# Patient Record
Sex: Female | Born: 1960
Health system: Southern US, Community
[De-identification: ages and names within clinical notes are randomized; demographics above are authoritative.]

## PROBLEM LIST (undated history)

## (undated) DIAGNOSIS — I2510 Atherosclerotic heart disease of native coronary artery without angina pectoris: Secondary | ICD-10-CM

## (undated) DIAGNOSIS — F32A Depression, unspecified: Secondary | ICD-10-CM

## (undated) DIAGNOSIS — E782 Mixed hyperlipidemia: Secondary | ICD-10-CM

## (undated) DIAGNOSIS — E119 Type 2 diabetes mellitus without complications: Secondary | ICD-10-CM

## (undated) DIAGNOSIS — I472 Ventricular tachycardia, unspecified: Secondary | ICD-10-CM

## (undated) DIAGNOSIS — I509 Heart failure, unspecified: Secondary | ICD-10-CM

## (undated) DIAGNOSIS — Z87891 Personal history of nicotine dependence: Secondary | ICD-10-CM

## (undated) DIAGNOSIS — I9589 Other hypotension: Secondary | ICD-10-CM

## (undated) DIAGNOSIS — I10 Essential (primary) hypertension: Secondary | ICD-10-CM

## (undated) DIAGNOSIS — Z95810 Presence of automatic (implantable) cardiac defibrillator: Secondary | ICD-10-CM

## (undated) DIAGNOSIS — F329 Major depressive disorder, single episode, unspecified: Secondary | ICD-10-CM

## (undated) DIAGNOSIS — K219 Gastro-esophageal reflux disease without esophagitis: Secondary | ICD-10-CM

## (undated) HISTORY — DX: Depression, unspecified: F32.A

## (undated) HISTORY — DX: Heart failure, unspecified: I50.9

## (undated) HISTORY — DX: Mixed hyperlipidemia: E78.2

## (undated) HISTORY — DX: Other hypotension: I95.89

## (undated) HISTORY — PX: CATARACT EXTRACTION: SUR2

## (undated) HISTORY — PX: ABDOMINAL HYSTERECTOMY: SHX81

## (undated) HISTORY — DX: Ventricular tachycardia: I47.2

## (undated) HISTORY — DX: Atherosclerotic heart disease of native coronary artery without angina pectoris: I25.10

## (undated) HISTORY — DX: Gastro-esophageal reflux disease without esophagitis: K21.9

## (undated) HISTORY — DX: Ventricular tachycardia, unspecified: I47.20

---
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
## 2016-01-25 DIAGNOSIS — E559 Vitamin D deficiency, unspecified: Secondary | ICD-10-CM | POA: Diagnosis not present

## 2016-01-25 DIAGNOSIS — Z0189 Encounter for other specified special examinations: Secondary | ICD-10-CM | POA: Diagnosis not present

## 2016-01-25 DIAGNOSIS — E782 Mixed hyperlipidemia: Secondary | ICD-10-CM | POA: Diagnosis not present

## 2016-01-25 DIAGNOSIS — I10 Essential (primary) hypertension: Secondary | ICD-10-CM | POA: Diagnosis not present

## 2016-01-25 DIAGNOSIS — E119 Type 2 diabetes mellitus without complications: Secondary | ICD-10-CM | POA: Diagnosis not present

## 2016-06-01 DIAGNOSIS — H04123 Dry eye syndrome of bilateral lacrimal glands: Secondary | ICD-10-CM | POA: Diagnosis not present

## 2016-06-01 DIAGNOSIS — Z961 Presence of intraocular lens: Secondary | ICD-10-CM | POA: Diagnosis not present

## 2016-06-01 DIAGNOSIS — E113392 Type 2 diabetes mellitus with moderate nonproliferative diabetic retinopathy without macular edema, left eye: Secondary | ICD-10-CM | POA: Diagnosis not present

## 2016-06-01 DIAGNOSIS — E113311 Type 2 diabetes mellitus with moderate nonproliferative diabetic retinopathy with macular edema, right eye: Secondary | ICD-10-CM | POA: Diagnosis not present

## 2016-10-18 DIAGNOSIS — H25043 Posterior subcapsular polar age-related cataract, bilateral: Secondary | ICD-10-CM | POA: Diagnosis not present

## 2016-10-18 DIAGNOSIS — E113311 Type 2 diabetes mellitus with moderate nonproliferative diabetic retinopathy with macular edema, right eye: Secondary | ICD-10-CM | POA: Diagnosis not present

## 2016-10-18 DIAGNOSIS — E113392 Type 2 diabetes mellitus with moderate nonproliferative diabetic retinopathy without macular edema, left eye: Secondary | ICD-10-CM | POA: Diagnosis not present

## 2017-02-01 DIAGNOSIS — L239 Allergic contact dermatitis, unspecified cause: Secondary | ICD-10-CM | POA: Diagnosis not present

## 2017-02-01 DIAGNOSIS — E119 Type 2 diabetes mellitus without complications: Secondary | ICD-10-CM | POA: Diagnosis not present

## 2017-02-01 DIAGNOSIS — E1165 Type 2 diabetes mellitus with hyperglycemia: Secondary | ICD-10-CM | POA: Diagnosis not present

## 2017-02-01 DIAGNOSIS — R5383 Other fatigue: Secondary | ICD-10-CM | POA: Diagnosis not present

## 2017-02-01 DIAGNOSIS — J060 Acute laryngopharyngitis: Secondary | ICD-10-CM | POA: Diagnosis not present

## 2017-03-14 DIAGNOSIS — H04123 Dry eye syndrome of bilateral lacrimal glands: Secondary | ICD-10-CM | POA: Diagnosis not present

## 2017-03-14 DIAGNOSIS — E113311 Type 2 diabetes mellitus with moderate nonproliferative diabetic retinopathy with macular edema, right eye: Secondary | ICD-10-CM | POA: Diagnosis not present

## 2017-03-14 DIAGNOSIS — Z961 Presence of intraocular lens: Secondary | ICD-10-CM | POA: Diagnosis not present

## 2017-03-14 DIAGNOSIS — E113392 Type 2 diabetes mellitus with moderate nonproliferative diabetic retinopathy without macular edema, left eye: Secondary | ICD-10-CM | POA: Diagnosis not present

## 2017-04-05 DIAGNOSIS — E113493 Type 2 diabetes mellitus with severe nonproliferative diabetic retinopathy without macular edema, bilateral: Secondary | ICD-10-CM | POA: Diagnosis not present

## 2017-05-24 DIAGNOSIS — E083413 Diabetes mellitus due to underlying condition with severe nonproliferative diabetic retinopathy with macular edema, bilateral: Secondary | ICD-10-CM | POA: Diagnosis not present

## 2017-05-24 DIAGNOSIS — E1165 Type 2 diabetes mellitus with hyperglycemia: Secondary | ICD-10-CM | POA: Diagnosis not present

## 2017-05-24 DIAGNOSIS — E782 Mixed hyperlipidemia: Secondary | ICD-10-CM | POA: Diagnosis not present

## 2017-05-24 DIAGNOSIS — K219 Gastro-esophageal reflux disease without esophagitis: Secondary | ICD-10-CM | POA: Diagnosis not present

## 2017-05-24 DIAGNOSIS — F17200 Nicotine dependence, unspecified, uncomplicated: Secondary | ICD-10-CM | POA: Diagnosis not present

## 2017-08-09 DIAGNOSIS — E113493 Type 2 diabetes mellitus with severe nonproliferative diabetic retinopathy without macular edema, bilateral: Secondary | ICD-10-CM | POA: Diagnosis not present

## 2017-08-22 DIAGNOSIS — Z23 Encounter for immunization: Secondary | ICD-10-CM | POA: Diagnosis not present

## 2017-08-22 DIAGNOSIS — F17200 Nicotine dependence, unspecified, uncomplicated: Secondary | ICD-10-CM | POA: Diagnosis not present

## 2017-08-22 DIAGNOSIS — Z1211 Encounter for screening for malignant neoplasm of colon: Secondary | ICD-10-CM | POA: Diagnosis not present

## 2017-08-22 DIAGNOSIS — Z0000 Encounter for general adult medical examination without abnormal findings: Secondary | ICD-10-CM | POA: Diagnosis not present

## 2017-08-22 DIAGNOSIS — Z1231 Encounter for screening mammogram for malignant neoplasm of breast: Secondary | ICD-10-CM | POA: Diagnosis not present

## 2017-09-04 DIAGNOSIS — E1165 Type 2 diabetes mellitus with hyperglycemia: Secondary | ICD-10-CM | POA: Diagnosis not present

## 2017-09-04 DIAGNOSIS — R202 Paresthesia of skin: Secondary | ICD-10-CM | POA: Diagnosis not present

## 2017-09-04 DIAGNOSIS — E782 Mixed hyperlipidemia: Secondary | ICD-10-CM | POA: Diagnosis not present

## 2017-09-04 DIAGNOSIS — F17200 Nicotine dependence, unspecified, uncomplicated: Secondary | ICD-10-CM | POA: Diagnosis not present

## 2017-09-04 DIAGNOSIS — K219 Gastro-esophageal reflux disease without esophagitis: Secondary | ICD-10-CM | POA: Diagnosis not present

## 2017-09-04 DIAGNOSIS — E083413 Diabetes mellitus due to underlying condition with severe nonproliferative diabetic retinopathy with macular edema, bilateral: Secondary | ICD-10-CM | POA: Diagnosis not present

## 2017-09-11 DIAGNOSIS — Z1231 Encounter for screening mammogram for malignant neoplasm of breast: Secondary | ICD-10-CM | POA: Diagnosis not present

## 2017-10-03 DIAGNOSIS — R921 Mammographic calcification found on diagnostic imaging of breast: Secondary | ICD-10-CM | POA: Diagnosis not present

## 2017-10-03 DIAGNOSIS — Z1231 Encounter for screening mammogram for malignant neoplasm of breast: Secondary | ICD-10-CM | POA: Diagnosis not present

## 2017-10-03 DIAGNOSIS — N6489 Other specified disorders of breast: Secondary | ICD-10-CM | POA: Diagnosis not present

## 2017-10-04 ENCOUNTER — Other Ambulatory Visit: Payer: Self-pay | Admitting: Family Medicine

## 2017-10-04 DIAGNOSIS — N6489 Other specified disorders of breast: Secondary | ICD-10-CM

## 2017-10-11 DIAGNOSIS — H5203 Hypermetropia, bilateral: Secondary | ICD-10-CM | POA: Diagnosis not present

## 2017-10-11 DIAGNOSIS — H04123 Dry eye syndrome of bilateral lacrimal glands: Secondary | ICD-10-CM | POA: Diagnosis not present

## 2017-10-11 DIAGNOSIS — E113493 Type 2 diabetes mellitus with severe nonproliferative diabetic retinopathy without macular edema, bilateral: Secondary | ICD-10-CM | POA: Diagnosis not present

## 2017-10-16 ENCOUNTER — Ambulatory Visit
Admission: RE | Admit: 2017-10-16 | Discharge: 2017-10-16 | Disposition: A | Discharge status: Home / Self Care | Payer: BLUE CROSS/BLUE SHIELD | Source: Ambulatory Visit | Attending: Family Medicine | Admitting: Family Medicine

## 2017-10-16 DIAGNOSIS — N6489 Other specified disorders of breast: Secondary | ICD-10-CM

## 2017-10-16 DIAGNOSIS — N6012 Diffuse cystic mastopathy of left breast: Secondary | ICD-10-CM | POA: Diagnosis not present

## 2017-11-28 DIAGNOSIS — E1165 Type 2 diabetes mellitus with hyperglycemia: Secondary | ICD-10-CM | POA: Diagnosis not present

## 2017-11-28 DIAGNOSIS — J018 Other acute sinusitis: Secondary | ICD-10-CM | POA: Diagnosis not present

## 2017-11-28 DIAGNOSIS — F4321 Adjustment disorder with depressed mood: Secondary | ICD-10-CM | POA: Diagnosis not present

## 2018-02-05 DIAGNOSIS — E1165 Type 2 diabetes mellitus with hyperglycemia: Secondary | ICD-10-CM | POA: Diagnosis not present

## 2018-02-05 DIAGNOSIS — K219 Gastro-esophageal reflux disease without esophagitis: Secondary | ICD-10-CM | POA: Diagnosis not present

## 2018-02-05 DIAGNOSIS — E083413 Diabetes mellitus due to underlying condition with severe nonproliferative diabetic retinopathy with macular edema, bilateral: Secondary | ICD-10-CM | POA: Diagnosis not present

## 2018-02-05 DIAGNOSIS — E782 Mixed hyperlipidemia: Secondary | ICD-10-CM | POA: Diagnosis not present

## 2018-03-08 DIAGNOSIS — F322 Major depressive disorder, single episode, severe without psychotic features: Secondary | ICD-10-CM | POA: Diagnosis not present

## 2018-03-08 DIAGNOSIS — E1165 Type 2 diabetes mellitus with hyperglycemia: Secondary | ICD-10-CM | POA: Diagnosis not present

## 2018-04-02 DIAGNOSIS — F320 Major depressive disorder, single episode, mild: Secondary | ICD-10-CM | POA: Diagnosis not present

## 2018-04-02 DIAGNOSIS — E1121 Type 2 diabetes mellitus with diabetic nephropathy: Secondary | ICD-10-CM | POA: Diagnosis not present

## 2018-08-02 DIAGNOSIS — E782 Mixed hyperlipidemia: Secondary | ICD-10-CM | POA: Diagnosis not present

## 2018-08-02 DIAGNOSIS — K219 Gastro-esophageal reflux disease without esophagitis: Secondary | ICD-10-CM | POA: Diagnosis not present

## 2018-08-02 DIAGNOSIS — I10 Essential (primary) hypertension: Secondary | ICD-10-CM | POA: Diagnosis not present

## 2018-08-02 DIAGNOSIS — E1121 Type 2 diabetes mellitus with diabetic nephropathy: Secondary | ICD-10-CM | POA: Diagnosis not present

## 2018-08-02 DIAGNOSIS — E083413 Diabetes mellitus due to underlying condition with severe nonproliferative diabetic retinopathy with macular edema, bilateral: Secondary | ICD-10-CM | POA: Diagnosis not present

## 2018-10-09 DIAGNOSIS — K219 Gastro-esophageal reflux disease without esophagitis: Secondary | ICD-10-CM | POA: Insufficient documentation

## 2018-10-09 DIAGNOSIS — I219 Acute myocardial infarction, unspecified: Secondary | ICD-10-CM

## 2018-10-09 HISTORY — DX: Acute myocardial infarction, unspecified: I21.9

## 2018-10-14 DIAGNOSIS — Z0000 Encounter for general adult medical examination without abnormal findings: Secondary | ICD-10-CM | POA: Diagnosis not present

## 2018-10-14 DIAGNOSIS — Z1211 Encounter for screening for malignant neoplasm of colon: Secondary | ICD-10-CM | POA: Diagnosis not present

## 2018-10-14 DIAGNOSIS — Z1231 Encounter for screening mammogram for malignant neoplasm of breast: Secondary | ICD-10-CM | POA: Diagnosis not present

## 2018-11-07 DIAGNOSIS — R920 Mammographic microcalcification found on diagnostic imaging of breast: Secondary | ICD-10-CM | POA: Diagnosis not present

## 2018-11-07 DIAGNOSIS — R928 Other abnormal and inconclusive findings on diagnostic imaging of breast: Secondary | ICD-10-CM | POA: Diagnosis not present

## 2018-11-12 DIAGNOSIS — I10 Essential (primary) hypertension: Secondary | ICD-10-CM | POA: Diagnosis not present

## 2018-11-12 DIAGNOSIS — E1121 Type 2 diabetes mellitus with diabetic nephropathy: Secondary | ICD-10-CM | POA: Diagnosis not present

## 2018-11-12 DIAGNOSIS — E083413 Diabetes mellitus due to underlying condition with severe nonproliferative diabetic retinopathy with macular edema, bilateral: Secondary | ICD-10-CM | POA: Diagnosis not present

## 2018-11-12 DIAGNOSIS — E782 Mixed hyperlipidemia: Secondary | ICD-10-CM | POA: Diagnosis not present

## 2018-11-12 DIAGNOSIS — K219 Gastro-esophageal reflux disease without esophagitis: Secondary | ICD-10-CM | POA: Diagnosis not present

## 2019-03-07 DIAGNOSIS — E1169 Type 2 diabetes mellitus with other specified complication: Secondary | ICD-10-CM | POA: Diagnosis not present

## 2019-03-07 DIAGNOSIS — E782 Mixed hyperlipidemia: Secondary | ICD-10-CM | POA: Diagnosis not present

## 2019-03-07 DIAGNOSIS — K219 Gastro-esophageal reflux disease without esophagitis: Secondary | ICD-10-CM | POA: Diagnosis not present

## 2019-03-07 DIAGNOSIS — E1121 Type 2 diabetes mellitus with diabetic nephropathy: Secondary | ICD-10-CM | POA: Diagnosis not present

## 2019-03-07 DIAGNOSIS — E083413 Diabetes mellitus due to underlying condition with severe nonproliferative diabetic retinopathy with macular edema, bilateral: Secondary | ICD-10-CM | POA: Diagnosis not present

## 2019-03-07 DIAGNOSIS — I10 Essential (primary) hypertension: Secondary | ICD-10-CM | POA: Diagnosis not present

## 2019-03-10 ENCOUNTER — Other Ambulatory Visit: Payer: Self-pay

## 2019-03-10 ENCOUNTER — Encounter (HOSPITAL_COMMUNITY): Payer: Self-pay

## 2019-03-10 ENCOUNTER — Inpatient Hospital Stay (HOSPITAL_COMMUNITY)
Admission: AD | Admit: 2019-03-10 | Discharge: 2019-03-15 | DRG: 224 | Disposition: A | Discharge status: Home / Self Care | Payer: BC Managed Care – PPO | Source: Other Acute Inpatient Hospital | Attending: Cardiology | Admitting: Cardiology

## 2019-03-10 DIAGNOSIS — I2510 Atherosclerotic heart disease of native coronary artery without angina pectoris: Secondary | ICD-10-CM | POA: Diagnosis not present

## 2019-03-10 DIAGNOSIS — I255 Ischemic cardiomyopathy: Secondary | ICD-10-CM | POA: Diagnosis not present

## 2019-03-10 DIAGNOSIS — Z885 Allergy status to narcotic agent status: Secondary | ICD-10-CM | POA: Diagnosis not present

## 2019-03-10 DIAGNOSIS — Z79899 Other long term (current) drug therapy: Secondary | ICD-10-CM

## 2019-03-10 DIAGNOSIS — E1165 Type 2 diabetes mellitus with hyperglycemia: Secondary | ICD-10-CM | POA: Diagnosis not present

## 2019-03-10 DIAGNOSIS — I119 Hypertensive heart disease without heart failure: Secondary | ICD-10-CM | POA: Diagnosis not present

## 2019-03-10 DIAGNOSIS — Z87891 Personal history of nicotine dependence: Secondary | ICD-10-CM | POA: Insufficient documentation

## 2019-03-10 DIAGNOSIS — I5041 Acute combined systolic (congestive) and diastolic (congestive) heart failure: Secondary | ICD-10-CM | POA: Diagnosis not present

## 2019-03-10 DIAGNOSIS — D72829 Elevated white blood cell count, unspecified: Secondary | ICD-10-CM | POA: Diagnosis not present

## 2019-03-10 DIAGNOSIS — R7989 Other specified abnormal findings of blood chemistry: Secondary | ICD-10-CM | POA: Diagnosis not present

## 2019-03-10 DIAGNOSIS — I9589 Other hypotension: Secondary | ICD-10-CM | POA: Diagnosis not present

## 2019-03-10 DIAGNOSIS — I214 Non-ST elevation (NSTEMI) myocardial infarction: Secondary | ICD-10-CM | POA: Diagnosis not present

## 2019-03-10 DIAGNOSIS — Z91013 Allergy to seafood: Secondary | ICD-10-CM | POA: Diagnosis not present

## 2019-03-10 DIAGNOSIS — Z87892 Personal history of anaphylaxis: Secondary | ICD-10-CM

## 2019-03-10 DIAGNOSIS — Z95818 Presence of other cardiac implants and grafts: Secondary | ICD-10-CM

## 2019-03-10 DIAGNOSIS — K219 Gastro-esophageal reflux disease without esophagitis: Secondary | ICD-10-CM | POA: Diagnosis present

## 2019-03-10 DIAGNOSIS — I252 Old myocardial infarction: Secondary | ICD-10-CM

## 2019-03-10 DIAGNOSIS — E860 Dehydration: Secondary | ICD-10-CM | POA: Diagnosis not present

## 2019-03-10 DIAGNOSIS — E785 Hyperlipidemia, unspecified: Secondary | ICD-10-CM | POA: Diagnosis not present

## 2019-03-10 DIAGNOSIS — R000 Tachycardia, unspecified: Secondary | ICD-10-CM | POA: Diagnosis not present

## 2019-03-10 DIAGNOSIS — R197 Diarrhea, unspecified: Secondary | ICD-10-CM | POA: Diagnosis not present

## 2019-03-10 DIAGNOSIS — F17210 Nicotine dependence, cigarettes, uncomplicated: Secondary | ICD-10-CM | POA: Diagnosis not present

## 2019-03-10 DIAGNOSIS — I472 Ventricular tachycardia, unspecified: Secondary | ICD-10-CM

## 2019-03-10 DIAGNOSIS — R112 Nausea with vomiting, unspecified: Secondary | ICD-10-CM | POA: Diagnosis not present

## 2019-03-10 DIAGNOSIS — J90 Pleural effusion, not elsewhere classified: Secondary | ICD-10-CM | POA: Diagnosis not present

## 2019-03-10 DIAGNOSIS — E669 Obesity, unspecified: Secondary | ICD-10-CM | POA: Diagnosis not present

## 2019-03-10 DIAGNOSIS — N179 Acute kidney failure, unspecified: Secondary | ICD-10-CM | POA: Diagnosis present

## 2019-03-10 DIAGNOSIS — Z794 Long term (current) use of insulin: Secondary | ICD-10-CM | POA: Diagnosis not present

## 2019-03-10 DIAGNOSIS — I959 Hypotension, unspecified: Secondary | ICD-10-CM | POA: Diagnosis not present

## 2019-03-10 DIAGNOSIS — Z7984 Long term (current) use of oral hypoglycemic drugs: Secondary | ICD-10-CM | POA: Diagnosis not present

## 2019-03-10 DIAGNOSIS — R9431 Abnormal electrocardiogram [ECG] [EKG]: Secondary | ICD-10-CM | POA: Diagnosis not present

## 2019-03-10 DIAGNOSIS — I10 Essential (primary) hypertension: Secondary | ICD-10-CM | POA: Diagnosis not present

## 2019-03-10 DIAGNOSIS — J018 Other acute sinusitis: Secondary | ICD-10-CM | POA: Diagnosis not present

## 2019-03-10 DIAGNOSIS — Z03818 Encounter for observation for suspected exposure to other biological agents ruled out: Secondary | ICD-10-CM | POA: Diagnosis not present

## 2019-03-10 DIAGNOSIS — R740 Nonspecific elevation of levels of transaminase and lactic acid dehydrogenase [LDH]: Secondary | ICD-10-CM | POA: Diagnosis not present

## 2019-03-10 DIAGNOSIS — R079 Chest pain, unspecified: Secondary | ICD-10-CM | POA: Diagnosis not present

## 2019-03-10 DIAGNOSIS — Z950 Presence of cardiac pacemaker: Secondary | ICD-10-CM | POA: Diagnosis not present

## 2019-03-10 DIAGNOSIS — I739 Peripheral vascular disease, unspecified: Secondary | ICD-10-CM | POA: Diagnosis present

## 2019-03-10 DIAGNOSIS — I519 Heart disease, unspecified: Secondary | ICD-10-CM | POA: Diagnosis not present

## 2019-03-10 DIAGNOSIS — Z8249 Family history of ischemic heart disease and other diseases of the circulatory system: Secondary | ICD-10-CM | POA: Diagnosis not present

## 2019-03-10 DIAGNOSIS — F17200 Nicotine dependence, unspecified, uncomplicated: Secondary | ICD-10-CM | POA: Insufficient documentation

## 2019-03-10 DIAGNOSIS — I499 Cardiac arrhythmia, unspecified: Secondary | ICD-10-CM | POA: Diagnosis not present

## 2019-03-10 HISTORY — DX: Type 2 diabetes mellitus without complications: E11.9

## 2019-03-10 HISTORY — DX: Personal history of nicotine dependence: Z87.891

## 2019-03-10 HISTORY — DX: Essential (primary) hypertension: I10

## 2019-03-10 LAB — CBC WITH DIFFERENTIAL/PLATELET
Abs Immature Granulocytes: 0.04 10*3/uL (ref 0.00–0.07)
Basophils Absolute: 0.1 10*3/uL (ref 0.0–0.1)
Basophils Relative: 1 %
Eosinophils Absolute: 0.1 10*3/uL (ref 0.0–0.5)
Eosinophils Relative: 0 %
HCT: 40.4 % (ref 36.0–46.0)
Hemoglobin: 13.2 g/dL (ref 12.0–15.0)
Immature Granulocytes: 0 %
Lymphocytes Relative: 29 %
Lymphs Abs: 3.3 10*3/uL (ref 0.7–4.0)
MCH: 28.8 pg (ref 26.0–34.0)
MCHC: 32.7 g/dL (ref 30.0–36.0)
MCV: 88 fL (ref 80.0–100.0)
Monocytes Absolute: 0.9 10*3/uL (ref 0.1–1.0)
Monocytes Relative: 8 %
Neutro Abs: 7.1 10*3/uL (ref 1.7–7.7)
Neutrophils Relative %: 62 %
Platelets: 309 10*3/uL (ref 150–400)
RBC: 4.59 MIL/uL (ref 3.87–5.11)
RDW: 14.2 % (ref 11.5–15.5)
WBC: 11.3 10*3/uL — ABNORMAL HIGH (ref 4.0–10.5)
nRBC: 0 % (ref 0.0–0.2)

## 2019-03-10 LAB — BASIC METABOLIC PANEL
Anion gap: 16 — ABNORMAL HIGH (ref 5–15)
BUN: 40 mg/dL — ABNORMAL HIGH (ref 6–20)
CO2: 20 mmol/L — ABNORMAL LOW (ref 22–32)
Calcium: 8.3 mg/dL — ABNORMAL LOW (ref 8.9–10.3)
Chloride: 98 mmol/L (ref 98–111)
Creatinine, Ser: 2.57 mg/dL — ABNORMAL HIGH (ref 0.44–1.00)
GFR calc Af Amer: 23 mL/min — ABNORMAL LOW (ref >60)
GFR calc non Af Amer: 20 mL/min — ABNORMAL LOW (ref >60)
Glucose, Bld: 165 mg/dL — ABNORMAL HIGH (ref 70–99)
Potassium: 3.8 mmol/L (ref 3.5–5.1)
Sodium: 134 mmol/L — ABNORMAL LOW (ref 135–145)

## 2019-03-10 LAB — MAGNESIUM: Magnesium: 1.6 mg/dL — ABNORMAL LOW (ref 1.7–2.4)

## 2019-03-10 LAB — GLUCOSE, CAPILLARY
Glucose-Capillary: 139 mg/dL — ABNORMAL HIGH (ref 70–99)
Glucose-Capillary: 190 mg/dL — ABNORMAL HIGH (ref 70–99)

## 2019-03-10 LAB — HEPARIN LEVEL (UNFRACTIONATED): Heparin Unfractionated: <0.1 IU/mL — ABNORMAL LOW (ref 0.30–0.70)

## 2019-03-10 LAB — TROPONIN I: Troponin I: 1.87 ng/mL (ref <0.03)

## 2019-03-10 MED ORDER — AMIODARONE HCL IN DEXTROSE 360-4.14 MG/200ML-% IV SOLN
30.0000 mg/h | INTRAVENOUS | Status: DC
  Administered 2019-03-11 – 2019-03-12 (×3): 30 mg/h via INTRAVENOUS
  Filled 2019-03-10 (×3): qty 200

## 2019-03-10 MED ORDER — INSULIN ASPART 100 UNIT/ML ~~LOC~~ SOLN
0.0000 [IU] | Freq: Three times a day (TID) | SUBCUTANEOUS | Status: DC
  Administered 2019-03-11: 2 [IU] via SUBCUTANEOUS
  Administered 2019-03-11: 1 [IU] via SUBCUTANEOUS
  Administered 2019-03-12: 2 [IU] via SUBCUTANEOUS
  Administered 2019-03-12: 3 [IU] via SUBCUTANEOUS
  Administered 2019-03-13 (×2): 2 [IU] via SUBCUTANEOUS
  Administered 2019-03-14: 3 [IU] via SUBCUTANEOUS
  Administered 2019-03-14 (×2): 1 [IU] via SUBCUTANEOUS
  Administered 2019-03-15: 3 [IU] via SUBCUTANEOUS
  Administered 2019-03-15: 1 [IU] via SUBCUTANEOUS

## 2019-03-10 MED ORDER — AMIODARONE HCL IN DEXTROSE 360-4.14 MG/200ML-% IV SOLN
60.0000 mg/h | INTRAVENOUS | Status: AC
  Administered 2019-03-11: 60 mg/h via INTRAVENOUS

## 2019-03-10 MED ORDER — HEPARIN BOLUS VIA INFUSION
2000.0000 [IU] | Freq: Once | INTRAVENOUS | Status: AC
  Administered 2019-03-10: 2000 [IU] via INTRAVENOUS
  Filled 2019-03-10: qty 2000

## 2019-03-10 MED ORDER — HEPARIN (PORCINE) 25000 UT/250ML-% IV SOLN
1500.0000 [IU]/h | INTRAVENOUS | Status: DC
  Administered 2019-03-11: 1200 [IU]/h via INTRAVENOUS
  Administered 2019-03-12: 1500 [IU]/h via INTRAVENOUS
  Filled 2019-03-10 (×2): qty 250

## 2019-03-10 NOTE — H&P (Addendum)
Cardiology Admission History and Physical:   Patient ID: Melinda Nguyen MRN: [id number]; DOB: Jul 29, 1961   Admission date: 03/10/2019  Primary Care Provider: Blane Ohara, MD Primary Cardiologist: New Primary Electrophysiologist:  None   Chief Complaint:  Transfer From [facility] for WCT  Patient Profile:   Melinda Nguyen is a 58 y.o. female with h/o poorly controlled T2DM, tobacco use, HLD, obesity and GERD, transferred from [facility] to [facility] for WCT.   History of Present Illness:   Melinda Nguyen has no prior cardiac history. Cardiac risk factors include poorly controlled DM, recently started on Lantus, HLD and tobacco use. Smoker for 30 + years. Smokes < 1/2 ppd. Has been under a great deal of stress lately. Her husband has pancreatic cancer and her nephew passed away last week from drug overdose.   She developed vomiting and diarrhea several days ago and went to see PCP. She had labs drawn and found to have leukocytosis and abnormal LFTs. Blood sugar also elevated in the 400s and pt started on lantus. She had an appt for f/u labs today at PCP office. She felt weak and noted to have low BP. She denied CP, dyspnea, dizziness, syncope/ near syncope. Subsequent EKG showed VT w/ rate in the 200s. Sent to [place] hospital. She given amiodarone bolus and drip. Did not improve. She got diaphoretic and BP continued to drop. She was sedated and cardioverted w/ 50 J. Per records from [place], electrolytes were ok but noted to have acute renal insuffiencey. SCr was 2.0 (normal 2 days prior). Troponin was 1.7. BNP also elevated at [place] at [address]. Pt transferred to [facility] for further evaluation. She is stable on arrival. CP free. NSR. BP in the low 90s systolic.    Past Medical History:  Diagnosis Date   Smoking history 03/10/2019   Smokes 1/2 pack a day    History reviewed. No pertinent surgical history.   Medications Prior to Admission: Prior to Admission medications   Not on File     Allergies:    Allergies  Allergen Reactions   Hydrocodone Hives    Social History:   Social History   Socioeconomic History   Marital status: Single    Spouse name: Not on file   Number of children: Not on file   Years of education: Not on file   Highest education level: Not on file  Occupational History   Not on file  Social Needs   Financial resource strain: Not on file   Food insecurity:    Worry: Not on file    Inability: Not on file   Transportation needs:    Medical: Not on file    Non-medical: Not on file  Tobacco Use   Smoking status: Not on file  Substance and Sexual Activity   Alcohol use: Not on file   Drug use: Not on file   Sexual activity: Not on file  Lifestyle   Physical activity:    Days per week: Not on file    Minutes per session: Not on file   Stress: Not on file  Relationships   Social connections:    Talks on phone: Not on file    Gets together: Not on file    Attends religious service: Not on file    Active member of club or organization: Not on file    Attends meetings of clubs or organizations: Not on file    Relationship status: Not on file   Intimate partner violence:  Fear of current or ex partner: Not on file    Emotionally abused: Not on file    Physically abused: Not on file    Forced sexual activity: Not on file  Other Topics Concern   Not on file  Social History Narrative   Not on file    Family History:   The patient's family history includes Hypertension in her mother.    ROS:  Please see the history of present illness.  All other ROS reviewed and negative.     Physical Exam/Data:   Vitals:   03/10/19 1745 03/10/19 1800 03/10/19 1815 03/10/19 1832  BP: 95/67 92/65 97/72  97/72  Pulse:    78  Resp: (!) 25 18 18 18   Temp: (!) 97.5 F (36.4 C)   (!) 97.5 F (36.4 C)  TempSrc: Axillary   Axillary  SpO2: 93% 93% 92%    No intake or output data in the 24 hours ending 03/10/19 1846 No  flowsheet data found.   There is no height or weight on file to calculate BMI.  General:  Well nourished, well developed, in no acute distress HEENT: normal Lymph: no adenopathy Neck: no JVD Endocrine:  No thryomegaly Vascular: No carotid bruits; decreased peripheral pulses Cardiac:  normal S1, S2; RRR; no murmur  Lungs: bibasilar crackles on exam.  Abd: soft, nontender, no hepatomegaly  Ext: no edema Musculoskeletal:  No deformities, BUE and BLE strength normal and equal Skin: warm and dry  Neuro:  CNs 2-12 intact, no focal abnormalities noted Psych:  Normal affect    EKG:  The ECG that was done at [facility] hospital was personally reviewed and demonstrates ventricular tachycardia 220 bpm   Relevant CV Studies: None   Laboratory Data:  ChemistryNo results for input(s): NA, K, CL, CO2, GLUCOSE, BUN, CREATININE, CALCIUM, GFRNONAA, GFRAA, ANIONGAP in the last 168 hours.  No results for input(s): PROT, ALBUMIN, AST, ALT, ALKPHOS, BILITOT in the last 168 hours. HematologyNo results for input(s): WBC, RBC, HGB, HCT, MCV, MCH, MCHC, RDW, PLT in the last 168 hours. Cardiac EnzymesNo results for input(s): TROPONINI in the last 168 hours. No results for input(s): TROPIPOC in the last 168 hours.  BNPNo results for input(s): BNP, PROBNP in the last 168 hours.  DDimer No results for input(s): DDIMER in the last 168 hours.  Radiology/Studies:  No results found.  Assessment and Plan:   Melinda Nguyen is a 58 y.o. female with h/o T2DM, tobacco use, HLD, obesity and GERD, transferred from [facility] to [facility] for WCT.   1. VT: s/p emergent cardioversion at [facility]. Currently in NSR and remains on amiodarone drip. Denies any recent CP or dyspnea. Electrolytes at outside hospital ok. Troponin was abnormal at 1.7. BNP 16200. Will keep in ICU. Start IV heparin. Monitor on tele. Keep on amiodarone. Pressures are soft. Will continue to hold home antihypertensives.  Recheck CBC, BMP, Mg  and cycle troponins x 3. Will get 2D echo. Given her poorly controlled DM and tobacco use, she will need a cardiac cath to r/o underlying ischemia, pending renal function. Will recheck BMP to see if SCr has improved (had AKI w/ SCr at 2). NPO at midnight.   For questions or updates, please contact [facility] Please consult [email or web address] for contact info under        Signed, Melinda Lis, PA-C  03/10/2019 6:46 PM   History and all data above reviewed.  Patient examined.  I agree with the findings as above.  Patient without  prior history.  She has poorly controlled diabetes.  She said on Thursday she was having nausea and vomiting.  She did see her primary provider.  I note then that her creatinine was okay.  She had persistent episodes of nausea and vomiting with some diarrhea over the weekend.  She went to her primary provider today and was noted to have wide-complex tachycardia.  I reviewed the notes from [facility].  While she was in the emergency room her blood pressure kept drifting down.  She eventually had synchronized cardioversion after sedation from wide-complex tachycardia with resultant normal sinus rhythm.  Her troponin is mildly elevated as above.  BNP was elevated.  Creatinine is now up to 2.0.  She actually felt a little weak on her way to the emergency room and in the doctor's office but did not have any presyncope or syncope.  She is not had any chest pressure, neck or arm discomfort.  She has had no edema.  She is never had any prior cardiac work-up.  The patient exam reveals COR: Regular rhythm, no murmurs.,  Lungs: Decreased breath sounds with bilateral crackles,  Abd: Positive bowel sounds no rebound or guarding, Ext absent dorsalis pedis and posterior tibialis pulses bilaterally.  All available labs, radiology testing, previous records reviewed. Agree with documented assessment and plan.  Ventricular tachycardia: The patient has evidence for ventricular tachycardia.   Etiology might be ischemic.  She is medically heparinized.  We will check an echocardiogram.  She will need an ischemia work-up with cardiac catheterization once her creatinine is normalized.  AKI: Creatinine is elevated likely secondary to hypotension over the weekend and also losartan is relatively new medication.  She is also probably dehydrated.  Will avoid diuretics and ARB and follow her creatinine.  She might need gentle hydration although I am worried that she has a reduced ejection fraction which will be evaluated as above.  ELEVATED TROPONIN: She was started on heparin, aspirin troponins will be cycled.  Baseline EKG post cardioversion demonstrates no acute ST segment changes.   DM: Continue outpatient regimen.  RISK REDUCTION: Statin.   PVD: I suspect PVD by exam and by history of claudication.    Melinda Nguyen  6:51 PM  03/10/2019

## 2019-03-10 NOTE — Progress Notes (Signed)
Direct Admission: Patient arrived via stretcher from [facility]. Alert and oriented  X 4. Afebrile. Currently on Amiodarone and Heparin drips. Fluid hydration with Lactated Ringers continues as well. Lung fields: clear. Abdomen soft to touch. Unit introduction provided. Safety precautions initiated. Call bell within reach. Encouraged to report signs and symptoms to staff.

## 2019-03-10 NOTE — Progress Notes (Addendum)
ANTICOAGULATION CONSULT NOTE - Initial Consult  Pharmacy Consult for heparin  Indication: chest pain/ACS  Allergies  Allergen Reactions  . Hydrocodone Hives    Patient Measurements: Height: 5\' 4"  (162.6 cm) Weight: 177 lb 7.5 oz (80.5 kg) IBW/kg (Calculated) : 54.7 Heparin Dosing Weight: 72kg  Vital Signs: Temp: 97.5 F (36.4 C) (06/01 1832) Temp Source: Axillary (06/01 1832) BP: 95/65 (06/01 1930) Pulse Rate: 78 (06/01 1832)  Labs: No results for input(s): HGB, HCT, PLT, APTT, LABPROT, INR, HEPARINUNFRC, HEPRLOWMOCWT, CREATININE, CKTOTAL, CKMB, TROPONINI in the last 72 hours.  CrCl cannot be calculated (No successful lab value found.).   Medical History: Past Medical History:  Diagnosis Date  . Smoking history 03/10/2019   Smokes 1/2 pack a day    Medications:  No medications prior to admission.   Scheduled:  . [START ON 03/11/2019] insulin aspart  0-9 Units Subcutaneous TID WC    Assessment: 58 yo female from [facility] hospital with VT s/p emergent cardioversion. Pharmacy consulted to dose heparin for r/o ACS.  He is currently on heparin at 800 units/hr (started ~ 1:30pm) -labs at [facility]: gf= 14.7, plt= 442, INR= 1.1  Goal of Therapy:  Heparin level 0.3-0.7 units/ml Monitor platelets by anticoagulation protocol: Yes   Plan:   -Continue heparin at 800 units/hr -Check heparin level -Daily heparin level and CBC  Harland German, PharmD Clinical Pharmacist **Pharmacist phone directory can now be found on [email or web address] ([email or web address]).  Listed under [facility].   Addendum -heparin level < 0.1 (4000 unit bolus was given at [facility] ~ 1:30pm)  Plan -Heparin bolus 2000 units x1 then increase to 1000 units/hr -Heparin level in 8 hours and daily wth CBC daily  Harland German, PharmD Clinical Pharmacist **Pharmacist phone directory can now be found on [email or web address] ([email or web address]).  Listed under [facility].

## 2019-03-11 ENCOUNTER — Encounter (HOSPITAL_COMMUNITY): Payer: Self-pay

## 2019-03-11 ENCOUNTER — Inpatient Hospital Stay (HOSPITAL_COMMUNITY): Payer: BC Managed Care – PPO

## 2019-03-11 DIAGNOSIS — R9431 Abnormal electrocardiogram [ECG] [EKG]: Secondary | ICD-10-CM

## 2019-03-11 LAB — GLUCOSE, CAPILLARY
Glucose-Capillary: 126 mg/dL — ABNORMAL HIGH (ref 70–99)
Glucose-Capillary: 168 mg/dL — ABNORMAL HIGH (ref 70–99)
Glucose-Capillary: 116 mg/dL — ABNORMAL HIGH (ref 70–99)
Glucose-Capillary: 213 mg/dL — ABNORMAL HIGH (ref 70–99)

## 2019-03-11 LAB — HEPARIN LEVEL (UNFRACTIONATED)
Heparin Unfractionated: <0.1 IU/mL — ABNORMAL LOW (ref 0.30–0.70)
Heparin Unfractionated: 0.17 IU/mL — ABNORMAL LOW (ref 0.30–0.70)

## 2019-03-11 LAB — CBC
HCT: 40.6 % (ref 36.0–46.0)
Hemoglobin: 13.2 g/dL (ref 12.0–15.0)
MCH: 28.8 pg (ref 26.0–34.0)
MCHC: 32.5 g/dL (ref 30.0–36.0)
MCV: 88.5 fL (ref 80.0–100.0)
Platelets: 332 10*3/uL (ref 150–400)
RBC: 4.59 MIL/uL (ref 3.87–5.11)
RDW: 14.3 % (ref 11.5–15.5)
WBC: 10.2 10*3/uL (ref 4.0–10.5)
nRBC: 0 % (ref 0.0–0.2)

## 2019-03-11 LAB — BASIC METABOLIC PANEL
Anion gap: 15 (ref 5–15)
BUN: 41 mg/dL — ABNORMAL HIGH (ref 6–20)
CO2: 21 mmol/L — ABNORMAL LOW (ref 22–32)
Calcium: 8.1 mg/dL — ABNORMAL LOW (ref 8.9–10.3)
Chloride: 96 mmol/L — ABNORMAL LOW (ref 98–111)
Creatinine, Ser: 2.73 mg/dL — ABNORMAL HIGH (ref 0.44–1.00)
GFR calc Af Amer: 21 mL/min — ABNORMAL LOW (ref >60)
GFR calc non Af Amer: 18 mL/min — ABNORMAL LOW (ref >60)
Glucose, Bld: 125 mg/dL — ABNORMAL HIGH (ref 70–99)
Potassium: 5.4 mmol/L — ABNORMAL HIGH (ref 3.5–5.1)
Sodium: 132 mmol/L — ABNORMAL LOW (ref 135–145)

## 2019-03-11 LAB — LIPID PANEL
Cholesterol: 160 mg/dL (ref 0–200)
HDL: 43 mg/dL (ref >40)
LDL Cholesterol: 95 mg/dL (ref 0–99)
Total CHOL/HDL Ratio: 3.7 RATIO
Triglycerides: 111 mg/dL (ref <150)
VLDL: 22 mg/dL (ref 0–40)

## 2019-03-11 LAB — TROPONIN I
Troponin I: 1.89 ng/mL (ref <0.03)
Troponin I: 1.94 ng/mL (ref <0.03)

## 2019-03-11 LAB — ECHOCARDIOGRAM COMPLETE
Height: 64 in
Weight: 2839.52 oz

## 2019-03-11 LAB — MRSA PCR SCREENING: MRSA by PCR: NEGATIVE

## 2019-03-11 MED ORDER — SODIUM CHLORIDE 0.9% FLUSH: 3.0000 mL | Freq: Two times a day (BID) | INTRAVENOUS | Status: DC

## 2019-03-11 MED ORDER — SODIUM CHLORIDE 0.9 % WEIGHT BASED INFUSION
1.0000 mL/kg/h | INTRAVENOUS | Status: DC
  Administered 2019-03-11 – 2019-03-12 (×2): 1 mL/kg/h via INTRAVENOUS

## 2019-03-11 MED ORDER — ASPIRIN 81 MG PO CHEW
81.0000 mg | CHEWABLE_TABLET | Freq: Once | ORAL | Status: AC
  Administered 2019-03-11: 81 mg via ORAL
  Filled 2019-03-11: qty 1

## 2019-03-11 MED ORDER — ATORVASTATIN CALCIUM 80 MG PO TABS
80.0000 mg | ORAL_TABLET | Freq: Every day | ORAL | Status: DC
  Administered 2019-03-11 – 2019-03-14 (×4): 80 mg via ORAL
  Filled 2019-03-11 (×4): qty 1

## 2019-03-11 MED ORDER — CHLORHEXIDINE GLUCONATE CLOTH 2 % EX PADS
6.0000 | MEDICATED_PAD | Freq: Every day | CUTANEOUS | Status: DC
  Administered 2019-03-11 – 2019-03-15 (×5): 6 via TOPICAL

## 2019-03-11 MED ORDER — SODIUM CHLORIDE 0.9 % IV SOLN: 250.0000 mL | INTRAVENOUS | Status: DC | PRN

## 2019-03-11 MED ORDER — ASPIRIN 81 MG PO CHEW
81.0000 mg | CHEWABLE_TABLET | ORAL | Status: AC
  Administered 2019-03-12: 81 mg via ORAL
  Filled 2019-03-11: qty 1

## 2019-03-11 MED ORDER — SODIUM CHLORIDE 0.9% FLUSH: 3.0000 mL | INTRAVENOUS | Status: DC | PRN

## 2019-03-11 NOTE — Progress Notes (Signed)
Progress Note  Patient Name: Melinda Nguyen Date of Encounter: 03/11/2019  Primary Cardiologist:   No primary care provider on file.   Subjective   She denies chest pain or SOB.    Inpatient Medications    Scheduled Meds: . insulin aspart  0-9 Units Subcutaneous TID WC   Continuous Infusions: . amiodarone 30 mg/hr (03/11/19 0700)  . heparin 1,000 Units/hr (03/11/19 0700)   PRN Meds:    Vital Signs    Vitals:   03/11/19 0500 03/11/19 0600 03/11/19 0635 03/11/19 0700  BP: 119/77 (!) 124/105  (!) 97/55  Pulse:      Resp: (!) 23   (!) 22  Temp:   98.2 F (36.8 C)   TempSrc:   Oral   SpO2: 96% 98%  96%  Weight:      Height:        Intake/Output Summary (Last 24 hours) at 03/11/2019 0851 Last data filed at 03/11/2019 0700 Gross per 24 hour  Intake 382.05 ml  Output -  Net 382.05 ml   Filed Weights   03/10/19 1846  Weight: 80.5 kg    Telemetry    NSR - Personally Reviewed  ECG    NA - Personally Reviewed  Physical Exam   GEN: No acute distress.   Neck: No  JVD Cardiac: RRR, no murmurs, rubs, or gallops.  Respiratory: Clear  to auscultation bilaterally. GI: Soft, nontender, non-distended  MS: No  edema; No deformity. Neuro:  Nonfocal  Psych: Normal affect   Labs    Chemistry Recent Labs  Lab 03/10/19 1928 03/11/19 0623  NA 134* 132*  K 3.8 5.4*  CL 98 96*  CO2 20* 21*  GLUCOSE 165* 125*  BUN 40* 41*  CREATININE 2.57* 2.73*  CALCIUM 8.3* 8.1*  GFRNONAA 20* 18*  GFRAA 23* 21*  ANIONGAP 16* 15     Hematology Recent Labs  Lab 03/10/19 1928 03/11/19 0623  WBC 11.3* 10.2  RBC 4.59 4.59  HGB 13.2 13.2  HCT 40.4 40.6  MCV 88.0 88.5  MCH 28.8 28.8  MCHC 32.7 32.5  RDW 14.2 14.3  PLT 309 332    Cardiac Enzymes Recent Labs  Lab 03/10/19 1928 03/11/19 0101 03/11/19 0623  TROPONINI 1.87* 1.89* 1.94*   No results for input(s): TROPIPOC in the last 168 hours.   BNPNo results for input(s): BNP, PROBNP in the last 168 hours.    DDimer No results for input(s): DDIMER in the last 168 hours.   Radiology    No results found.  Cardiac Studies   Echo pending  Patient Profile     58 y.o. female without prior cardiac history who has longstanding tobacco abuse and poorly controlled DM who presented to her primary care with wide complex tach.  She was cardioverted and [facility] ED.  EKG consistent with ventricular tachycardia  Assessment & Plan    AKI:   I suspect ATN from hypotension and dehydration.  I will follow and avoid nephrotoxic agents.  Cannot cath at this point.  Consider renal ultrasound pending follow up creatinine.  Losartan on hold.      ELEVATED TROPONIN:  I suspect ischemic heart disease.  However, this mild trop elevation might be secondary to her tachycardia.  Continue heparin and ASA.    VENTRICULAR TACHYCARDIA:    Echo this morning.  She needs a cardiac cath eventually.    DM:  Restart home insulin with SSI.    TOBACCO ABUSE:  Educated  RISK REDUCTION:  Start high dose statin.     For questions or updates, please contact [facility] Please consult [email or web address] for contact info under Cardiology/STEMI.   Signed, Rollene Rotunda, MD  03/11/2019, 8:51 AM

## 2019-03-11 NOTE — Progress Notes (Signed)
Alert and oriented x  4. Out of bed to chair. Continues on Amiodarone and Heparin drips. Lungs clear. On room air with O2 SAT 96-97% No complaints of shortness of breath, dizziness and/or chest pain. Call bell within reach. Encouraged to report signs and symptoms to staff.

## 2019-03-11 NOTE — Progress Notes (Addendum)
ANTICOAGULATION CONSULT NOTE - Initial Consult  Pharmacy Consult for heparin  Indication: chest pain/ACS  Allergies  Allergen Reactions  . Hydrocodone Hives    Patient Measurements: Height: 5\' 4"  (162.6 cm) Weight: 177 lb 7.5 oz (80.5 kg) IBW/kg (Calculated) : 54.7 Heparin Dosing Weight: 72kg  Vital Signs: Temp: 98.2 F (36.8 C) (06/02 0635) Temp Source: Oral (06/02 0635) BP: 97/55 (06/02 0700)  Labs: Recent Labs    03/10/19 1928 03/10/19 1954 03/11/19 0101 03/11/19 0623  HGB 13.2  --   --  13.2  HCT 40.4  --   --  40.6  PLT 309  --   --  332  HEPARINUNFRC  --  <0.10*  --  <0.10*  CREATININE 2.57*  --   --  2.73*  TROPONINI 1.87*  --  1.89* 1.94*    Estimated Creatinine Clearance: 22.8 mL/min (A) (by C-G formula based on SCr of 2.73 mg/dL (H)).   Medical History: Past Medical History:  Diagnosis Date  . Smoking history 03/10/2019   Smokes 1/2 pack a day    Medications:  No medications prior to admission.   Scheduled:  . insulin aspart  0-9 Units Subcutaneous TID WC    Assessment: 58 yo female from [facility] hospital with VT s/p emergent cardioversion.  Pharmacy consulted to dose heparin for r/o ACS.  She is currently on heparin at 1000 units/hr.  Patients heparin level this morning, returned subtherapeutic at <0.10. Troponins elevated at 1.87 > 1.89 > 1.94.  Spoke with patient personally and confirmed no line issues with RN.  CBC stable today, hgb at 13.2, hct at 40.6.   Goal of Therapy:  Heparin level 0.3-0.7 units/ml Monitor platelets by anticoagulation protocol: Yes   Plan:   - Increase heparin to 1200 units / hr - Check heparin level in 6 hours at 1500 - Daily heparin level and CBC  Thank you for involving pharmacy in this patient's care.  Bradley Ferris, PharmD 03/11/2019 8:48 AM PGY-1 Pharmacy Resident Direct Phone: [phone] Please check [email or web address] for unit-specific pharmacist phone numbers

## 2019-03-11 NOTE — Progress Notes (Signed)
  Echocardiogram 2D Echocardiogram has been performed.  W[place]ona [place] G Shelbe Haglund 03/11/2019, 12:10 PM

## 2019-03-11 NOTE — Progress Notes (Signed)
ANTICOAGULATION CONSULT NOTE  Pharmacy Consult for heparin  Indication: chest pain/ACS  Allergies  Allergen Reactions  . Shrimp [Shellfish Allergy] Anaphylaxis  . Hydrocodone Hives    Patient Measurements: Height: 5\' 4"  (162.6 cm) Weight: 177 lb 7.5 oz (80.5 kg) IBW/kg (Calculated) : 54.7 Heparin Dosing Weight: 72kg  Vital Signs: Temp: 98.7 F (37.1 C) (06/02 1100) Temp Source: Oral (06/02 1100) BP: 118/68 (06/02 1100)  Labs: Recent Labs    03/10/19 1928 03/10/19 1954 03/11/19 0101 03/11/19 0623 03/11/19 1500  HGB 13.2  --   --  13.2  --   HCT 40.4  --   --  40.6  --   PLT 309  --   --  332  --   HEPARINUNFRC  --  <0.10*  --  <0.10* 0.17*  CREATININE 2.57*  --   --  2.73*  --   TROPONINI 1.87*  --  1.89* 1.94*  --     Estimated Creatinine Clearance: 22.8 mL/min (A) (by C-G formula based on SCr of 2.73 mg/dL (H)).   Medical History: Past Medical History:  Diagnosis Date  . Smoking history 03/10/2019   Smokes 1/2 pack a day    Medications:  Medications Prior to Admission  Medication Sig Dispense Refill Last Dose  . Ascorbic Acid (VITAMIN C) 1000 MG tablet Take 1,000 mg by mouth 2 (two) times a day.   03/09/2019  . DULoxetine (CYMBALTA) 30 MG capsule Take 30 mg by mouth at bedtime.   03/09/2019  . LANTUS SOLOSTAR 100 UNIT/ML Solostar Pen Inject 20 Units into the skin at bedtime.  03/09/2019 03/09/2019  . LORazepam (ATIVAN) 0.5 MG tablet Take 0.5 mg by mouth 2 (two) times daily as needed for anxiety.   Past Week at Unknown time  . losartan (COZAAR) 25 MG tablet Take 25 mg by mouth as needed for high blood pressure.   03/07/2019  . metFORMIN (GLUCOPHAGE) 1000 MG tablet Take 1,000 mg by mouth 2 (two) times a day.   03/10/2019  . omeprazole (PRILOSEC) 20 MG capsule Take 20 mg by mouth daily as needed (acid reflux).   Past Week at Unknown time   Scheduled:  . atorvastatin  80 mg Oral q1800  . Chlorhexidine Gluconate Cloth  6 each Topical Daily  . insulin aspart  0-9  Units Subcutaneous TID WC    Assessment: 58 yo female from [facility] with VT s/p emergent cardioversion.  Pharmacy consulted to dose heparin for r/o ACS.  Heparin level remains subtherapeutic at 0.17. No infusion issues or bleeding per RN. CBC wnl.  Goal of Therapy:  Heparin level 0.3-0.7 units/ml Monitor platelets by anticoagulation protocol: Yes   Plan:   - Increase heparin to 1300 units/hr - Check heparin level in 8 hours - Daily heparin level and CBC  Danae Orleans, PharmD PGY1 Pharmacy Resident Phone [phone] 03/11/2019       4:18 PM

## 2019-03-12 ENCOUNTER — Encounter (HOSPITAL_COMMUNITY)
Admission: AD | Disposition: A | Discharge status: Home / Self Care | Payer: Self-pay | Source: Other Acute Inpatient Hospital | Attending: Cardiology

## 2019-03-12 DIAGNOSIS — I2510 Atherosclerotic heart disease of native coronary artery without angina pectoris: Secondary | ICD-10-CM

## 2019-03-12 DIAGNOSIS — I252 Old myocardial infarction: Secondary | ICD-10-CM

## 2019-03-12 DIAGNOSIS — I214 Non-ST elevation (NSTEMI) myocardial infarction: Secondary | ICD-10-CM

## 2019-03-12 HISTORY — PX: LEFT HEART CATH AND CORONARY ANGIOGRAPHY: CATH118249

## 2019-03-12 LAB — GLUCOSE, CAPILLARY
Glucose-Capillary: 171 mg/dL — ABNORMAL HIGH (ref 70–99)
Glucose-Capillary: 125 mg/dL — ABNORMAL HIGH (ref 70–99)
Glucose-Capillary: 221 mg/dL — ABNORMAL HIGH (ref 70–99)
Glucose-Capillary: 242 mg/dL — ABNORMAL HIGH (ref 70–99)

## 2019-03-12 LAB — CBC
HCT: 39.6 % (ref 36.0–46.0)
Hemoglobin: 13.1 g/dL (ref 12.0–15.0)
MCH: 29.2 pg (ref 26.0–34.0)
MCHC: 33.1 g/dL (ref 30.0–36.0)
MCV: 88.4 fL (ref 80.0–100.0)
Platelets: 295 10*3/uL (ref 150–400)
RBC: 4.48 MIL/uL (ref 3.87–5.11)
RDW: 14.2 % (ref 11.5–15.5)
WBC: 9.9 10*3/uL (ref 4.0–10.5)
nRBC: 0 % (ref 0.0–0.2)

## 2019-03-12 LAB — BASIC METABOLIC PANEL
Anion gap: 11 (ref 5–15)
BUN: 36 mg/dL — ABNORMAL HIGH (ref 6–20)
CO2: 24 mmol/L (ref 22–32)
Calcium: 8 mg/dL — ABNORMAL LOW (ref 8.9–10.3)
Chloride: 98 mmol/L (ref 98–111)
Creatinine, Ser: 2.05 mg/dL — ABNORMAL HIGH (ref 0.44–1.00)
GFR calc Af Amer: 30 mL/min — ABNORMAL LOW (ref >60)
GFR calc non Af Amer: 26 mL/min — ABNORMAL LOW (ref >60)
Glucose, Bld: 187 mg/dL — ABNORMAL HIGH (ref 70–99)
Potassium: 3.6 mmol/L (ref 3.5–5.1)
Sodium: 133 mmol/L — ABNORMAL LOW (ref 135–145)

## 2019-03-12 LAB — HEPARIN LEVEL (UNFRACTIONATED)
Heparin Unfractionated: 0.2 IU/mL — ABNORMAL LOW (ref 0.30–0.70)
Heparin Unfractionated: 0.37 IU/mL (ref 0.30–0.70)

## 2019-03-12 SURGERY — LEFT HEART CATH AND CORONARY ANGIOGRAPHY
Anesthesia: LOCAL

## 2019-03-12 MED ORDER — SODIUM CHLORIDE 0.9 % IV SOLN: 250.0000 mL | INTRAVENOUS | Status: DC | PRN

## 2019-03-12 MED ORDER — ACETAMINOPHEN 325 MG PO TABS
650.0000 mg | ORAL_TABLET | ORAL | Status: DC | PRN
  Administered 2019-03-13 – 2019-03-15 (×6): 650 mg via ORAL
  Filled 2019-03-12 (×6): qty 2

## 2019-03-12 MED ORDER — INSULIN GLARGINE 100 UNIT/ML ~~LOC~~ SOLN
10.0000 [IU] | Freq: Every day | SUBCUTANEOUS | Status: DC
  Filled 2019-03-12: qty 0.1

## 2019-03-12 MED ORDER — SODIUM CHLORIDE 0.9% FLUSH: 3.0000 mL | INTRAVENOUS | Status: DC | PRN

## 2019-03-12 MED ORDER — LIDOCAINE HCL (PF) 1 % IJ SOLN
INTRAMUSCULAR | Status: AC
  Filled 2019-03-12: qty 30

## 2019-03-12 MED ORDER — HEPARIN SODIUM (PORCINE) 1000 UNIT/ML IJ SOLN
INTRAMUSCULAR | Status: DC | PRN
  Administered 2019-03-12: 4500 [IU] via INTRAVENOUS

## 2019-03-12 MED ORDER — LORAZEPAM 0.5 MG PO TABS
0.5000 mg | ORAL_TABLET | Freq: Two times a day (BID) | ORAL | Status: DC | PRN
  Administered 2019-03-12 – 2019-03-15 (×6): 0.5 mg via ORAL
  Filled 2019-03-12 (×6): qty 1

## 2019-03-12 MED ORDER — ASPIRIN 81 MG PO CHEW
81.0000 mg | CHEWABLE_TABLET | Freq: Every day | ORAL | Status: DC
  Administered 2019-03-13 – 2019-03-15 (×3): 81 mg via ORAL
  Filled 2019-03-12 (×3): qty 1

## 2019-03-12 MED ORDER — VERAPAMIL HCL 2.5 MG/ML IV SOLN
INTRAVENOUS | Status: AC
  Filled 2019-03-12: qty 2

## 2019-03-12 MED ORDER — LIDOCAINE HCL (PF) 1 % IJ SOLN
INTRAMUSCULAR | Status: DC | PRN
  Administered 2019-03-12: 2 mL

## 2019-03-12 MED ORDER — MIDAZOLAM HCL 2 MG/2ML IJ SOLN
INTRAMUSCULAR | Status: AC
  Filled 2019-03-12: qty 2

## 2019-03-12 MED ORDER — FENTANYL CITRATE (PF) 100 MCG/2ML IJ SOLN
INTRAMUSCULAR | Status: DC | PRN
  Administered 2019-03-12: 25 ug via INTRAVENOUS

## 2019-03-12 MED ORDER — HEPARIN (PORCINE) IN NACL 1000-0.9 UT/500ML-% IV SOLN
INTRAVENOUS | Status: DC | PRN
  Administered 2019-03-12 (×2): 500 mL

## 2019-03-12 MED ORDER — VERAPAMIL HCL 2.5 MG/ML IV SOLN
INTRAVENOUS | Status: DC | PRN
  Administered 2019-03-12: 14:00:00 via INTRA_ARTERIAL

## 2019-03-12 MED ORDER — HEPARIN (PORCINE) IN NACL 1000-0.9 UT/500ML-% IV SOLN
INTRAVENOUS | Status: AC
  Filled 2019-03-12: qty 1000

## 2019-03-12 MED ORDER — MIDAZOLAM HCL 2 MG/2ML IJ SOLN
INTRAMUSCULAR | Status: DC | PRN
  Administered 2019-03-12: 1 mg via INTRAVENOUS

## 2019-03-12 MED ORDER — FENTANYL CITRATE (PF) 100 MCG/2ML IJ SOLN
INTRAMUSCULAR | Status: AC
  Filled 2019-03-12: qty 2

## 2019-03-12 MED ORDER — INSULIN GLARGINE 100 UNIT/ML ~~LOC~~ SOLN
10.0000 [IU] | Freq: Every day | SUBCUTANEOUS | Status: DC
  Administered 2019-03-13 – 2019-03-15 (×3): 10 [IU] via SUBCUTANEOUS
  Filled 2019-03-12 (×3): qty 0.1

## 2019-03-12 MED ORDER — ONDANSETRON HCL 4 MG/2ML IJ SOLN: 4.0000 mg | Freq: Four times a day (QID) | INTRAMUSCULAR | Status: DC | PRN

## 2019-03-12 MED ORDER — HEPARIN BOLUS VIA INFUSION
2000.0000 [IU] | Freq: Once | INTRAVENOUS | Status: AC
  Administered 2019-03-12: 2000 [IU] via INTRAVENOUS
  Filled 2019-03-12: qty 2000

## 2019-03-12 MED ORDER — SODIUM CHLORIDE 0.9% FLUSH
3.0000 mL | Freq: Two times a day (BID) | INTRAVENOUS | Status: DC
  Administered 2019-03-12: 3 mL via INTRAVENOUS

## 2019-03-12 MED ORDER — IOHEXOL 350 MG/ML SOLN
INTRAVENOUS | Status: DC | PRN
  Administered 2019-03-12: 80 mL via INTRAVENOUS

## 2019-03-12 MED ORDER — CARVEDILOL 3.125 MG PO TABS
3.1250 mg | ORAL_TABLET | Freq: Two times a day (BID) | ORAL | Status: DC
  Administered 2019-03-12 – 2019-03-15 (×6): 3.125 mg via ORAL
  Filled 2019-03-12 (×6): qty 1

## 2019-03-12 SURGICAL SUPPLY — 10 items
CATH INFINITI 5FR JK (CATHETERS) ×2 IMPLANT
DEVICE RAD COMP TR BAND LRG (VASCULAR PRODUCTS) ×2 IMPLANT
ELECT DEFIB PAD ADLT CADENCE (PAD) ×2 IMPLANT
GLIDESHEATH SLEND SS 6F .021 (SHEATH) ×2 IMPLANT
GUIDEWIRE INQWIRE 1.5J.035X260 (WIRE) ×1 IMPLANT
INQWIRE 1.5J .035X260CM (WIRE) ×2
KIT HEART LEFT (KITS) ×2 IMPLANT
PACK CARDIAC CATHETERIZATION (CUSTOM PROCEDURE TRAY) ×2 IMPLANT
TRANSDUCER W/STOPCOCK (MISCELLANEOUS) ×2 IMPLANT
TUBING CIL FLEX 10 FLL-RA (TUBING) ×2 IMPLANT

## 2019-03-12 NOTE — Progress Notes (Signed)
ANTICOAGULATION CONSULT NOTE - Initial Consult  Pharmacy Consult for heparin  Indication: chest pain/ACS  Allergies  Allergen Reactions  . Shrimp [Shellfish Allergy] Anaphylaxis  . Hydrocodone Hives    Patient Measurements: Height: 5\' 4"  (162.6 cm) Weight: 188 lb 0.8 oz (85.3 kg) IBW/kg (Calculated) : 54.7 Heparin Dosing Weight: 72kg  Vital Signs: Temp: 98.3 F (36.8 C) (06/03 1105) Temp Source: Oral (06/03 1105) BP: 117/70 (06/03 1100)  Labs: Recent Labs    03/10/19 1928  03/11/19 0101 03/11/19 0623 03/11/19 1500 03/12/19 0045 03/12/19 1038  HGB 13.2  --   --  13.2  --  13.1  --   HCT 40.4  --   --  40.6  --  39.6  --   PLT 309  --   --  332  --  295  --   HEPARINUNFRC  --    < >  --  <0.10* 0.17* 0.20* 0.37  CREATININE 2.57*  --   --  2.73*  --  2.05*  --   TROPONINI 1.87*  --  1.89* 1.94*  --   --   --    < > = values in this interval not displayed.    Estimated Creatinine Clearance: 31.2 mL/min (A) (by C-G formula based on SCr of 2.05 mg/dL (H)).   Medical History: Past Medical History:  Diagnosis Date  . Smoking history 03/10/2019   Smokes 1/2 pack a day    Medications:  Medications Prior to Admission  Medication Sig Dispense Refill Last Dose  . Ascorbic Acid (VITAMIN C) 1000 MG tablet Take 1,000 mg by mouth 2 (two) times a day.   03/09/2019  . DULoxetine (CYMBALTA) 30 MG capsule Take 30 mg by mouth at bedtime.   03/09/2019  . LANTUS SOLOSTAR 100 UNIT/ML Solostar Pen Inject 20 Units into the skin at bedtime.  03/09/2019 03/09/2019  . LORazepam (ATIVAN) 0.5 MG tablet Take 0.5 mg by mouth 2 (two) times daily as needed for anxiety.   Past Week at Unknown time  . losartan (COZAAR) 25 MG tablet Take 25 mg by mouth as needed for high blood pressure.   03/07/2019  . metFORMIN (GLUCOPHAGE) 1000 MG tablet Take 1,000 mg by mouth 2 (two) times a day.   03/10/2019  . omeprazole (PRILOSEC) 20 MG capsule Take 20 mg by mouth daily as needed (acid reflux).   Past Week at  Unknown time   Scheduled:  . [START ON 03/13/2019] aspirin  81 mg Oral Daily  . atorvastatin  80 mg Oral q1800  . Chlorhexidine Gluconate Cloth  6 each Topical Daily  . insulin aspart  0-9 Units Subcutaneous TID WC  . insulin glargine  10 Units Subcutaneous QHS  . sodium chloride flush  3 mL Intravenous Q12H    Assessment: 58 yo female from [facility] with VT s/p emergent cardioversion.  Pharmacy consulted to dose heparin for r/o ACS.  Troponins elevated at 1.87 > 1.89 > 1.94.   Heparin level therapeutic this morning at 0.37 units / mL. No bleeding per patient and nurse.  CBC stable and wnl.   Goal of Therapy:  Heparin level 0.3-0.7 units/ml Monitor platelets by anticoagulation protocol: Yes   Plan:   - Continue heparin at 1500 units / hr - Daily heparin level and CBC  Thank you for involving pharmacy in this patient's care.  Bradley Ferris, PharmD 03/12/2019 11:57 AM PGY-1 Pharmacy Resident Direct Phone: [phone] Please check [email or web address] for unit-specific pharmacist phone numbers

## 2019-03-12 NOTE — Progress Notes (Signed)
Sats down to 84 while patient sleeping. She states she think this "always happens when I sleep". 2L O2 added and sats now 95%.

## 2019-03-12 NOTE — Interval H&P Note (Signed)
Cath Lab Visit (complete for each Cath Lab visit)  Clinical Evaluation Leading to the Procedure:   ACS: Yes.    Non-ACS:  N/a   History and Physical Interval Note:  03/12/2019 1:40 PM  Melinda Nguyen  has presented today for surgery, with the diagnosis of vt.  The various methods of treatment have been discussed with the patient and family. After consideration of risks, benefits and other options for treatment, the patient has consented to  Procedure(s): LEFT HEART CATH AND CORONARY ANGIOGRAPHY (N/A) as a surgical intervention.  The patient's history has been reviewed, patient examined, no change in status, stable for surgery.  I have reviewed the patient's chart and labs.  Questions were answered to the patient's satisfaction.     Lorine Bears

## 2019-03-12 NOTE — Progress Notes (Signed)
ANTICOAGULATION CONSULT NOTE - Follow Up Consult  Pharmacy Consult for heparin Indication: chest pain/ACS  Labs: Recent Labs    03/10/19 1928  03/11/19 0101 03/11/19 0623 03/11/19 1500 03/12/19 0045  HGB 13.2  --   --  13.2  --  13.1  HCT 40.4  --   --  40.6  --  39.6  PLT 309  --   --  332  --  295  HEPARINUNFRC  --    < >  --  <0.10* 0.17* 0.20*  CREATININE 2.57*  --   --  2.73*  --  2.05*  TROPONINI 1.87*  --  1.89* 1.94*  --   --    < > = values in this interval not displayed.    Assessment: 58yo female subtherapeutic on heparin after rate increase; no gtt issues or signs of bleeding per RN.  Goal of Therapy:  Heparin level 0.3-0.7 units/ml   Plan:  Will rebolus with heparin 2000 units and increase heparin gtt by 3 units/kg/hr to 1500 units/hr and check level in 8 hours.    Vernard Gambles, PharmD, BCPS  03/12/2019,2:05 AM

## 2019-03-12 NOTE — Progress Notes (Signed)
Pt beloning (clothes abnd cellphone with charger) sent with cath lab staff to cath lab holding area to be reunited with patient

## 2019-03-12 NOTE — H&P (View-Only) (Signed)
Progress Note  Patient Name: Melinda Nguyen Date of Encounter: 03/12/2019  Primary Cardiologist:   No primary care provider on file.   Subjective   No chest pain.  No SOB.   Inpatient Medications    Scheduled Meds: . atorvastatin  80 mg Oral q1800  . Chlorhexidine Gluconate Cloth  6 each Topical Daily  . insulin aspart  0-9 Units Subcutaneous TID WC  . sodium chloride flush  3 mL Intravenous Q12H   Continuous Infusions: . sodium chloride    . sodium chloride 1 mL/kg/hr (03/11/19 2026)  . amiodarone 30 mg/hr (03/12/19 0700)  . heparin 1,500 Units/hr (03/12/19 0700)   PRN Meds:    Vital Signs    Vitals:   03/12/19 0500 03/12/19 0600 03/12/19 0700 03/12/19 0823  BP: 99/65 (!) 100/57 115/66   Pulse:      Resp: 13 20 (!) 22   Temp:    97.8 F (36.6 C)  TempSrc:    Oral  SpO2: 93% 95% 97%   Weight:      Height:        Intake/Output Summary (Last 24 hours) at 03/12/2019 0834 Last data filed at 03/12/2019 0700 Gross per 24 hour  Intake 2260.89 ml  Output -  Net 2260.89 ml   Filed Weights   03/10/19 1846 03/11/19 2020  Weight: 80.5 kg 85.3 kg    Telemetry    NSR - Personally Reviewed  ECG    NA - Personally Reviewed  Physical Exam   GEN: No  acute distress.   Neck: No  JVD Cardiac: RRR, no murmurs, rubs, or gallops.  Respiratory:     Left basilar crackles.  GI: Soft, nontender, non-distended, normal bowel sounds  MS:  Trace edema; No deformity. Neuro:   Nonfocal  Psych: Oriented and appropriate    Labs    Chemistry Recent Labs  Lab 03/10/19 1928 03/11/19 0623 03/12/19 0045  NA 134* 132* 133*  K 3.8 5.4* 3.6  CL 98 96* 98  CO2 20* 21* 24  GLUCOSE 165* 125* 187*  BUN 40* 41* 36*  CREATININE 2.57* 2.73* 2.05*  CALCIUM 8.3* 8.1* 8.0*  GFRNONAA 20* 18* 26*  GFRAA 23* 21* 30*  ANIONGAP 16* 15 11     Hematology Recent Labs  Lab 03/10/19 1928 03/11/19 0623 03/12/19 0045  WBC 11.3* 10.2 9.9  RBC 4.59 4.59 4.48  HGB 13.2 13.2 13.1   HCT 40.4 40.6 39.6  MCV 88.0 88.5 88.4  MCH 28.8 28.8 29.2  MCHC 32.7 32.5 33.1  RDW 14.2 14.3 14.2  PLT 309 332 295    Cardiac Enzymes Recent Labs  Lab 03/10/19 1928 03/11/19 0101 03/11/19 0623  TROPONINI 1.87* 1.89* 1.94*   No results for input(s): TROPIPOC in the last 168 hours.   BNPNo results for input(s): BNP, PROBNP in the last 168 hours.   DDimer No results for input(s): DDIMER in the last 168 hours.   Radiology    No results found.  Cardiac Studies   ECHO:  1. Moderate hypokinesis of the left ventricular, entire anterolateral wall and inferolateral wall, consistent with ischemia or scar in the left circumflex coronary artery.  2. The left ventricle has mild-moderately reduced systolic function, with an ejection fraction of 40-45%. The cavity size was normal. Left ventricular diastolic Doppler parameters are consistent with pseudonormalization. Elevated mean left atrial  pressure.  3. The right ventricle has normal systolic function. The cavity was normal. There is no increase in right ventricular wall  thickness. Right ventricular systolic pressure is mildly elevated with an estimated pressure of 38.4 mmHg.  4. Left atrial size was mildly dilated.  5. Mitral valve regurgitation is moderate by color flow Doppler. The MR jet is centrally-directed.  6. The aortic root and ascending aorta are normal in size and structure.  7. The inferior vena cava was dilated in size with <50% respiratory variability.  8. No intracardiac thrombi or masses were visualized.   Patient Profile     58 y.o. female without prior cardiac history who has longstanding tobacco abuse and poorly controlled DM who presented to her primary care with wide complex tach.  She was cardioverted and [facility] ED.  EKG consistent with ventricular tachycardia  Assessment & Plan    AKI:     Creat is much improved.  Follow.   Cath today.   I suspect ATN from hypotension and dehydration.  I will follow and  avoid nephrotoxic agents.     NSTEMI :     Lateral wall infarct.  Not clear that this was a recent or old event.  Mild trop elevation would suggest that the lateral MI was not the acute event but rather the trop elevation might have been secondary to a resultant episode of VT.  She needs a cardiac cath.  We can do this today with limited dye.   I suspect ischemic heart disease.  However, this mild trop elevation might be secondary to her tachycardia.  Continue heparin and ASA.   The patient understands that risks included but are not limited to stroke (1 in 1000), death (1 in 1000), kidney failure [usually temporary] (1 in 250), bleeding (1 in 200), allergic reaction [possibly serious] (1 in 200).  The patient understands and agrees to proceed.   VENTRICULAR TACHYCARDIA:    I will stop the amiodarone.  I suspect this is related to scar/ischemia.  Plan to evaluate as above.    DM:   Sugars we controlled on SSI.  Restart out patient insulin regimen.    TOBACCO ABUSE:  Educated.     RISK REDUCTION:  On high dose statin.        For questions or updates, please contact [facility] Please consult [email or web address] for contact info under Cardiology/STEMI.   Signed, Rollene Rotunda, MD  03/12/2019, 8:34 AM

## 2019-03-12 NOTE — Progress Notes (Signed)
 Progress Note  Patient Name: Melinda Nguyen Date of Encounter: 03/12/2019  Primary Cardiologist:   No primary care provider on file.   Subjective   No chest pain.  No SOB.   Inpatient Medications    Scheduled Meds: . atorvastatin  80 mg Oral q1800  . Chlorhexidine Gluconate Cloth  6 each Topical Daily  . insulin aspart  0-9 Units Subcutaneous TID WC  . sodium chloride flush  3 mL Intravenous Q12H   Continuous Infusions: . sodium chloride    . sodium chloride 1 mL/kg/hr (03/11/19 2026)  . amiodarone 30 mg/hr (03/12/19 0700)  . heparin 1,500 Units/hr (03/12/19 0700)   PRN Meds:    Vital Signs    Vitals:   03/12/19 0500 03/12/19 0600 03/12/19 0700 03/12/19 0823  BP: 99/65 (!) 100/57 115/66   Pulse:      Resp: 13 20 (!) 22   Temp:    97.8 F (36.6 C)  TempSrc:    Oral  SpO2: 93% 95% 97%   Weight:      Height:        Intake/Output Summary (Last 24 hours) at 03/12/2019 0834 Last data filed at 03/12/2019 0700 Gross per 24 hour  Intake 2260.89 ml  Output -  Net 2260.89 ml   Filed Weights   03/10/19 1846 03/11/19 2020  Weight: 80.5 kg 85.3 kg    Telemetry    NSR - Personally Reviewed  ECG    NA - Personally Reviewed  Physical Exam   GEN: No  acute distress.   Neck: No  JVD Cardiac: RRR, no murmurs, rubs, or gallops.  Respiratory:     Left basilar crackles.  GI: Soft, nontender, non-distended, normal bowel sounds  MS:  Trace edema; No deformity. Neuro:   Nonfocal  Psych: Oriented and appropriate    Labs    Chemistry Recent Labs  Lab 03/10/19 1928 03/11/19 0623 03/12/19 0045  NA 134* 132* 133*  K 3.8 5.4* 3.6  CL 98 96* 98  CO2 20* 21* 24  GLUCOSE 165* 125* 187*  BUN 40* 41* 36*  CREATININE 2.57* 2.73* 2.05*  CALCIUM 8.3* 8.1* 8.0*  GFRNONAA 20* 18* 26*  GFRAA 23* 21* 30*  ANIONGAP 16* 15 11     Hematology Recent Labs  Lab 03/10/19 1928 03/11/19 0623 03/12/19 0045  WBC 11.3* 10.2 9.9  RBC 4.59 4.59 4.48  HGB 13.2 13.2 13.1   HCT 40.4 40.6 39.6  MCV 88.0 88.5 88.4  MCH 28.8 28.8 29.2  MCHC 32.7 32.5 33.1  RDW 14.2 14.3 14.2  PLT 309 332 295    Cardiac Enzymes Recent Labs  Lab 03/10/19 1928 03/11/19 0101 03/11/19 0623  TROPONINI 1.87* 1.89* 1.94*   No results for input(s): TROPIPOC in the last 168 hours.   BNPNo results for input(s): BNP, PROBNP in the last 168 hours.   DDimer No results for input(s): DDIMER in the last 168 hours.   Radiology    No results found.  Cardiac Studies   ECHO:  1. Moderate hypokinesis of the left ventricular, entire anterolateral wall and inferolateral wall, consistent with ischemia or scar in the left circumflex coronary artery.  2. The left ventricle has mild-moderately reduced systolic function, with an ejection fraction of 40-45%. The cavity size was normal. Left ventricular diastolic Doppler parameters are consistent with pseudonormalization. Elevated mean left atrial  pressure.  3. The right ventricle has normal systolic function. The cavity was normal. There is no increase in right ventricular wall   thickness. Right ventricular systolic pressure is mildly elevated with an estimated pressure of 38.4 mmHg.  4. Left atrial size was mildly dilated.  5. Mitral valve regurgitation is moderate by color flow Doppler. The MR jet is centrally-directed.  6. The aortic root and ascending aorta are normal in size and structure.  7. The inferior vena cava was dilated in size with <50% respiratory variability.  8. No intracardiac thrombi or masses were visualized.   Patient Profile     58 y.o. female without prior cardiac history who has longstanding tobacco abuse and poorly controlled DM who presented to her primary care with wide complex tach.  She was cardioverted and [facility] ED.  EKG consistent with ventricular tachycardia  Assessment & Plan    AKI:     Creat is much improved.  Follow.   Cath today.   I suspect ATN from hypotension and dehydration.  I will follow and  avoid nephrotoxic agents.     NSTEMI :     Lateral wall infarct.  Not clear that this was a recent or old event.  Mild trop elevation would suggest that the lateral MI was not the acute event but rather the trop elevation might have been secondary to a resultant episode of VT.  She needs a cardiac cath.  We can do this today with limited dye.   I suspect ischemic heart disease.  However, this mild trop elevation might be secondary to her tachycardia.  Continue heparin and ASA.   The patient understands that risks included but are not limited to stroke (1 in 1000), death (1 in 1000), kidney failure [usually temporary] (1 in 250), bleeding (1 in 200), allergic reaction [possibly serious] (1 in 200).  The patient understands and agrees to proceed.   VENTRICULAR TACHYCARDIA:    I will stop the amiodarone.  I suspect this is related to scar/ischemia.  Plan to evaluate as above.    DM:   Sugars we controlled on SSI.  Restart out patient insulin regimen.    TOBACCO ABUSE:  Educated.     RISK REDUCTION:  On high dose statin.        For questions or updates, please contact [facility] Please consult [email or web address] for contact info under Cardiology/STEMI.   Signed, Rollene Rotunda, MD  03/12/2019, 8:34 AM

## 2019-03-13 ENCOUNTER — Encounter (HOSPITAL_COMMUNITY): Payer: Self-pay | Admitting: Cardiovascular Disease

## 2019-03-13 ENCOUNTER — Inpatient Hospital Stay (HOSPITAL_COMMUNITY)
Admission: AD | Disposition: A | Discharge status: Home / Self Care | Payer: Self-pay | Source: Other Acute Inpatient Hospital | Attending: Cardiology

## 2019-03-13 DIAGNOSIS — Z95810 Presence of automatic (implantable) cardiac defibrillator: Secondary | ICD-10-CM

## 2019-03-13 DIAGNOSIS — I214 Non-ST elevation (NSTEMI) myocardial infarction: Secondary | ICD-10-CM

## 2019-03-13 DIAGNOSIS — I255 Ischemic cardiomyopathy: Secondary | ICD-10-CM

## 2019-03-13 HISTORY — DX: Presence of automatic (implantable) cardiac defibrillator: Z95.810

## 2019-03-13 HISTORY — PX: ICD IMPLANT: EP1208

## 2019-03-13 LAB — BASIC METABOLIC PANEL
Anion gap: 8 (ref 5–15)
BUN: 24 mg/dL — ABNORMAL HIGH (ref 6–20)
CO2: 25 mmol/L (ref 22–32)
Calcium: 7.9 mg/dL — ABNORMAL LOW (ref 8.9–10.3)
Chloride: 104 mmol/L (ref 98–111)
Creatinine, Ser: 1.11 mg/dL — ABNORMAL HIGH (ref 0.44–1.00)
GFR calc Af Amer: >60 mL/min (ref >60)
GFR calc non Af Amer: 54 mL/min — ABNORMAL LOW (ref >60)
Glucose, Bld: 192 mg/dL — ABNORMAL HIGH (ref 70–99)
Potassium: 3.7 mmol/L (ref 3.5–5.1)
Sodium: 137 mmol/L (ref 135–145)

## 2019-03-13 LAB — MAGNESIUM: Magnesium: 1.6 mg/dL — ABNORMAL LOW (ref 1.7–2.4)

## 2019-03-13 LAB — GLUCOSE, CAPILLARY
Glucose-Capillary: 143 mg/dL — ABNORMAL HIGH (ref 70–99)
Glucose-Capillary: 169 mg/dL — ABNORMAL HIGH (ref 70–99)
Glucose-Capillary: 128 mg/dL — ABNORMAL HIGH (ref 70–99)
Glucose-Capillary: 239 mg/dL — ABNORMAL HIGH (ref 70–99)
Glucose-Capillary: 246 mg/dL — ABNORMAL HIGH (ref 70–99)

## 2019-03-13 LAB — CBC
HCT: 35.8 % — ABNORMAL LOW (ref 36.0–46.0)
Hemoglobin: 11.6 g/dL — ABNORMAL LOW (ref 12.0–15.0)
MCH: 29.1 pg (ref 26.0–34.0)
MCHC: 32.4 g/dL (ref 30.0–36.0)
MCV: 89.7 fL (ref 80.0–100.0)
Platelets: 258 10*3/uL (ref 150–400)
RBC: 3.99 MIL/uL (ref 3.87–5.11)
RDW: 14.2 % (ref 11.5–15.5)
WBC: 6.8 10*3/uL (ref 4.0–10.5)
nRBC: 0 % (ref 0.0–0.2)

## 2019-03-13 SURGERY — ICD IMPLANT

## 2019-03-13 MED ORDER — FUROSEMIDE 10 MG/ML IJ SOLN: 80.0000 mg | Freq: Two times a day (BID) | INTRAMUSCULAR | Status: DC

## 2019-03-13 MED ORDER — NITROGLYCERIN 0.4 MG SL SUBL
SUBLINGUAL_TABLET | SUBLINGUAL | Status: AC
  Filled 2019-03-13: qty 1

## 2019-03-13 MED ORDER — ONDANSETRON HCL 4 MG/2ML IJ SOLN
INTRAMUSCULAR | Status: DC | PRN
  Administered 2019-03-13: 4 mg via INTRAVENOUS

## 2019-03-13 MED ORDER — FUROSEMIDE 10 MG/ML IJ SOLN
INTRAMUSCULAR | Status: AC
  Filled 2019-03-13: qty 4

## 2019-03-13 MED ORDER — HEPARIN (PORCINE) IN NACL 1000-0.9 UT/500ML-% IV SOLN
INTRAVENOUS | Status: AC
  Filled 2019-03-13: qty 500

## 2019-03-13 MED ORDER — CHLORHEXIDINE GLUCONATE 4 % EX LIQD
60.0000 mL | Freq: Once | CUTANEOUS | Status: AC
  Administered 2019-03-13: 4 via TOPICAL
  Filled 2019-03-13: qty 15

## 2019-03-13 MED ORDER — CEFAZOLIN SODIUM-DEXTROSE 2-4 GM/100ML-% IV SOLN
2.0000 g | INTRAVENOUS | Status: AC
  Administered 2019-03-13: 2 g via INTRAVENOUS
  Filled 2019-03-13: qty 100

## 2019-03-13 MED ORDER — MIDAZOLAM HCL 5 MG/5ML IJ SOLN
INTRAMUSCULAR | Status: DC | PRN
  Administered 2019-03-13: 1 mg via INTRAVENOUS

## 2019-03-13 MED ORDER — CEFAZOLIN SODIUM-DEXTROSE 1-4 GM/50ML-% IV SOLN
1.0000 g | Freq: Four times a day (QID) | INTRAVENOUS | Status: AC
  Administered 2019-03-13 – 2019-03-14 (×3): 1 g via INTRAVENOUS
  Filled 2019-03-13 (×4): qty 50

## 2019-03-13 MED ORDER — LIDOCAINE HCL (PF) 1 % IJ SOLN
INTRAMUSCULAR | Status: DC | PRN
  Administered 2019-03-13: 60 mL

## 2019-03-13 MED ORDER — SODIUM CHLORIDE 0.9% FLUSH: 3.0000 mL | INTRAVENOUS | Status: DC | PRN

## 2019-03-13 MED ORDER — SODIUM CHLORIDE 0.9 % IV SOLN
80.0000 mg | INTRAVENOUS | Status: AC
  Administered 2019-03-13: 80 mg
  Filled 2019-03-13: qty 2

## 2019-03-13 MED ORDER — SODIUM CHLORIDE 0.9 % IV SOLN
INTRAVENOUS | Status: DC
  Administered 2019-03-13: 10:00:00 via INTRAVENOUS

## 2019-03-13 MED ORDER — SODIUM CHLORIDE 0.9 % IV SOLN
INTRAVENOUS | Status: AC
  Filled 2019-03-13: qty 2

## 2019-03-13 MED ORDER — FUROSEMIDE 10 MG/ML IJ SOLN: 40.0000 mg | Freq: Once | INTRAMUSCULAR | Status: DC

## 2019-03-13 MED ORDER — NITROGLYCERIN IN D5W 200-5 MCG/ML-% IV SOLN
INTRAVENOUS | Status: AC
  Filled 2019-03-13: qty 250

## 2019-03-13 MED ORDER — SODIUM CHLORIDE 0.9 % IV SOLN: 250.0000 mL | INTRAVENOUS | Status: DC

## 2019-03-13 MED ORDER — MORPHINE SULFATE (PF) 10 MG/ML IV SOLN
INTRAVENOUS | Status: AC
  Filled 2019-03-13: qty 1

## 2019-03-13 MED ORDER — SODIUM CHLORIDE 0.9% FLUSH
3.0000 mL | Freq: Two times a day (BID) | INTRAVENOUS | Status: DC
  Administered 2019-03-13 – 2019-03-15 (×3): 3 mL via INTRAVENOUS

## 2019-03-13 MED ORDER — NITROGLYCERIN 0.4 MG/SPRAY TL SOLN
Status: AC
  Filled 2019-03-13: qty 4.9

## 2019-03-13 MED ORDER — NITROGLYCERIN IN D5W 200-5 MCG/ML-% IV SOLN
INTRAVENOUS | Status: AC | PRN
  Administered 2019-03-13: 10 ug/min via INTRAVENOUS

## 2019-03-13 MED ORDER — CEFAZOLIN SODIUM-DEXTROSE 2-4 GM/100ML-% IV SOLN
INTRAVENOUS | Status: AC
  Filled 2019-03-13: qty 100

## 2019-03-13 MED ORDER — MIDAZOLAM HCL 5 MG/5ML IJ SOLN
INTRAMUSCULAR | Status: AC
  Filled 2019-03-13: qty 5

## 2019-03-13 MED ORDER — SODIUM CHLORIDE 0.9% FLUSH: 10.0000 mL | INTRAVENOUS | Status: DC | PRN

## 2019-03-13 MED ORDER — FUROSEMIDE 10 MG/ML IJ SOLN
INTRAMUSCULAR | Status: DC | PRN
  Administered 2019-03-13 (×2): 40 mg via INTRAVENOUS

## 2019-03-13 MED ORDER — LIDOCAINE HCL 1 % IJ SOLN
INTRAMUSCULAR | Status: AC
  Filled 2019-03-13: qty 60

## 2019-03-13 MED ORDER — MAGNESIUM SULFATE 2 GM/50ML IV SOLN
2.0000 g | Freq: Once | INTRAVENOUS | Status: AC
  Administered 2019-03-13: 2 g via INTRAVENOUS
  Filled 2019-03-13: qty 50

## 2019-03-13 MED ORDER — CHLORHEXIDINE GLUCONATE 4 % EX LIQD
60.0000 mL | Freq: Once | CUTANEOUS | Status: AC
  Administered 2019-03-13: 4 via TOPICAL

## 2019-03-13 MED ORDER — ONDANSETRON HCL 4 MG/2ML IJ SOLN
INTRAMUSCULAR | Status: AC
  Filled 2019-03-13: qty 2

## 2019-03-13 MED ORDER — SODIUM CHLORIDE 0.9% FLUSH
10.0000 mL | Freq: Two times a day (BID) | INTRAVENOUS | Status: DC
  Administered 2019-03-13 – 2019-03-15 (×3): 10 mL

## 2019-03-13 MED ORDER — HEPARIN (PORCINE) IN NACL 1000-0.9 UT/500ML-% IV SOLN
INTRAVENOUS | Status: DC | PRN
  Administered 2019-03-13: 500 mL

## 2019-03-13 MED ORDER — NITROGLYCERIN IN D5W 200-5 MCG/ML-% IV SOLN: 0.0000 ug/min | INTRAVENOUS | Status: DC

## 2019-03-13 SURGICAL SUPPLY — 6 items
CABLE SURGICAL S-101-97-12 (CABLE) ×2 IMPLANT
ICD ELLIPSE VR CD1411-36C (ICD Generator) ×2 IMPLANT
LEAD DURATA 7122Q-58CM (Lead) ×2 IMPLANT
PAD PRO RADIOLUCENT 2001M-C (PAD) ×2 IMPLANT
SHEATH CLASSIC 7F (SHEATH) ×2 IMPLANT
TRAY PACEMAKER INSERTION (PACKS) ×2 IMPLANT

## 2019-03-13 NOTE — Progress Notes (Signed)
Inpatient Diabetes Program Recommendations  AACE/ADA: New Consensus Statement on Inpatient Glycemic Control (2015)  Target Ranges:  Prepandial:   less than 140 mg/dL      Peak postprandial:   less than 180 mg/dL (1-2 hours)      Critically ill patients:  140 - 180 mg/dL   Results for Melinda Nguyen, Melinda Nguyen (MRN [id number]) as of 03/13/2019 11:33  Ref. Range 03/12/2019 07:00 03/12/2019 11:02 03/12/2019 14:50 03/12/2019 16:33 03/12/2019 21:18 03/13/2019 06:16 03/13/2019 10:57  Glucose-Capillary Latest Ref Range: 70 - 99 mg/dL 366 (H) 440 (H) 347 (H) 221 (H) 242 (H) 169 (H) 128 (H)    Review of Glycemic Control  Diabetes history: DM 2 Outpatient Diabetes medications: Lantus 20 units, Metformin 1000 mg BID Current orders for Inpatient glycemic control: Lantus 10 units, Novolog 0-9 units tid  Inpatient Diabetes Program Recommendations:    Having ICD placed  Glucose trends elevated at bedtime and after meals.    -  Consider carb modified as part of diet when resumed.   -  Add Novolog 0-5 units qhs for bedtime coverage.   -  Increase Novolog Correction to Novolog 0-15 units tid.  Thanks,  Christena Deem RN, MSN, BC-ADM Inpatient Diabetes Coordinator Team Pager [phone] (8a-5p)

## 2019-03-13 NOTE — Progress Notes (Signed)
Patient wanting to eat and drink, bipap removed, [place] at 2 liters applied.  Will replace if patient breathing not going well and not putting out urine

## 2019-03-13 NOTE — Progress Notes (Signed)
RT assessed pts need for BIPAP at this time. Pt respiratory status is stable and pt resting without difficulty. RT will continue to monitor.

## 2019-03-13 NOTE — Progress Notes (Signed)
During the ICD implant, the patient went into acute pulmonary edema.  She was given 40 mg of Lasix without much response.  She continued respiratory distress.  Once the ICD was implanted, she received an extra dose of 40 mg of Lasix as well as was put on a nitroglycerin drip.  Respiratory therapy was called who put her on BiPAP.  Her oxygen saturations were initially in the 50s to 60s, but they improved to 100% on BiPAP.  Patient became much more responsive.  Patient's heart rate and blood pressure were initially in the 170s over 115 with heart rates of 115, but with therapy for her pulmonary edema, these also improved.  We Melinda Nguyen continue diuresis through the night tonight.  Melinda Nguyen Elberta Fortis, MD 03/13/2019 2:23 PM

## 2019-03-13 NOTE — Discharge Instructions (Signed)
° ° °  Supplemental Discharge Instructions for  Pacemaker/Defibrillator Patients  Activity No heavy lifting or vigorous activity with your left/right arm for 6 to 8 weeks.  Do not raise your left/right arm above your head for one week.  Gradually raise your affected arm as drawn below.              03/17/2019                  03/18/2019                   03/19/2019              03/20/2019 __  NO DRIVING for 6 months.  WOUND CARE - Keep the wound area clean and dry.  Do not get this area wet, no showers until cleared to at your wound check visit . - The tape/steri-strips on your wound will fall off; do not pull them off.  No bandage is needed on the site.  DO  NOT apply any creams, oils, or ointments to the wound area. - If you notice any drainage or discharge from the wound, any swelling or bruising at the site, or you develop a fever > 101? F after you are discharged home, call the office at once.  Special Instructions - You are still able to use cellular telephones; use the ear opposite the side where you have your pacemaker/defibrillator.  Avoid carrying your cellular phone near your device. - When traveling through airports, show security personnel your identification card to avoid being screened in the metal detectors.  Ask the security personnel to use the hand wand. - Avoid arc welding equipment, MRI testing (magnetic resonance imaging), TENS units (transcutaneous nerve stimulators).  Call the office for questions about other devices. - Avoid electrical appliances that are in poor condition or are not properly grounded. - Microwave ovens are safe to be near or to operate.  Additional information for defibrillator patients should your device go off: - If your device goes off ONCE and you feel fine afterward, notify the device clinic nurses. - If your device goes off ONCE and you do not feel well afterward, call 911. - If your device goes off TWICE, call 911. - If your device goes off THREE  times in one day, call 911.  DO NOT DRIVE YOURSELF OR A FAMILY MEMBER WITH A DEFIBRILLATOR TO THE HOSPITAL--CALL 911.

## 2019-03-13 NOTE — Progress Notes (Signed)
Patient returned from cath lab with bipap, nitroglycerin drip due to pulmonary edema

## 2019-03-13 NOTE — H&P (Signed)
ICD Criteria  Current LVEF:33%. Within 12 months prior to implant: Yes   Heart failure history: Yes, Class II  Cardiomyopathy history: Yes, Ischemic Cardiomyopathy - Prior MI.  Atrial Fibrillation/Atrial Flutter: No.  Ventricular tachycardia history: Yes, Hemodynamic instability present. VT Type: Sustained Ventricular Tachycardia - Monomorphic.  Cardiac arrest history: No.  History of syndromes with risk of sudden death: No.  Previous ICD: No.  Current ICD indication: Secondary  PPM indication: No.  Class I or II Bradycardia indication present: No  Beta Blocker therapy for 3 or more months: Yes, prescribed.   Ace Inhibitor/ARB therapy for 3 or more months: No, medical reason.   I have seen Melinda Nguyen is a 58 y.o. femalepre-procedural and has been referred by Angelina Sheriff for consideration of ICD implant for secondary prevention of sudden death.  The patient's chart has been reviewed and they meet criteria for ICD implant.  I have had a thorough discussion with the patient reviewing options.  The patient and their family (if available) have had opportunities to ask questions and have them answered. The patient and I have decided together through the [facility] Share Decision Support Tool to implant ICD at this time.  Risks, benefits, alternatives to ICD implantation were discussed in detail with the patient today. The patient  understands that the risks include but are not limited to bleeding, infection, pneumothorax, perforation, tamponade, vascular damage, renal failure, MI, stroke, death, inappropriate shocks, and lead dislodgement and wishes to proceed.

## 2019-03-13 NOTE — Progress Notes (Signed)
Patient to cath lab for implanted defibrillator

## 2019-03-13 NOTE — Consult Note (Addendum)
Cardiology Consultation:   Patient ID: Melinda Nguyen MRN: [id number]; DOB: [birth year]  Admit date: 03/10/2019 Date of Consult: 03/13/2019  Primary Care Provider: Blane Ohara, MD Primary Cardiologist: new to [facility], Dr. Antoine Poche Primary Electrophysiologist:  None    Patient Profile:   Melinda Nguyen is a 58 y.o. female with a hx of DM (poorly controlled), obesity, GERD, and active smoker who is being seen today for the evaluation of VT at the request of Dr. Antoine Poche.  History of Present Illness:   Melinda Nguyen reports that for about 3-4 days prior to seeing her PMD/day of admission, she had felt poorly with very persistent nausea and recurrent episodes of vomiting, no CP, no perception of palpitations.  She was scheduled to see her PMD in f/u for being started in insulin, mentioning there she was feeling weak.  She reports they were unable to get a BP, she was found in VT, EMS was called and she was brought to the ER at [facility].  There she was found in VT, she developed progressive hypotension and was sedated and cardioverted.  Subsequently transferred to [facility].  w/u has noted TTE with LVEF 40-45%, and an occluded LCx by cath, not intervened on.  In d/w cardiology, suspect she infarcted at home some days prior to coming in, (likely her days of nausea/vomiting), her Trop at [facility] 1.70, highest was her last at 1.94, fairly flat.  EP is asked to evaluate for ICD implant  LABS [place] K+ 4.6 Mag 1.80 BUN/Creat 45/2.00 WBC 13.6 H/H 14/44 plts 442 SARS-CoV-3 RNA was negative  Today K+ 3.7 BUN/Creat 24/1.11 Mag 1.6 WBC 6.8 H/H 11/35 Plts 258     Past Medical History:  Diagnosis Date  . Smoking history 03/10/2019   Smokes 1/2 pack a day    Past Surgical History:  Procedure Laterality Date  . LEFT HEART CATH AND CORONARY ANGIOGRAPHY N/A 03/12/2019   Procedure: LEFT HEART CATH AND CORONARY ANGIOGRAPHY;  Surgeon: Iran Ouch, MD;  Location: [facility] INVASIVE CV LAB;   Service: Cardiovascular;  Laterality: N/A;     Home Medications:  Prior to Admission medications   Medication Sig Start Date End Date Taking? Authorizing Provider  Ascorbic Acid (VITAMIN C) 1000 MG tablet Take 1,000 mg by mouth 2 (two) times a day.   Yes [provider]  DULoxetine (CYMBALTA) 30 MG capsule Take 30 mg by mouth at bedtime. 12/23/18  Yes [provider]  LANTUS SOLOSTAR 100 UNIT/ML Solostar Pen Inject 20 Units into the skin at bedtime. 03/07/19  Yes [provider]  LORazepam (ATIVAN) 0.5 MG tablet Take 0.5 mg by mouth 2 (two) times daily as needed for anxiety.   Yes [provider]  losartan (COZAAR) 25 MG tablet Take 25 mg by mouth as needed for high blood pressure. 09/18/18  Yes [provider]  metFORMIN (GLUCOPHAGE) 1000 MG tablet Take 1,000 mg by mouth 2 (two) times a day. 12/16/18  Yes [provider]  omeprazole (PRILOSEC) 20 MG capsule Take 20 mg by mouth daily as needed (acid reflux).   Yes [provider]    Inpatient Medications: Scheduled Meds: . aspirin  81 mg Oral Daily  . atorvastatin  80 mg Oral q1800  . carvedilol  3.125 mg Oral BID WC  . Chlorhexidine Gluconate Cloth  6 each Topical Daily  . insulin aspart  0-9 Units Subcutaneous TID WC  . insulin glargine  10 Units Subcutaneous Daily  . sodium chloride flush  3 mL Intravenous  Q12H   Continuous Infusions: . sodium chloride     PRN Meds: sodium chloride, acetaminophen, LORazepam, ondansetron (ZOFRAN) IV, sodium chloride flush  Allergies:    Allergies  Allergen Reactions  . Shrimp [Shellfish Allergy] Anaphylaxis  . Hydrocodone Hives    Social History:   Social History   Socioeconomic History  . Marital status: Single    Spouse name: Not on file  . Number of children: Not on file  . Years of education: Not on file  . Highest education level: Not on file  Occupational History  . Not on file  Social Needs  . Financial resource  strain: Not on file  . Food insecurity:    Worry: Not on file    Inability: Not on file  . Transportation needs:    Medical: Not on file    Non-medical: Not on file  Tobacco Use  . Smoking status: Not on file  Substance and Sexual Activity  . Alcohol use: Not on file  . Drug use: Not on file  . Sexual activity: Not on file  Lifestyle  . Physical activity:    Days per week: Not on file    Minutes per session: Not on file  . Stress: Not on file  Relationships  . Social connections:    Talks on phone: Not on file    Gets together: Not on file    Attends religious service: Not on file    Active member of club or organization: Not on file    Attends meetings of clubs or organizations: Not on file    Relationship status: Not on file  . Intimate partner violence:    Fear of current or ex partner: Not on file    Emotionally abused: Not on file    Physically abused: Not on file    Forced sexual activity: Not on file  Other Topics Concern  . Not on file  Social History Narrative  . Not on file    Family History:   Family History  Problem Relation Age of Onset  . Hypertension Mother      ROS:  Please see the history of present illness.  All other ROS reviewed and negative.     Physical Exam/Data:   Vitals:   03/12/19 1749 03/12/19 1945 03/12/19 2336 03/13/19 0422  BP: (!) 112/59 110/65 (!) 81/47   Pulse: 90 95 79 80  Resp:  (!) Temp: 98.4 F (36.9 C) 97.8 F (36.6 C) 97.8 F (36.6 C) 98.2 F (36.8 C)  TempSrc: Oral Oral Oral Oral  SpO2: 96% 94% 94%   Weight:    84.8 kg  Height:        Intake/Output Summary (Last 24 hours) at 03/13/2019 0851 Last data filed at 03/13/2019 0250 Gross per 24 hour  Intake 590.41 ml  Output 2200 ml  Net -1609.59 ml   Last 3 Weights 03/13/2019 03/11/2019 03/10/2019  Weight (lbs) 186 lb 15.2 oz 188 lb 0.8 oz 177 lb 7.5 oz  Weight (kg) 84.8 kg 85.3 kg 80.5 kg     Body mass index is 32.09 kg/m.  General:  Well nourished, well  developed, in no acute distress HEENT: normal Lymph: no adenopathy Neck: no JVD Endocrine:  No thryomegaly Vascular: No carotid bruits Cardiac: RRR; no murmurs, gallops or rubs Lungs:  CTA b/l, no wheezing, rhonchi or rales  Abd: soft, nontender  Ext: trace pitting edema feet b/l Musculoskeletal:  No deformities Skin: warm and dry  Neuro: no gross focal abnormalities noted Psych:  Normal affect   EKG:  The EKG was personally reviewed and demonstrates:    #1WCT, 222bpm, VT Yesterday, SR.  Poor R progression and T inv anterior leads  Telemetry:  Telemetry was personally reviewed and demonstrates:   SR, no recurrent VT  Relevant CV Studies:   03/12/2019: LHC  Prox Cx to Mid Cx lesion is 100% stenosed.  Prox LAD lesion is 30% stenosed.  Mid RCA lesion is 30% stenosed.   1.  Significant one-vessel coronary artery disease with an occluded mid left circumflex with some antegrade flow via the vessel likely due to spontaneous recanalization.  Mild LAD and RCA disease. 2.  Moderately elevated left ventricular end-diastolic pressure at 26 mmHg.  Left ventricular angiography was not performed due to renal failure.  EF was 40% by echo.  Recommendations: I suspect that the patient had a left circumflex infarct recently.  No indication for revascularization at the present time although this can be considered in the future for refractory angina. Recommend medical therapy for coronary artery disease and cardiomyopathy. I started small dose carvedilol. Only 20 mL of contrast was used for the procedure.  I am not going to hydrate the patient postprocedure given elevated LVEDP.   03/11/2019: TTE IMPRESSIONS  1. Moderate hypokinesis of the left ventricular, entire anterolateral wall and inferolateral wall, consistent with ischemia or scar in the left circumflex coronary artery.  2. The left ventricle has mild-moderately reduced systolic function, with an ejection fraction of 40-45%. The  cavity size was normal. Left ventricular diastolic Doppler parameters are consistent with pseudonormalization. Elevated mean left atrial  pressure.  3. The right ventricle has normal systolic function. The cavity was normal. There is no increase in right ventricular wall thickness. Right ventricular systolic pressure is mildly elevated with an estimated pressure of 38.4 mmHg.  4. Left atrial size was mildly dilated.  5. Mitral valve regurgitation is moderate by color flow Doppler. The MR jet is centrally-directed.  6. The aortic root and ascending aorta are normal in size and structure.  7. The inferior vena cava was dilated in size with <50% respiratory variability.  8. No intracardiac thrombi or masses were visualized.  FINDINGS  Left Ventricle: The left ventricle has mild-moderately reduced systolic function, with an ejection fraction of 40-45%. The cavity size was normal. There is no increase in left ventricular wall thickness. Left ventricular diastolic Doppler parameters are  consistent with pseudonormalization. Elevated mean left atrial pressure Moderate hypokinesis of the left ventricular, entire anterolateral wall and inferolateral wall.  Right Ventricle: The right ventricle has normal systolic function. The cavity was normal. There is no increase in right ventricular wall thickness. Right ventricular systolic pressure is mildly elevated with an estimated pressure of 38.4 mmHg.  Left Atrium: Left atrial size was mildly dilated.  Right Atrium: Right atrial size was normal in size. Right atrial pressure is estimated at 15 mmHg.  Interatrial Septum: No atrial level shunt detected by color flow Doppler.  Pericardium: There is no evidence of pericardial effusion.  Mitral Valve: The mitral valve is normal in structure. Mitral valve regurgitation is moderate by color flow Doppler. The MR jet is centrally-directed.  Tricuspid Valve: The tricuspid valve is normal in structure.  Tricuspid valve regurgitation is trivial by color flow Doppler.  Aortic Valve: The aortic valve is normal in structure. Aortic valve regurgitation was not visualized by color flow Doppler.  Pulmonic Valve: The pulmonic valve was not well visualized. Pulmonic  valve regurgitation is not visualized by color flow Doppler.  Aorta: The aortic root and ascending aorta are normal in size and structure.  Venous: The inferior vena cava is dilated in size with less than 50% respiratory variability.   Laboratory Data:  Chemistry Recent Labs  Lab 03/11/19 0623 03/12/19 0045 03/13/19 0156  NA 132* 133* 137  K 5.4* 3.6 3.7  CL 96* 98 104  CO2 21* 24 25  GLUCOSE 125* 187* 192*  BUN 41* 36* 24*  CREATININE 2.73* 2.05* 1.11*  CALCIUM 8.1* 8.0* 7.9*  GFRNONAA 18* 26* 54*  GFRAA 21* 30* >60  ANIONGAP 15 11 8     No results for input(s): PROT, ALBUMIN, AST, ALT, ALKPHOS, BILITOT in the last 168 hours. Hematology Recent Labs  Lab 03/11/19 0623 03/12/19 0045 03/13/19 0156  WBC 10.2 9.9 6.8  RBC 4.59 4.48 3.99  HGB 13.2 13.1 11.6*  HCT 40.6 39.6 35.8*  MCV 88.5 88.4 89.7  MCH 28.8 29.2 29.1  MCHC 32.5 33.1 32.4  RDW 14.3 14.2 14.2  PLT 332 295 258   Cardiac Enzymes Recent Labs  Lab 03/10/19 1928 03/11/19 0101 03/11/19 0623  TROPONINI 1.87* 1.89* 1.94*   No results for input(s): TROPIPOC in the last 168 hours.  BNPNo results for input(s): BNP, PROBNP in the last 168 hours.  DDimer No results for input(s): DDIMER in the last 168 hours.  Radiology/Studies:  No results found.  Assessment and Plan:   1. Hemodynamically unstable VT, requiring shock/cardioversion     Suspect suffered MI at home several days prior to her presentation LCX 100% occluded, not intervened on LVEF 40-45% Narrow QRS  Dr. Elberta Fortis discussed with the patient rational for ICD, implant procedure and potential risks/benefots Post-op expectations/usuaal course, recovery and temporary restrictions. She  is agreeable to proceed.  Dr. Elberta Fortis discussed with the patient as well, given her symptomatic/unstable VT and need for shock, [place] law no driving 6 months.  We Melinda Nguyen plan for implant today Note shell fish allergy, no issues with contrast yesterday, no need for prophylaxis    For questions or updates, please contact [facility] Please consult [email or web address] for contact info under     Signed, Melinda Pigeon, PA-C  03/13/2019 8:51 AM   I have seen and examined this patient with Francis Dowse.  Agree with above, note added to reflect my findings.  On exam, RRR, no murmurs, lungs clear.  Patient admitted to the hospital with an episode of sustained VT.  She had cardioversion at [facility].  She had left heart catheterization that showed an occluded circumflex.  No intervention was performed.  Due to her episode of ventricular tachycardia, Melinda Nguyen plan for ICD implantation.  Jdyn Parkerson M. Jamir Rone MD 03/13/2019 9:27 AM

## 2019-03-13 NOTE — Progress Notes (Addendum)
Progress Note  Patient Name: Melinda Nguyen Date of Encounter: 03/13/2019  Primary Cardiologist:   No primary care provider on file.   Subjective   No chest pain.  No SOB.    Inpatient Medications    Scheduled Meds: . aspirin  81 mg Oral Daily  . atorvastatin  80 mg Oral q1800  . carvedilol  3.125 mg Oral BID WC  . Chlorhexidine Gluconate Cloth  6 each Topical Daily  . insulin aspart  0-9 Units Subcutaneous TID WC  . insulin glargine  10 Units Subcutaneous Daily  . sodium chloride flush  3 mL Intravenous Q12H   Continuous Infusions: . sodium chloride     PRN Meds:    Vital Signs    Vitals:   03/12/19 1749 03/12/19 1945 03/12/19 2336 03/13/19 0422  BP: (!) 112/59 110/65 (!) 81/47   Pulse: 90 95 79 80  Resp:  (!) 23 17 20   Temp: 98.4 F (36.9 C) 97.8 F (36.6 C) 97.8 F (36.6 C) 98.2 F (36.8 C)  TempSrc: Oral Oral Oral Oral  SpO2: 96% 94% 94%   Weight:    84.8 kg  Height:        Intake/Output Summary (Last 24 hours) at 03/13/2019 0854 Last data filed at 03/13/2019 0250 Gross per 24 hour  Intake 590.41 ml  Output 2200 ml  Net -1609.59 ml   Filed Weights   03/10/19 1846 03/11/19 2020 03/13/19 0422  Weight: 80.5 kg 85.3 kg 84.8 kg    Telemetry    NSR, PVCs - Personally Reviewed  ECG    NA - Personally Reviewed  Physical Exam   GEN: No  acute distress.   Neck: No  JVD Cardiac: RRR, no murmurs, rubs, or gallops.  Respiratory:   Few basilar crackles. GI: Soft, nontender, non-distended, normal bowel sounds  MS:  Mild ankle edema; No deformity.  Right wrist without bleeding or bruising Neuro:   Nonfocal  Psych: Oriented and appropriate    Labs    Chemistry Recent Labs  Lab 03/11/19 0623 03/12/19 0045 03/13/19 0156  NA 132* 133* 137  K 5.4* 3.6 3.7  CL 96* 98 104  CO2 21* 24 25  GLUCOSE 125* 187* 192*  BUN 41* 36* 24*  CREATININE 2.73* 2.05* 1.11*  CALCIUM 8.1* 8.0* 7.9*  GFRNONAA 18* 26* 54*  GFRAA 21* 30* >60  ANIONGAP 15 11 8      Hematology Recent Labs  Lab 03/11/19 0623 03/12/19 0045 03/13/19 0156  WBC 10.2 9.9 6.8  RBC 4.59 4.48 3.99  HGB 13.2 13.1 11.6*  HCT 40.6 39.6 35.8*  MCV 88.5 88.4 89.7  MCH 28.8 29.2 29.1  MCHC 32.5 33.1 32.4  RDW 14.3 14.2 14.2  PLT 332 295 258    Cardiac Enzymes Recent Labs  Lab 03/10/19 1928 03/11/19 0101 03/11/19 0623  TROPONINI 1.87* 1.89* 1.94*   No results for input(s): TROPIPOC in the last 168 hours.   BNPNo results for input(s): BNP, PROBNP in the last 168 hours.   DDimer No results for input(s): DDIMER in the last 168 hours.   Radiology    No results found.  Cardiac Studies   ECHO:  1. Moderate hypokinesis of the left ventricular, entire anterolateral wall and inferolateral wall, consistent with ischemia or scar in the left circumflex coronary artery.  2. The left ventricle has mild-moderately reduced systolic function, with an ejection fraction of 40-45%. The cavity size was normal. Left ventricular diastolic Doppler parameters are consistent with pseudonormalization. Elevated  mean left atrial  pressure.  3. The right ventricle has normal systolic function. The cavity was normal. There is no increase in right ventricular wall thickness. Right ventricular systolic pressure is mildly elevated with an estimated pressure of 38.4 mmHg.  4. Left atrial size was mildly dilated.  5. Mitral valve regurgitation is moderate by color flow Doppler. The MR jet is centrally-directed.  6. The aortic root and ascending aorta are normal in size and structure.  7. The inferior vena cava was dilated in size with <50% respiratory variability.  8. No intracardiac thrombi or masses were visualized.   Diagnostic  Dominance: Right        Patient Profile     58 y.o. female without prior cardiac history who has longstanding tobacco abuse and poorly controlled DM who presented to her primary care with wide complex tach.  She was cardioverted and [facility] ED.  EKG  consistent with ventricular tachycardia  Assessment & Plan    AKI:     Creat is much improved.  Follow.  I will titrated ARB as an out patient if BP allows    NSTEMI :     Lateral wall infarct.  Not clear that this was a recent or old event.  I suspect it was at least several days old.  Risk reduction and medical management.  ACUTE SYSTOLIC AND DIASTOLIC HF:   Will give IV diuresis after the procedure.    She is only tolerating low dose beta blocker for now.    VENTRICULAR TACHYCARDIA:    Likely related to scar.  Given the severity of the presenting arrhythmia she will need ICD.  EP has seen her in consultation.    DM:   Sugars we controlled on SSI.  Restarted out patient insulin regimen.    TOBACCO ABUSE:  Educated.     RISK REDUCTION:  On high dose statin.        For questions or updates, please contact [facility] Please consult [email or web address] for contact info under Cardiology/STEMI.   Signed, Rollene Rotunda, MD  03/13/2019, 8:54 AM

## 2019-03-13 NOTE — Progress Notes (Signed)
Called STAT to place pt on Bipap in cath lab for for fluid overload. Pt placed on Bipap & pt's oxygen increased & WOB decreased. Pt transported to 2C14 on Bipap with no complications noted.

## 2019-03-14 ENCOUNTER — Inpatient Hospital Stay (HOSPITAL_COMMUNITY): Payer: BC Managed Care – PPO

## 2019-03-14 ENCOUNTER — Encounter (HOSPITAL_COMMUNITY): Payer: Self-pay | Admitting: Cardiology

## 2019-03-14 LAB — BASIC METABOLIC PANEL
Anion gap: 9 (ref 5–15)
BUN: 17 mg/dL (ref 6–20)
CO2: 28 mmol/L (ref 22–32)
Calcium: 8.1 mg/dL — ABNORMAL LOW (ref 8.9–10.3)
Chloride: 102 mmol/L (ref 98–111)
Creatinine, Ser: 0.87 mg/dL (ref 0.44–1.00)
GFR calc Af Amer: >60 mL/min (ref >60)
GFR calc non Af Amer: >60 mL/min (ref >60)
Glucose, Bld: 142 mg/dL — ABNORMAL HIGH (ref 70–99)
Potassium: 3.6 mmol/L (ref 3.5–5.1)
Sodium: 139 mmol/L (ref 135–145)

## 2019-03-14 LAB — GLUCOSE, CAPILLARY
Glucose-Capillary: 140 mg/dL — ABNORMAL HIGH (ref 70–99)
Glucose-Capillary: 240 mg/dL — ABNORMAL HIGH (ref 70–99)
Glucose-Capillary: 176 mg/dL — ABNORMAL HIGH (ref 70–99)

## 2019-03-14 MED ORDER — FUROSEMIDE 10 MG/ML IJ SOLN
60.0000 mg | Freq: Two times a day (BID) | INTRAMUSCULAR | Status: DC
  Administered 2019-03-14 – 2019-03-15 (×3): 60 mg via INTRAVENOUS
  Filled 2019-03-14 (×2): qty 6

## 2019-03-14 MED ORDER — FUROSEMIDE 10 MG/ML IJ SOLN
INTRAMUSCULAR | Status: AC
  Administered 2019-03-14: 60 mg via INTRAVENOUS
  Filled 2019-03-14: qty 6

## 2019-03-14 MED FILL — Nitroglycerin SL Tab 0.4 MG: SUBLINGUAL | Qty: 1 | Status: AC

## 2019-03-14 MED FILL — Lidocaine HCl Local Inj 1%: INTRAMUSCULAR | Qty: 60 | Status: AC

## 2019-03-14 NOTE — Progress Notes (Signed)
Oxygen [place] removed for weaning trial

## 2019-03-14 NOTE — Progress Notes (Signed)
RT assessed pts need for BIPAP at this time. Pt respiratory status is stable pt currently on RA satting 93-96%. RT will continue to monitor.

## 2019-03-14 NOTE — Progress Notes (Signed)
Progress Note  Patient Name: Melinda Nguyen Date of Encounter: 03/14/2019  Primary Cardiologist: Hochrein (new)  Subjective   Currently feeling much improved.  Went into flash pulmonary edema yesterday and received IV Lasix, nitroglycerin IV, and was put on BiPAP.  BiPAP has been discontinued overnight.  She remains on nitroglycerin.  Net out 2 L.  Inpatient Medications    Scheduled Meds: . aspirin  81 mg Oral Daily  . atorvastatin  80 mg Oral q1800  . carvedilol  3.125 mg Oral BID WC  . Chlorhexidine Gluconate Cloth  6 each Topical Daily  . furosemide  80 mg Intravenous Q12H  . insulin aspart  0-9 Units Subcutaneous TID WC  . insulin glargine  10 Units Subcutaneous Daily  . sodium chloride flush  10-40 mL Intracatheter Q12H  . sodium chloride flush  3 mL Intravenous Q12H   Continuous Infusions: . sodium chloride    .  ceFAZolin (ANCEF) IV 1 g (03/14/19 0208)  . nitroGLYCERIN     PRN Meds: sodium chloride, acetaminophen, LORazepam, ondansetron (ZOFRAN) IV, sodium chloride flush, sodium chloride flush   Vital Signs    Vitals:   03/13/19 1402 03/13/19 1900 03/13/19 2006 03/13/19 2310  BP:  (!) 100/59  (!) 84/50  Pulse: (!) 113 89 85 72  Resp: (!) 30 (!) 25 (!) 23 (!) 21  Temp:   (!) 96.6 F (35.9 C) 98 F (36.7 C)  TempSrc:   Axillary Oral  SpO2: 100% 97%  93%  Weight:      Height:        Intake/Output Summary (Last 24 hours) at 03/14/2019 0743 Last data filed at 03/14/2019 0600 Gross per 24 hour  Intake 50 ml  Output 3200 ml  Net -3150 ml   Last 3 Weights 03/13/2019 03/11/2019 03/10/2019  Weight (lbs) 186 lb 15.2 oz 188 lb 0.8 oz 177 lb 7.5 oz  Weight (kg) 84.8 kg 85.3 kg 80.5 kg      Telemetry    Sinus rhythm- Personally Reviewed  ECG    Sinus rhythm- Personally Reviewed  Physical Exam   GEN: No acute distress.   Neck: No JVD Cardiac: RRR, no murmurs, rubs, or gallops.  Respiratory:  Crackles at the bases. GI: Soft, nontender, non-distended  MS: No  edema; No deformity. Neuro:  Nonfocal  Psych: Normal affect   Labs    Chemistry Recent Labs  Lab 03/12/19 0045 03/13/19 0156 03/14/19 0502  NA 133* 137 139  K 3.6 3.7 3.6  CL 98 104 102  CO2 24 25 28   GLUCOSE 187* 192* 142*  BUN 36* 24* 17  CREATININE 2.05* 1.11* 0.87  CALCIUM 8.0* 7.9* 8.1*  GFRNONAA 26* 54* >60  GFRAA 30* >60 >60  ANIONGAP 11 8 9      Hematology Recent Labs  Lab 03/11/19 0623 03/12/19 0045 03/13/19 0156  WBC 10.2 9.9 6.8  RBC 4.59 4.48 3.99  HGB 13.2 13.1 11.6*  HCT 40.6 39.6 35.8*  MCV 88.5 88.4 89.7  MCH 28.8 29.2 29.1  MCHC 32.5 33.1 32.4  RDW 14.3 14.2 14.2  PLT 332 295 258    Cardiac Enzymes Recent Labs  Lab 03/10/19 1928 03/11/19 0101 03/11/19 0623  TROPONINI 1.87* 1.89* 1.94*   No results for input(s): TROPIPOC in the last 168 hours.   BNPNo results for input(s): BNP, PROBNP in the last 168 hours.   DDimer No results for input(s): DDIMER in the last 168 hours.   Radiology    No results  found.  Cardiac Studies   TTE  1. Moderate hypokinesis of the left ventricular, entire anterolateral wall and inferolateral wall, consistent with ischemia or scar in the left circumflex coronary artery.  2. The left ventricle has mild-moderately reduced systolic function, with an ejection fraction of 40-45%. The cavity size was normal. Left ventricular diastolic Doppler parameters are consistent with pseudonormalization. Elevated mean left atrial  pressure.  3. The right ventricle has normal systolic function. The cavity was normal. There is no increase in right ventricular wall thickness. Right ventricular systolic pressure is mildly elevated with an estimated pressure of 38.4 mmHg.  4. Left atrial size was mildly dilated.  5. Mitral valve regurgitation is moderate by color flow Doppler. The MR jet is centrally-directed.  6. The aortic root and ascending aorta are normal in size and structure.  7. The inferior vena cava was dilated in size  with <50% respiratory variability.  8. No intracardiac thrombi or masses were visualized.  Patient Profile     58 y.o. female Who presented to the hospital in sustained ventricular tachycardia, found to have a occluded circumflex artery.  Assessment & Plan    1.  Ventricular tachycardia: Status post [facility] Jude ICD.  Device functioning appropriately.  Chest x-ray is without major abnormality.  2.  Acute systolic heart failure: Low EF likely due to a prior Nguyen and circumflex occlusion.  She went into flash pulmonary edema yesterday and received nitroglycerin IV, 2 doses of 40 mg of IV Lasix and was put on BiPAP.  BiPAP is since been discontinued.  She is net out 2 L.  We Montel Vanderhoof plan for continued diuresis as she has edema on her chest x-ray.  We Neftaly Swiss give 1 further dose of 80 mg of IV Lasix.  Further diuresis per primary team.  3.  Coronary artery disease: Has chronically occluded circumflex.  Halina Asano need secondary prevention. For questions or updates, please contact [facility] Please consult [email or web address] for contact info under    [facility] Lindsey Hommel sign off.   Medication Recommendations: Per primary team Other recommendations (labs, testing, etc): Per primary team Follow up as an outpatient: Arranged in EP clinic and device clinic     Signed, Jarrius Huaracha Jorja Loa, MD  03/14/2019, 7:43 AM

## 2019-03-14 NOTE — Progress Notes (Addendum)
Progress Note  Patient Name: Melinda Nguyen Date of Encounter: 03/14/2019  Primary Cardiologist:   No primary care provider on file.   Subjective   She feels OK.  Denies SOB.  Mild incisional pain.   Inpatient Medications    Scheduled Meds: . aspirin  81 mg Oral Daily  . atorvastatin  80 mg Oral q1800  . carvedilol  3.125 mg Oral BID WC  . Chlorhexidine Gluconate Cloth  6 each Topical Daily  . furosemide  80 mg Intravenous Q12H  . insulin aspart  0-9 Units Subcutaneous TID WC  . insulin glargine  10 Units Subcutaneous Daily  . sodium chloride flush  10-40 mL Intracatheter Q12H  . sodium chloride flush  3 mL Intravenous Q12H   Continuous Infusions: . sodium chloride    .  ceFAZolin (ANCEF) IV 1 g (03/14/19 0208)  . nitroGLYCERIN     PRN Meds:    Vital Signs    Vitals:   03/13/19 1402 03/13/19 1900 03/13/19 2006 03/13/19 2310  BP:  (!) 100/59  (!) 84/50  Pulse: (!) 113 89 85 72  Resp: (!) 30 (!) 25 (!) 23 (!) 21  Temp:   (!) 96.6 F (35.9 C) 98 F (36.7 C)  TempSrc:   Axillary Oral  SpO2: 100% 97%  93%  Weight:      Height:        Intake/Output Summary (Last 24 hours) at 03/14/2019 0745 Last data filed at 03/14/2019 0600 Gross per 24 hour  Intake 50 ml  Output 3200 ml  Net -3150 ml   Filed Weights   03/10/19 1846 03/11/19 2020 03/13/19 0422  Weight: 80.5 kg 85.3 kg 84.8 kg    Telemetry    NSR - Personally Reviewed  ECG    NA - Personally Reviewed  Physical Exam   GEN: No  acute distress.   Neck: No  JVD Cardiac: RRR, no murmurs, rubs, or gallops.  Respiratory: bilateral decreased breath sounds with crackles GI: Soft, nontender, non-distended, normal bowel sounds  MS:  No edema; No deformity.  Right radial without bruising or bleeding.   Chest:  Pacer site intact Neuro:   Nonfocal  Psych: Oriented and appropriate     Labs    Chemistry Recent Labs  Lab 03/12/19 0045 03/13/19 0156 03/14/19 0502  NA 133* 137 139  K 3.6 3.7 3.6  CL 98  104 102  CO2 24 25 28   GLUCOSE 187* 192* 142*  BUN 36* 24* 17  CREATININE 2.05* 1.11* 0.87  CALCIUM 8.0* 7.9* 8.1*  GFRNONAA 26* 54* >60  GFRAA 30* >60 >60  ANIONGAP 11 8 9      Hematology Recent Labs  Lab 03/11/19 0623 03/12/19 0045 03/13/19 0156  WBC 10.2 9.9 6.8  RBC 4.59 4.48 3.99  HGB 13.2 13.1 11.6*  HCT 40.6 39.6 35.8*  MCV 88.5 88.4 89.7  MCH 28.8 29.2 29.1  MCHC 32.5 33.1 32.4  RDW 14.3 14.2 14.2  PLT 332 295 258    Cardiac Enzymes Recent Labs  Lab 03/10/19 1928 03/11/19 0101 03/11/19 0623  TROPONINI 1.87* 1.89* 1.94*   No results for input(s): TROPIPOC in the last 168 hours.   BNPNo results for input(s): BNP, PROBNP in the last 168 hours.   DDimer No results for input(s): DDIMER in the last 168 hours.   Radiology    No results found.  Cardiac Studies   ECHO:  1. Moderate hypokinesis of the left ventricular, entire anterolateral wall and inferolateral wall,  consistent with ischemia or scar in the left circumflex coronary artery.  2. The left ventricle has mild-moderately reduced systolic function, with an ejection fraction of 40-45%. The cavity size was normal. Left ventricular diastolic Doppler parameters are consistent with pseudonormalization. Elevated mean left atrial  pressure.  3. The right ventricle has normal systolic function. The cavity was normal. There is no increase in right ventricular wall thickness. Right ventricular systolic pressure is mildly elevated with an estimated pressure of 38.4 mmHg.  4. Left atrial size was mildly dilated.  5. Mitral valve regurgitation is moderate by color flow Doppler. The MR jet is centrally-directed.  6. The aortic root and ascending aorta are normal in size and structure.  7. The inferior vena cava was dilated in size with <50% respiratory variability.  8. No intracardiac thrombi or masses were visualized.   Diagnostic  Dominance: Right        Patient Profile     58 y.o. female without prior  cardiac history who has longstanding tobacco abuse and poorly controlled DM who presented to her primary care with wide complex tach.  She was cardioverted and [facility] ED.  EKG consistent with ventricular tachycardia.  She had coronary anatomy as above and now is status post ICD.   Assessment & Plan    AKI:     Creat is much improved.  BP has been very low at times and unable to titrate ARB.     NSTEMI :     Lateral wall infarct.  Continue with risk reduction.   ACUTE SYSTOLIC AND DIASTOLIC HF:   She had flash edema as noted during the procedure but is improved.  Large effusion on CXR.  Needs diuresis in patient today.    I will give IV Lasix today .  Possibly home tomorrow.    VENTRICULAR TACHYCARDIA:    Status post ICD.    DM:   Continue previous meds.  Needs better control per Blane Ohara, MD  TOBACCO ABUSE:  Educated.     RISK REDUCTION:  Started on  high dose statin.       OTHER:  Needs a work excuse to be off work for two weeks after discharge.    For questions or updates, please contact [facility] Please consult [email or web address] for contact info under Cardiology/STEMI.   Signed, Rollene Rotunda, MD  03/14/2019, 7:45 AM

## 2019-03-14 NOTE — Progress Notes (Signed)
CARDIAC REHAB PHASE I   PRE:  Rate/Rhythm: 95 SR    BP: sitting 106/65    SaO2: 95 2L  MODE:  Ambulation: 370 ft   POST:  Rate/Rhythm: 93 SR    BP: sitting 108/69     SaO2: 95 2L, 88 RA off briefly  Assisted pt to EOB then ambulated with RW initially. Fairly steady, just sore so finished the walk without RW. No major c/o. Needed 2L O2, she will desat with just a few minutes without O2. Long discussion of MI, VT (gave Off the Beat book), HF management, diet, ex, smoking cessation, daily wts, and CPRII. Pt voiced understanding but she is quite overwhelmed. She is dealing with considerable stress at home as her husband has pancreatic ca and she cares for her mother. She is also working. Encouraged pt to read information and we can review tomorrow. [id number]   Harriet Masson CES, ACSM 03/14/2019 11:41 AM

## 2019-03-15 DIAGNOSIS — I5041 Acute combined systolic (congestive) and diastolic (congestive) heart failure: Secondary | ICD-10-CM

## 2019-03-15 LAB — GLUCOSE, CAPILLARY
Glucose-Capillary: 124 mg/dL — ABNORMAL HIGH (ref 70–99)
Glucose-Capillary: 230 mg/dL — ABNORMAL HIGH (ref 70–99)

## 2019-03-15 LAB — BASIC METABOLIC PANEL
Anion gap: 11 (ref 5–15)
BUN: 11 mg/dL (ref 6–20)
CO2: 31 mmol/L (ref 22–32)
Calcium: 8.3 mg/dL — ABNORMAL LOW (ref 8.9–10.3)
Chloride: 97 mmol/L — ABNORMAL LOW (ref 98–111)
Creatinine, Ser: 0.86 mg/dL (ref 0.44–1.00)
GFR calc Af Amer: >60 mL/min (ref >60)
GFR calc non Af Amer: >60 mL/min (ref >60)
Glucose, Bld: 191 mg/dL — ABNORMAL HIGH (ref 70–99)
Potassium: 3.6 mmol/L (ref 3.5–5.1)
Sodium: 139 mmol/L (ref 135–145)

## 2019-03-15 MED ORDER — POTASSIUM CHLORIDE CRYS ER 20 MEQ PO TBCR: 20.0000 meq | EXTENDED_RELEASE_TABLET | Freq: Every day | ORAL | 6 refills | Status: DC

## 2019-03-15 MED ORDER — CARVEDILOL 3.125 MG PO TABS: 3.1250 mg | ORAL_TABLET | Freq: Two times a day (BID) | ORAL | 3 refills | Status: DC

## 2019-03-15 MED ORDER — FUROSEMIDE 40 MG PO TABS: 40.0000 mg | ORAL_TABLET | Freq: Every day | ORAL | Status: DC

## 2019-03-15 MED ORDER — ATORVASTATIN CALCIUM 80 MG PO TABS: 80.0000 mg | ORAL_TABLET | Freq: Every day | ORAL | 3 refills | Status: DC

## 2019-03-15 MED ORDER — POTASSIUM CHLORIDE CRYS ER 20 MEQ PO TBCR
20.0000 meq | EXTENDED_RELEASE_TABLET | Freq: Every day | ORAL | Status: DC
  Administered 2019-03-15: 20 meq via ORAL
  Filled 2019-03-15: qty 1

## 2019-03-15 MED ORDER — NITROGLYCERIN 0.4 MG SL SUBL: 0.4000 mg | SUBLINGUAL_TABLET | SUBLINGUAL | 12 refills | Status: DC | PRN

## 2019-03-15 MED ORDER — FUROSEMIDE 40 MG PO TABS: 40.0000 mg | ORAL_TABLET | Freq: Every day | ORAL | 6 refills | Status: DC

## 2019-03-15 MED ORDER — ASPIRIN EC 81 MG PO TBEC: 81.0000 mg | DELAYED_RELEASE_TABLET | Freq: Every day | ORAL | 3 refills | Status: AC

## 2019-03-15 NOTE — Progress Notes (Signed)
Progress Note  Patient Name: Melinda Nguyen Date of Encounter: 03/15/2019  Primary Cardiologist: Hochrein   Subjective   58 year old female with a history of poorly controlled diabetes mellitus, tobacco abuse, hyperlipidemia, obesity was transferred from [facility] with ventricular tachycardia.  Cardiac catheterization on March 12, 2019 reveals an occluded circumflex artery.  She has mild disease in the proximal LAD and mid RCA.  She had an elevated end-diastolic filling pressure.  She had a [employer] ICD placed. She developed flash pulmonary edema 2 days ago which was treated with Lasix. Net I/O are -4.7 liters so far this admission.       Inpatient Medications    Scheduled Meds: . aspirin  81 mg Oral Daily  . atorvastatin  80 mg Oral q1800  . carvedilol  3.125 mg Oral BID WC  . Chlorhexidine Gluconate Cloth  6 each Topical Daily  . furosemide  60 mg Intravenous BID  . insulin aspart  0-9 Units Subcutaneous TID WC  . insulin glargine  10 Units Subcutaneous Daily  . sodium chloride flush  10-40 mL Intracatheter Q12H  . sodium chloride flush  3 mL Intravenous Q12H   Continuous Infusions: . sodium chloride    . nitroGLYCERIN     PRN Meds: sodium chloride, acetaminophen, LORazepam, ondansetron (ZOFRAN) IV, sodium chloride flush, sodium chloride flush   Vital Signs    Vitals:   03/15/19 0300 03/15/19 0500 03/15/19 0753 03/15/19 0800  BP: (!) 98/49  (!) 104/58   Pulse: 87  97 97  Resp: 18  19 15   Temp: 97.8 F (36.6 C)  98.3 F (36.8 C)   TempSrc: Oral  Oral   SpO2: 91%  93% 91%  Weight:  82.2 kg    Height:        Intake/Output Summary (Last 24 hours) at 03/15/2019 1025 Last data filed at 03/15/2019 0500 Gross per 24 hour  Intake 737 ml  Output 3600 ml  Net -2863 ml   Last 3 Weights 03/15/2019 03/14/2019 03/13/2019  Weight (lbs) 181 lb 3.5 oz 185 lb 13.6 oz 186 lb 15.2 oz  Weight (kg) 82.2 kg 84.3 kg 84.8 kg      Telemetry    NSR - Personally Reviewed   ECG     - Personally Reviewed  Physical Exam   GEN:  Middle-aged female, no acute distress Neck: No JVD Cardiac: RRR, no murmurs, rubs, or gallops.   ICD in the left subclavian region.  Wound is clean and dry.  Mildly tender. Respiratory: Clear to auscultation bilaterally. GI: Soft, nontender, non-distended  MS: No edema; No deformity. Neuro:  Nonfocal  Psych: Normal affect   Labs    Chemistry Recent Labs  Lab 03/12/19 0045 03/13/19 0156 03/14/19 0502  NA 133* 137 139  K 3.6 3.7 3.6  CL 98 104 102  CO2 24 25 28   GLUCOSE 187* 192* 142*  BUN 36* 24* 17  CREATININE 2.05* 1.11* 0.87  CALCIUM 8.0* 7.9* 8.1*  GFRNONAA 26* 54* >60  GFRAA 30* >60 >60  ANIONGAP 11 8 9      Hematology Recent Labs  Lab 03/11/19 0623 03/12/19 0045 03/13/19 0156  WBC 10.2 9.9 6.8  RBC 4.59 4.48 3.99  HGB 13.2 13.1 11.6*  HCT 40.6 39.6 35.8*  MCV 88.5 88.4 89.7  MCH 28.8 29.2 29.1  MCHC 32.5 33.1 32.4  RDW 14.3 14.2 14.2  PLT 332 295 258    Cardiac Enzymes Recent Labs  Lab 03/10/19 1928 03/11/19 0101 03/11/19  3159  TROPONINI 1.87* 1.89* 1.94*   No results for input(s): TROPIPOC in the last 168 hours.   BNPNo results for input(s): BNP, PROBNP in the last 168 hours.   DDimer No results for input(s): DDIMER in the last 168 hours.   Radiology    Dg Chest 2 View  Result Date: 03/14/2019 CLINICAL DATA:  Cardiac device in situ. EXAM: CHEST - 2 VIEW COMPARISON:  No prior. FINDINGS: Cardiac pacer with lead tip over the right ventricle. Cardiomegaly with bilateral pulmonary infiltrates/edema and bilateral prominent pleural effusions. Findings most consistent with congestive heart failure. No pneumothorax. IMPRESSION: Cardiac pacer with lead tip over the right ventricle. Cardiomegaly with bilateral pulmonary infiltrates/edema and bilateral prominent pleural effusions. Findings most consistent with congestive heart failure Electronically Signed   By: Marcello Moores  Register   On: 03/14/2019 09:20     Cardiac Studies     Patient Profile     58 y.o. female with recent MI and subsequent VT   Assessment & Plan    Coronary artery disease: She is status post lateral wall infarct.  She is committed to smoking cessation.  Continue current medications. Continue carvedilol.  Continue atorvastatin 80 mg a day  Acute systolic and diastolic congestive heart failure.  She had some flash pulmonary edema several days ago but has diuresed well and feels much better.  We will send her home on oral Lasix and potassium.  3.  Ventricular tachycardia: She is status post ICD placement.  She will follow-up with electrophysiology.  Diabetes mellitus.  She will follow-up with her primary medical doctor.  For questions or updates, please contact [place] Please consult [email or web address] for contact info under        Signed, Mertie Moores, MD  03/15/2019, 10:25 AM

## 2019-03-15 NOTE — Discharge Summary (Addendum)
Discharge Summary    Patient ID: Melinda Nguyen MRN: [id number]; DOB: 07/29/1961  Admit date: 03/10/2019 Discharge date: 03/15/2019  Primary Care Provider: Blane Oharaox, Kirsten, MD  Primary Cardiologist: Dr. Antoine PocheHochrein during admission but will be [facility] Primary Electrophysiologist:  Dr. Elberta Fortisamnitz  Discharge Diagnoses    Active Problems:   Ventricular tachycardia (HCC)   Non-ST elevation (NSTEMI) myocardial infarction [facility](HCC)   WCT   Poorly controlled DM   HLD   Tobacco abuse   CAD  AKI  Allergies Allergies  Allergen Reactions  . Shrimp [Shellfish Allergy] Anaphylaxis  . Hydrocodone Hives    Diagnostic Studies/Procedures    03/12/2019: LHC  Prox Cx to Mid Cx lesion is 100% stenosed.  Prox LAD lesion is 30% stenosed.  Mid RCA lesion is 30% stenosed.  1. Significant one-vessel coronary artery disease with an occluded mid left circumflex with some antegrade flow via the vessel likely due to spontaneous recanalization. Mild LAD and RCA disease. 2. Moderately elevated left ventricular end-diastolic pressure at 26 mmHg. Left ventricular angiography was not performed due to renal failure. EF was 40% by echo.  Recommendations: I suspect that the patient had a left circumflex infarct recently. No indication for revascularization at the present time although this can be considered in the future for refractory angina. Recommend medical therapy for coronary artery disease and cardiomyopathy. I started small dose carvedilol. Only 20 mL of contrast was used for the procedure. I am not going to hydrate the patient postprocedure given elevated LVEDP.  Diagnostic  Dominance: Right        03/11/2019: TTE IMPRESSIONS 1. Moderate hypokinesis of the left ventricular, entire anterolateral wall and inferolateral wall, consistent with ischemia or scar in the left circumflex coronary artery. 2. The left ventricle has mild-moderately reduced systolic  function, with an ejection fraction of 40-45%. The cavity size was normal. Left ventricular diastolic Doppler parameters are consistent with pseudonormalization. Elevated mean left atrial  pressure. 3. The right ventricle has normal systolic function. The cavity was normal. There is no increase in right ventricular wall thickness. Right ventricular systolic pressure is mildly elevated with an estimated pressure of 38.4 mmHg. 4. Left atrial size was mildly dilated. 5. Mitral valve regurgitation is moderate by color flow Doppler. The MR jet is centrally-directed. 6. The aortic root and ascending aorta are normal in size and structure. 7. The inferior vena cava was dilated in size with <50% respiratory variability. 8. No intracardiac thrombi or masses were visualized.    History of Present Illness     Melinda Nguyen is a 58 y.o. female with h/o poorly controlled T2DM, tobacco use, HLD, obesity and GERD, [facility].   [name] has no prior cardiac history. Cardiac risk factors included poorly controlled DM, recently started on Lantus, HLD and tobacco use. Smoker for 30 + years. Smokes < 1/2 ppd. Has been under a great deal of stress lately. Her husband has pancreatic cancer and her nephew passed away last week from drug overdose.   She developed vomiting and diarrhea several days ago and went to see PCP. She had labs drawn and found to have leukocytosis and abnormal LFTs. Blood sugar also elevated in the 400s and pt started on lantus. She had an appt for f/u labs today at PCP office. She felt weak and noted to have low BP. She denied CP, dyspnea, dizziness, syncope/ near syncope. Subsequent EKG showed VT w/ rate in the 200s. Sent  to [facility]. She given amiodarone bolus and drip. Did not improve. She got diaphoretic and BP continued to drop. She was sedated and cardioverted w/ 50 J. Per records from [facility], electrolytes were ok but noted to  have acute renal insuffiencey. SCr was 2.0 (normal 2 days prior). Troponin was 1.7. BNP [place]  [place] [address]. Pt [facility] for [facility]. She was stable on arrival. CP free. NSR. BP in the low 90s systolic.    Hospital Course     Consultants: EP  1. NSTEMI - Troponin remained flat 1.87>>1.89>>1.94. Treated with IV heparin. Echo showed  mild-moderately reduced systolic function with an ejection fraction of 40-45%. Moderate hypokinesis of the left ventricular, entire anterolateral wall and inferolateral wall, consistent with ischemia or scar in the left circumflex coronary artery. Significant one-vessel coronary artery disease with an occluded mid left circumflex with some antegrade flow via the vessel likely due to spontaneous recanalization. Mild LAD and RCA disease. LVEDP 26. Felt recent LCX infract recently. No indication for revascularization at the present time although this can be considered in the future for refractory angina. Recommend medical therapy for coronary artery disease and cardiomyopathy. Treated with ASA, statin and BB.   2. Acute systolic and diastolic CHF - Echo with LVEF of 40-45%. LVEDP 26 mm Hg by cath. She had some flash pulmonary edema but has diuresed well and feels much better. Net I/O are -4.7 liters so far this admission. Discharge weight 181lb. Continue BB. Unable to add ACE/ARB due to soft blood pressure. Consider as outpatient. Continue lasix and supplemental potassium.   3. VT - S/p emergent cardioversion at [facility]. Treated with amiodarone.  She is status post ICD placement.  She will follow-up with electrophysiology.  4. AKI - Resolved with diureses.  5. DM - Resume home medications  6. Tobacco abuse - Encouraged cessation  7. Social - She is dealing with considerable stress at home as her husband has pancreatic ca and she cares for her mother. She is also working.  The patient been seen by Dr. Elease HashimotoNahser today and  deemed ready for discharge home. All follow-up appointments have been scheduled. Discharge medications are listed below.   Discharge Vitals Blood pressure 120/65, pulse 79, temperature 98 F (36.7 C), temperature source Oral, resp. rate 18, height 5\' 4"  (1.626 m), weight 82.2 kg, SpO2 93 %.  Filed Weights   03/13/19 0422 03/14/19 0800 03/15/19 0500  Weight: 84.8 kg 84.3 kg 82.2 kg    Labs & Radiologic Studies    CBC Recent Labs    03/13/19 0156  WBC 6.8  HGB 11.6*  HCT 35.8*  MCV 89.7  PLT 258   Basic Metabolic Panel Recent Labs    16/07/9605/04/20 0156 03/14/19 0502 03/15/19 0921  NA 137 139 139  K 3.7 3.6 3.6  CL 104 102 97*  CO2 25 28 31   GLUCOSE 192* 142* 191*  BUN 24* 17 11  CREATININE 1.11* 0.87 0.86  CALCIUM 7.9* 8.1* 8.3*  MG 1.6*  --   --    Liver Function Tests No results for input(s): AST, ALT, ALKPHOS, BILITOT, PROT, ALBUMIN in the last 72 hours. No results for input(s): LIPASE, AMYLASE in the last 72 hours. Cardiac Enzymes No results for input(s): CKTOTAL, CKMB, CKMBINDEX, TROPONINI in the last 72 hours. BNP Invalid input(s): POCBNP D-Dimer No results for input(s): DDIMER in the last 72 hours. Hemoglobin A1C No results for input(s): HGBA1C in the last 72 hours. Fasting Lipid  Panel No results for input(s): CHOL, HDL, LDLCALC, TRIG, CHOLHDL, LDLDIRECT in the last 72 hours. Thyroid Function Tests No results for input(s): TSH, T4TOTAL, T3FREE, THYROIDAB in the last 72 hours.  Invalid input(s): FREET3 _____________  Dg Chest 2 View  Result Date: 03/14/2019 CLINICAL DATA:  Cardiac device in situ. EXAM: CHEST - 2 VIEW COMPARISON:  No prior. FINDINGS: Cardiac pacer with lead tip over the right ventricle. Cardiomegaly with bilateral pulmonary infiltrates/edema and bilateral prominent pleural effusions. Findings most consistent with congestive heart failure. No pneumothorax. IMPRESSION: Cardiac pacer with lead tip over the right ventricle. Cardiomegaly with  bilateral pulmonary infiltrates/edema and bilateral prominent pleural effusions. Findings most consistent with congestive heart failure Electronically Signed   By: Maisie Fushomas  Register   On: 03/14/2019 09:20   Disposition   Pt is being discharged home today in good condition.  Follow-up Plans & Appointments    Follow-up Information    [facility] [address] [facility] Follow up.   Specialty:  Cardiology Why:  03/25/2019 @ 2:00PM, wound check visit Contact information: [address], [address] [place] [id number] [phone]       Regan Lemmingamnitz, Will Martin, MD Follow up.   Specialty:  Cardiology Why:  you will be called by Dr. Gershon Craneamnitz's scheduler to make a routine 3 month follow up visit Contact information: [address] [place] [id number] [phone]        [facility] Follow up.   Specialty:  Cardiology Why:  office will call with time and date of appointment  Contact information: [address] [place] [phone]         Discharge Instructions    Amb Referral to Cardiac Rehabilitation   Complete by:  As directed    To [place]   Diagnosis:  NSTEMI   After initial evaluation and assessments completed: Virtual Based Care may be provided alone or in conjunction with Phase 2 Cardiac Rehab based on patient barriers.:  Yes   Diet - low sodium heart healthy   Complete by:  As directed    Discharge instructions   Complete by:  As directed    No driving for 2 weeks. No lifting over 10 lbs for 4 weeks. No sexual activity for 4 weeks. You may not return to work until cleared by your cardiologist. Keep procedure site clean & dry. If you notice increased pain, swelling, bleeding or pus, call/return!  You may shower, but no soaking baths/hot tubs/pools for 1 week.   Increase activity slowly   Complete by:  As directed       Discharge Medications   Allergies as of 03/15/2019      Reactions   Shrimp [shellfish  Allergy] Anaphylaxis   Hydrocodone Hives      Medication List    STOP taking these medications   losartan 25 MG tablet Commonly known as:  COZAAR     TAKE these medications   aspirin EC 81 MG tablet Take 1 tablet (81 mg total) by mouth daily.   atorvastatin 80 MG tablet Commonly known as:  LIPITOR Take 1 tablet (80 mg total) by mouth daily at 6 PM.   carvedilol 3.125 MG tablet Commonly known as:  COREG Take 1 tablet (3.125 mg total) by mouth 2 (two) times daily with a meal.   DULoxetine 30 MG capsule Commonly known as:  CYMBALTA Take 30 mg by mouth at bedtime. Notes to patient:  Tonight 03/15/2019   furosemide 40 MG  tablet Commonly known as:  LASIX Take 1 tablet (40 mg total) by mouth daily. Start taking on:  March 16, 2019   Lantus SoloStar 100 UNIT/ML Solostar Pen Generic drug:  Insulin Glargine Inject 20 Units into the skin at bedtime. Notes to patient:  Tonight 03/15/2019   LORazepam 0.5 MG tablet Commonly known as:  ATIVAN Take 0.5 mg by mouth 2 (two) times daily as needed for anxiety.   metFORMIN 1000 MG tablet Commonly known as:  GLUCOPHAGE Take 1,000 mg by mouth 2 (two) times a day. Notes to patient:  This evening 03/15/2019   nitroGLYCERIN 0.4 MG SL tablet Commonly known as:  Nitrostat Place 1 tablet (0.4 mg total) under the tongue every 5 (five) minutes as needed.   omeprazole 20 MG capsule Commonly known as:  PRILOSEC Take 20 mg by mouth daily as needed (acid reflux).   potassium chloride SA 20 MEQ tablet Commonly known as:  K-DUR Take 1 tablet (20 mEq total) by mouth daily. Start taking on:  March 16, 2019   vitamin C 1000 MG tablet Take 1,000 mg by mouth 2 (two) times a day. Notes to patient:  This evening 03/15/2019        Acute coronary syndrome (MI, NSTEMI, STEMI, etc) this admission?: Yes.     AHA/ACC Clinical Performance & Quality Measures: 1. Aspirin prescribed? - Yes 2. ADP Receptor Inhibitor (Plavix/Clopidogrel, Brilinta/Ticagrelor or  Effient/Prasugrel) prescribed (includes medically managed patients)? - No - no PCI 3. Beta Blocker prescribed? - Yes 4. High Intensity Statin (Lipitor 40-80mg  or Crestor 20-40mg ) prescribed? - Yes 5. EF assessed during THIS hospitalization? - Yes 6. For EF <40%, was ACEI/ARB prescribed? - No - Reason:  Plan to add as outpatient  7. For EF <40%, Aldosterone Antagonist (Spironolactone or Eplerenone) prescribed? - Not Applicable (EF >/= 81%) 8. Cardiac Rehab Phase II ordered (Included Medically managed Patients)? - No - no PCI     Outstanding Labs/Studies   Consider OP f/u labs 6-8 weeks given statin initiation this admission. BMET at follow up   Duration of Discharge Encounter   Greater than 30 minutes including physician time.  Signed, Leanor Kail, PA 03/15/2019, 1:22 PM  Attending Note:   The patient was seen and examined.  Agree with assessment and plan as noted above.  Changes made to the above note as needed.  Patient seen and independently examined with Robbie Lis, PA .   We discussed all aspects of the encounter. I agree with the assessment and plan as stated above.  Coronary artery disease: She is status post lateral wall infarct.  She is committed to smoking cessation.  Continue current medications. Continue carvedilol.  Continue atorvastatin 80 mg a day  Acute systolic and diastolic congestive heart failure.  She had some flash pulmonary edema several days ago but has diuresed well and feels much better.  We will send her home on oral Lasix and potassium.  3.  Ventricular tachycardia: She is status post ICD placement.  She will follow-up with electrophysiology.  Diabetes mellitus.  She will follow-up with her primary medical doctor.   I have spent a total of 40 minutes with patient reviewing hospital  notes , telemetry, EKGs, labs and examining patient as well as establishing an assessment and plan that was discussed with the patient. > 50% of time was spent in  direct patient care.    Thayer Headings, Brooke Bonito., MD, [facility] 03/16/2019, 4:19 PM [address]  [address] Office - [phone]  Pager [phone]

## 2019-03-15 NOTE — Progress Notes (Signed)
Pt already walked with RN this am, no O2 needed. She is feeling much better. Reviewed ed. Pt has good understanding and plans to continue reading information. She is practicing deep breathing to help with smoking cessation. Congratulated pt.  [phone] Yves Dill CES, ACSM 11:43 AM 03/15/2019

## 2019-03-15 NOTE — Progress Notes (Signed)
Pt got discharged to home, discharge instructions provided and patient showed understanding to it, IV taken out,Telemonitor DC,pt left unit in wheelchair with all of the belongings accompanied with a family member (Husband) Ramey Ketcherside,RN  

## 2019-03-15 NOTE — Plan of Care (Signed)

## 2019-03-21 ENCOUNTER — Telehealth (INDEPENDENT_AMBULATORY_CARE_PROVIDER_SITE_OTHER): Payer: BC Managed Care – PPO | Admitting: Cardiology

## 2019-03-21 ENCOUNTER — Telehealth: Payer: Self-pay

## 2019-03-21 ENCOUNTER — Other Ambulatory Visit: Payer: Self-pay

## 2019-03-21 ENCOUNTER — Encounter: Payer: Self-pay | Admitting: Cardiology

## 2019-03-21 VITALS — Ht 64.0 in | Wt 179.4 lb

## 2019-03-21 DIAGNOSIS — I472 Ventricular tachycardia, unspecified: Secondary | ICD-10-CM

## 2019-03-21 DIAGNOSIS — I2510 Atherosclerotic heart disease of native coronary artery without angina pectoris: Secondary | ICD-10-CM | POA: Diagnosis not present

## 2019-03-21 DIAGNOSIS — I10 Essential (primary) hypertension: Secondary | ICD-10-CM | POA: Diagnosis not present

## 2019-03-21 DIAGNOSIS — E118 Type 2 diabetes mellitus with unspecified complications: Secondary | ICD-10-CM

## 2019-03-21 DIAGNOSIS — Z95810 Presence of automatic (implantable) cardiac defibrillator: Secondary | ICD-10-CM

## 2019-03-21 DIAGNOSIS — E785 Hyperlipidemia, unspecified: Secondary | ICD-10-CM | POA: Diagnosis not present

## 2019-03-21 DIAGNOSIS — E1165 Type 2 diabetes mellitus with hyperglycemia: Secondary | ICD-10-CM | POA: Insufficient documentation

## 2019-03-21 NOTE — Patient Instructions (Signed)
Medication Instructions:  Your physician recommends that you continue on your current medications as directed. Please refer to the Current Medication list given to you today.  If you need a refill on your cardiac medications before your next appointment, please call your pharmacy.   Lab work: Your physician recommends that you return for lab work in 1 week: bmp   If you have labs (blood work) drawn today and your tests are completely normal, you will receive your results only by: Marland Kitchen MyChart Message (if you have MyChart) OR . A paper copy in the mail If you have any lab test that is abnormal or we need to change your treatment, we will call you to review the results.  Testing/Procedures: Your physician recommends that you return for lab work in: *  Follow-Up: At [facility], you and your health needs are our priority.  As part of our continuing mission to provide you with exceptional heart care, we have created designated Provider Care Teams.  These Care Teams include your primary Cardiologist (physician) and Advanced Practice Providers (APPs -  Physician Assistants and Nurse Practitioners) who all work together to provide you with the care you need, when you need it. You will need a follow up appointment in 1 months.  Please call our office 2 months in advance to schedule this appointment.  You may see No primary care provider on file. or another member of our [facility] Provider Team in [place]: Shirlee More, MD . Jyl Heinz, MD  Any Other Special Instructions Will Be Listed Below (If Applicable).

## 2019-03-21 NOTE — Telephone Encounter (Signed)
    COVID-19 Pre-Screening Questions:  . In the past 7 to 10 days have you had a cough,  shortness of breath, headache, congestion, fever (100 or greater) body aches, chills, sore throat, or sudden loss of taste or sense of smell? . Have you been around anyone with known Covid 19. . Have you been around anyone who is awaiting Covid 19 test results in the past 7 to 10 days? . Have you been around anyone who has been exposed to Covid 19, or has mentioned symptoms of Covid 19 within the past 7 to 10 days?  If you have any concerns/questions about symptoms patients report during screening (either on the phone or at threshold). Contact the provider seeing the patient or DOD for further guidance.  If neither are available contact a member of the leadership team.           The pt answered No to all Covid-19 questions. I explained to her to wear a mask to the appointment. The pt states she have to have someone with her because she do not drive. I explained to her that person will have to wear a mask to the appointment as well. The pt verbalized understanding.

## 2019-03-21 NOTE — Progress Notes (Signed)
Virtual Visit via Telephone Note   This visit type was conducted due to national recommendations for restrictions regarding the COVID-19 Pandemic (e.g. social distancing) in an effort to limit this patient's exposure and mitigate transmission in our community.  Due to her co-morbid illnesses, this patient is at least at moderate risk for complications without adequate follow up.  This format is felt to be most appropriate for this patient at this time.  The patient did not have access to video technology/had technical difficulties with video requiring transitioning to audio format only (telephone).  All issues noted in this document were discussed and addressed.  No physical exam could be performed with this format.  Please refer to the patient's chart for her  consent to telehealth for [facility]  Evaluation Performed:  Follow-up visit  This visit type was conducted due to national recommendations for restrictions regarding the COVID-19 Pandemic (e.g. social distancing).  This format is felt to be most appropriate for this patient at this time.  All issues noted in this document were discussed and addressed.  No physical exam was performed (except for noted visual exam findings with Video Visits).  Please refer to the patient's chart (MyChart message for video visits and phone note for telephone visits) for the patient's consent to telehealth for [facility].  Date:  03/21/2019  ID: Melinda Nguyen, DOB 1961/03/07, MRN [id number]   Patient Location: [address] [address]   Provider location:   [place] Office  PCP:  Rochel Brome, MD  Cardiologist:  Jenne Campus, MD     Chief Complaint: I am doing better  History of Present Illness:    Melinda Nguyen is a 57 y.o. female  who presents via audio/video conferencing for a telehealth visit today.  Past medical history significant for hypertension diabetes smoking.  Recently she started feeling poorly started having  vomiting she ended going to her primary care physician who noticed that she is hypotensive she was sent to the emergency room of [facility].  She was find to be in sustained ventricular tachycardia amiodarone IV was given however she did not converted to sinus rhythm became hypotensive and eventually electrical synchronized cardioversion was performed with 50 J of current.  She converted to sinus rhythm and then she was transferred to [facility] for evaluation cardiac catheterization was done which showed completely occluded circumflex artery however that looked chronic occlusion.  She did have a spike in troponin I but only to 1.6.  Ejection fraction assessed by echocardiogram was 4045% with segmental motion abnormalities.  Eventually she received ICD which is single lead device made by  [employer]. She have a tele-visit with me today to talk about her issues overall she is doing well she is still described to have some swelling of lower extremities but only mild she does have exertional shortness of breath but she thinks it is related to obesity.  Overall she is feeling better no chest pain no tightness squeezing pressure burning chest.  We spent a great deal of time talking about what happened to her what was done.  We also reviewed all her medications.  The patient does not have symptoms concerning for COVID-19 infection (fever, chills, cough, or new SHORTNESS OF BREATH).    Prior CV studies:   The following studies were reviewed today:  Cardiac catheterization showed:  03/12/2019: LHC  Prox Cx to Mid Cx lesion is 100% stenosed.  Prox LAD lesion is 30% stenosed.  Mid  RCA lesion is 30% stenosed.  1. Significant one-vessel coronary artery disease with an occluded mid left circumflex with some antegrade flow via the vessel likely due to spontaneous recanalization. Mild LAD and RCA disease. 2. Moderately elevated left ventricular end-diastolic pressure at 26 mmHg. Left ventricular  angiography was not performed due to renal failure. EF was 40% by echo.  Recommendations: I suspect that the patient had a left circumflex infarct recently. No indication for revascularization at the present time although this can be considered in the future for refractory angina. Recommend medical therapy for coronary artery disease and cardiomyopathy. I started small dose carvedilol. Only 20 mL of contrast was used for the procedure. I am not going to hydrate the patient postprocedure given elevated LVEDP.     Past Medical History:  Diagnosis Date  . AICD (automatic cardioverter/defibrillator) present 03/13/2019  . Diabetes mellitus without complication (HCC)   . Hypertension   . Smoking history 03/10/2019   Smokes 1/2 pack a day    Past Surgical History:  Procedure Laterality Date  . ABDOMINAL HYSTERECTOMY    . ICD IMPLANT N/A 03/13/2019   Procedure: ICD IMPLANT;  Surgeon: Regan Lemming, MD;  Location: [facility] INVASIVE CV LAB;  Service: Cardiovascular;  Laterality: N/A;  . ICD IMPLANT  03/13/2019  . LEFT HEART CATH AND CORONARY ANGIOGRAPHY N/A 03/12/2019   Procedure: LEFT HEART CATH AND CORONARY ANGIOGRAPHY;  Surgeon: Iran Ouch, MD;  Location: [facility] INVASIVE CV LAB;  Service: Cardiovascular;  Laterality: N/A;     Current Meds  Medication Sig  . Ascorbic Acid (VITAMIN C) 1000 MG tablet Take 1,000 mg by mouth 2 (two) times a day.  Marland Kitchen aspirin EC 81 MG tablet Take 1 tablet (81 mg total) by mouth daily.  Marland Kitchen atorvastatin (LIPITOR) 80 MG tablet Take 1 tablet (80 mg total) by mouth daily at 6 PM.  . carvedilol (COREG) 3.125 MG tablet Take 1 tablet (3.125 mg total) by mouth 2 (two) times daily with a meal.  . DULoxetine (CYMBALTA) 30 MG capsule Take 30 mg by mouth at bedtime.  . furosemide (LASIX) 40 MG tablet Take 1 tablet (40 mg total) by mouth daily.  Marland Kitchen LANTUS SOLOSTAR 100 UNIT/ML Solostar Pen Inject 20 Units into the skin at bedtime.  Marland Kitchen LORazepam (ATIVAN) 0.5 MG tablet  Take 0.5 mg by mouth 2 (two) times daily as needed for anxiety.  . metFORMIN (GLUCOPHAGE) 1000 MG tablet Take 1,000 mg by mouth 2 (two) times a day.  . nitroGLYCERIN (NITROSTAT) 0.4 MG SL tablet Place 1 tablet (0.4 mg total) under the tongue every 5 (five) minutes as needed.  Marland Kitchen omeprazole (PRILOSEC) 20 MG capsule Take 20 mg by mouth daily as needed (acid reflux).  . potassium chloride SA (K-DUR) 20 MEQ tablet Take 1 tablet (20 mEq total) by mouth daily.      Family History: The patient's family history includes Hypertension in her mother.   ROS:   Please see the history of present illness.     All other systems reviewed and are negative.   Labs/Other Tests and Data Reviewed:     Recent Labs: 03/13/2019: Hemoglobin 11.6; Magnesium 1.6; Platelets 258 03/15/2019: BUN 11; Creatinine, Ser 0.86; Potassium 3.6; Sodium 139  Recent Lipid Panel    Component Value Date/Time   CHOL 160 03/11/2019 0101   TRIG 111 03/11/2019 0101   HDL 43 03/11/2019 0101   CHOLHDL 3.7 03/11/2019 0101   VLDL 22 03/11/2019 0101   LDLCALC 95 03/11/2019 0101  Exam:    Vital Signs:  Ht 5\' 4"  (1.626 m)   Wt 179 lb 6.4 oz (81.4 kg)   LMP  (LMP Unknown)   BMI 30.79 kg/m     Wt Readings from Last 3 Encounters:  03/21/19 179 lb 6.4 oz (81.4 kg)  03/15/19 181 lb 3.5 oz (82.2 kg)     Well nourished, well developed in no acute distress. Alert awake and at the time 3 happy to be able to talk to me over the phone.  She was unable to establish video link.  Diagnosis for this visit:   1. Ventricular tachycardia (HCC)   2. Coronary artery disease involving native coronary artery of native heart without angina pectoris   3. Essential hypertension   4. Dyslipidemia   5. Poorly controlled diabetes mellitus (HCC)      ASSESSMENT & PLAN:    1.  History of ventricular tachycardia.  That being addressed with single-chamber AICD.  She is also on metoprolol which I will continue.  She does have appointment  next week to have a wound check after defibrillator.  So far no discharges from the device. 2.  Coronary artery disease with completely occluded circumflex artery based on cardiac catheterization done just few days ago.  Doing fine recovering nicely after cardiac catheterization.  Medical therapy.  Denies having any symptoms. 3.  Essential hypertension her blood pressure still elevated however she also got elevation of creatinine I will make arrangements for her to have Chem-7 done when she comes in have her chest pacemaker wound check.  I anticipate and hope that I will be able to put her on ARB or ACE inhibitor. 4.  Dyslipidemia she is on high intensity statin and she should be on it she is actually questioning me why she is on medications since she had always good cholesterol I spent some time to explain to her why there is a role for cholesterol medication somebody with completely occluded the artery even if cholesterol is normal. 5.  Poorly controlled diabetes.  She have appointment with her primary care physician and I stressed importance of exercises on the regular basis as well as good diet to keep her diabetes under control.  COVID-19 Education: The signs and symptoms of COVID-19 were discussed with the patient and how to seek care for testing (follow up with PCP or arrange E-visit).  The importance of social distancing was discussed today.  Patient Risk:   After full review of this patients clinical status, I feel that they are at least moderate risk at this time.  Time:   Today, I have spent 25 minutes with the patient with telehealth technology discussing pt health issues.  I spent 5 minutes reviewing her chart before the visit.  Visit was finished at 11:46 AM.    Medication Adjustments/Labs and Tests Ordered: Current medicines are reviewed at length with the patient today.  Concerns regarding medicines are outlined above.  No orders of the defined types were placed in this encounter.   Medication changes: No orders of the defined types were placed in this encounter.    Disposition: 1 month  Signed, Georgeanna Leaobert J. Kieryn Burtis, MD, [facility] 03/21/2019 11:47 AM    [facility]

## 2019-03-21 NOTE — Addendum Note (Signed)
Addended by: Ashok Norris on: 03/21/2019 12:05 PM   Modules accepted: Orders

## 2019-03-25 ENCOUNTER — Other Ambulatory Visit: Payer: BC Managed Care – PPO

## 2019-03-25 ENCOUNTER — Ambulatory Visit (INDEPENDENT_AMBULATORY_CARE_PROVIDER_SITE_OTHER): Payer: BC Managed Care – PPO | Admitting: *Deleted

## 2019-03-25 ENCOUNTER — Other Ambulatory Visit: Payer: Self-pay

## 2019-03-25 DIAGNOSIS — Z95810 Presence of automatic (implantable) cardiac defibrillator: Secondary | ICD-10-CM | POA: Diagnosis not present

## 2019-03-25 DIAGNOSIS — I472 Ventricular tachycardia, unspecified: Secondary | ICD-10-CM

## 2019-03-25 DIAGNOSIS — I10 Essential (primary) hypertension: Secondary | ICD-10-CM | POA: Diagnosis not present

## 2019-03-25 LAB — BASIC METABOLIC PANEL
BUN/Creatinine Ratio: 20 (ref 9–23)
BUN: 11 mg/dL (ref 6–24)
CO2: 31 mmol/L — ABNORMAL HIGH (ref 20–29)
Calcium: 9.2 mg/dL (ref 8.7–10.2)
Chloride: 96 mmol/L (ref 96–106)
Creatinine, Ser: 0.56 mg/dL — ABNORMAL LOW (ref 0.57–1.00)
GFR calc Af Amer: 118 mL/min/{1.73_m2} (ref >59)
GFR calc non Af Amer: 102 mL/min/{1.73_m2} (ref >59)
Glucose: 240 mg/dL — ABNORMAL HIGH (ref 65–99)
Potassium: 4.6 mmol/L (ref 3.5–5.2)
Sodium: 136 mmol/L (ref 134–144)

## 2019-03-25 LAB — CUP PACEART INCLINIC DEVICE CHECK
Battery Remaining Longevity: 96 mo
Brady Statistic RV Percent Paced: 0 %
Date Time Interrogation Session: 20200616144637
HighPow Impedance: 66 Ohm
Implantable Lead Implant Date: 20200604
Implantable Lead Location: 753860
Implantable Pulse Generator Implant Date: 20200604
Lead Channel Impedance Value: 430 Ohm
Lead Channel Pacing Threshold Amplitude: 1 V
Lead Channel Pacing Threshold Pulse Width: 0.5 ms
Lead Channel Sensing Intrinsic Amplitude: 11.3 mV
Lead Channel Setting Pacing Amplitude: 3.5 V
Lead Channel Setting Pacing Pulse Width: 0.5 ms
Lead Channel Setting Sensing Sensitivity: 0.5 mV
Pulse Gen Serial Number: 9860889

## 2019-03-25 NOTE — Progress Notes (Addendum)
Wound check appointment. Steri-strips removed. Wound without redness or edema. Incision edges approximated, wound well healed. Normal device function. Threshold, sensing, and impedances consistent with implant measurements. Device programmed at 3.5V for extra safety margin until 3 month visit. Histogram distribution appropriate for patient and level of activity. No ventricular arrhythmias noted. Patient educated about wound care, arm mobility, lifting restrictions, shock plan, and Merlin monitor. Also reiterated driving restrictions x6 months (confirmed with Dr. Curt Bears). ROV with Dr. Curt Bears in [place] in 3 months.

## 2019-03-31 ENCOUNTER — Telehealth: Payer: Self-pay | Admitting: Emergency Medicine

## 2019-03-31 MED ORDER — LOSARTAN POTASSIUM 25 MG PO TABS: 25.0000 mg | ORAL_TABLET | Freq: Every day | ORAL | 3 refills | Status: DC

## 2019-03-31 NOTE — Telephone Encounter (Signed)
Called patient and informed her of lab results and to start losartan 25 mg daily.

## 2019-04-09 ENCOUNTER — Other Ambulatory Visit: Payer: Self-pay | Admitting: Physician Assistant

## 2019-04-29 ENCOUNTER — Other Ambulatory Visit: Payer: Self-pay

## 2019-04-29 ENCOUNTER — Encounter: Payer: Self-pay | Admitting: Cardiology

## 2019-04-29 ENCOUNTER — Ambulatory Visit (INDEPENDENT_AMBULATORY_CARE_PROVIDER_SITE_OTHER): Payer: BC Managed Care – PPO | Admitting: Cardiology

## 2019-04-29 VITALS — BP 120/60 | HR 102 | Temp 98.4°F | Ht 64.0 in | Wt 179.0 lb

## 2019-04-29 DIAGNOSIS — I472 Ventricular tachycardia, unspecified: Secondary | ICD-10-CM

## 2019-04-29 DIAGNOSIS — I2510 Atherosclerotic heart disease of native coronary artery without angina pectoris: Secondary | ICD-10-CM

## 2019-04-29 DIAGNOSIS — I10 Essential (primary) hypertension: Secondary | ICD-10-CM

## 2019-04-29 DIAGNOSIS — E785 Hyperlipidemia, unspecified: Secondary | ICD-10-CM

## 2019-04-29 DIAGNOSIS — Z95810 Presence of automatic (implantable) cardiac defibrillator: Secondary | ICD-10-CM

## 2019-04-29 NOTE — Progress Notes (Signed)
Cardiology Office Note:    Date:  04/29/2019   ID:  Melinda Nguyen, DOB 01-27-1961, MRN [id number]  PCP:  Rochel Brome, MD  Cardiologist:  Jenne Campus, MD    Referring MD: Rochel Brome, MD   Chief Complaint  Patient presents with  . Follow-up  From doing well  History of Present Illness:    Melinda Nguyen is a 58 y.o. female complex history she came to her primary care physician with sustained ventricular tachycardia she was transferred to hospital required cardioversion after that she was transferred to [facility] cardiac catheterization was done, she was found to have completely occluded circumflex artery.  That was chronic occlusion therefore there was no intervention performed.  After that she received single-chamber ICD.  Since that time doing well.  Denies having any chest pain tightness squeezing pressure burning chest.  Complain of having some shortness of breath and still some soreness in the defibrillator area.  Past Medical History:  Diagnosis Date  . AICD (automatic cardioverter/defibrillator) present 03/13/2019  . Diabetes mellitus without complication ([place])   . Hypertension   . Smoking history 03/10/2019   Smokes 1/2 pack a day    Past Surgical History:  Procedure Laterality Date  . ABDOMINAL HYSTERECTOMY    . ICD IMPLANT N/A 03/13/2019   Procedure: ICD IMPLANT;  Surgeon: Constance Haw, MD;  Location: [place] CV LAB;  Service: Cardiovascular;  Laterality: N/A;  . ICD IMPLANT  03/13/2019  . LEFT HEART CATH AND CORONARY ANGIOGRAPHY N/A 03/12/2019   Procedure: LEFT HEART CATH AND CORONARY ANGIOGRAPHY;  Surgeon: Wellington Hampshire, MD;  Location: [place] CV LAB;  Service: Cardiovascular;  Laterality: N/A;    Current Medications: Current Meds  Medication Sig  . Ascorbic Acid (VITAMIN C) 1000 MG tablet Take 1,000 mg by mouth 2 (two) times a day.  Marland Kitchen aspirin EC 81 MG tablet Take 1 tablet (81 mg total) by mouth daily.  Marland Kitchen atorvastatin (LIPITOR) 80 MG tablet  Take 1 tablet (80 mg total) by mouth daily at 6 PM.  . carvedilol (COREG) 3.125 MG tablet Take 1 tablet (3.125 mg total) by mouth 2 (two) times daily with a meal.  . DULoxetine (CYMBALTA) 30 MG capsule Take 30 mg by mouth at bedtime.  Marland Kitchen LANTUS SOLOSTAR 100 UNIT/ML Solostar Pen Inject 20 Units into the skin at bedtime.  Marland Kitchen LORazepam (ATIVAN) 0.5 MG tablet Take 0.5 mg by mouth 2 (two) times daily as needed for anxiety.  Marland Kitchen losartan (COZAAR) 25 MG tablet Take 25 mg by mouth daily.  . metFORMIN (GLUCOPHAGE) 1000 MG tablet Take 1,000 mg by mouth 2 (two) times a day.  . nitroGLYCERIN (NITROSTAT) 0.4 MG SL tablet Place 1 tablet (0.4 mg total) under the tongue every 5 (five) minutes as needed.  Marland Kitchen omeprazole (PRILOSEC) 20 MG capsule Take 20 mg by mouth daily as needed (acid reflux).  . Potassium Chloride ER 20 MEQ TBCR TAKE 1 TABLET BY MOUTH EVERY DAY     Allergies:   Shrimp [shellfish allergy] and Hydrocodone   Social History   Socioeconomic History  . Marital status: Single    Spouse name: Not on file  . Number of children: Not on file  . Years of education: Not on file  . Highest education level: Not on file  Occupational History  . Not on file  Social Needs  . Financial resource strain: Not on file  . Food insecurity    Worry: Not on file    Inability:  Not on file  . Transportation needs    Medical: Not on file    Non-medical: Not on file  Tobacco Use  . Smoking status: Former Smoker    Packs/day: 1.00    Years: 30.00    Pack years: 30.00    Types: Cigarettes  . Smokeless tobacco: Never Used  Substance and Sexual Activity  . Alcohol use: Not Currently  . Drug use: Never  . Sexual activity: Not on file  Lifestyle  . Physical activity    Days per week: Not on file    Minutes per session: Not on file  . Stress: Not on file  Relationships  . Social Herbalist on phone: Not on file    Gets together: Not on file    Attends religious service: Not on file    Active  member of club or organization: Not on file    Attends meetings of clubs or organizations: Not on file    Relationship status: Not on file  Other Topics Concern  . Not on file  Social History Narrative  . Not on file     Family History: The patient's family history includes Hypertension in her mother. ROS:   Please see the history of present illness.    All 14 point review of systems negative except as described per history of present illness  EKGs/Labs/Other Studies Reviewed:      Recent Labs: 03/13/2019: Hemoglobin 11.6; Magnesium 1.6; Platelets 258 03/25/2019: BUN 11; Creatinine, Ser 0.56; Potassium 4.6; Sodium 136  Recent Lipid Panel    Component Value Date/Time   CHOL 160 03/11/2019 0101   TRIG 111 03/11/2019 0101   HDL 43 03/11/2019 0101   CHOLHDL 3.7 03/11/2019 0101   VLDL 22 03/11/2019 0101   LDLCALC 95 03/11/2019 0101    Physical Exam:    VS:  BP 120/60 (BP Location: Right Arm, Patient Position: Sitting, Cuff Size: Normal)   Pulse (!) 102   Temp 98.4 F (36.9 C)   Ht 5' 4"  (1.626 m)   Wt 179 lb (81.2 kg)   SpO2 98%   BMI 30.73 kg/m     Wt Readings from Last 3 Encounters:  04/29/19 179 lb (81.2 kg)  03/21/19 179 lb 6.4 oz (81.4 kg)  03/15/19 181 lb 3.5 oz (82.2 kg)     GEN:  Well nourished, well developed in no acute distress HEENT: Normal NECK: No JVD; No carotid bruits LYMPHATICS: No lymphadenopathy CARDIAC: RRR, no murmurs, no rubs, no gallops RESPIRATORY:  Clear to auscultation without rales, wheezing or rhonchi  ABDOMEN: Soft, non-tender, non-distended MUSCULOSKELETAL:  No edema; No deformity  SKIN: Warm and dry LOWER EXTREMITIES: no swelling NEUROLOGIC:  Alert and oriented x 3 PSYCHIATRIC:  Normal affect   ASSESSMENT:    1. Ventricular tachycardia ([place])   2. Essential hypertension   3. Coronary artery disease involving native coronary artery of native heart without angina pectoris   4. Dyslipidemia   5. ICD (implantable  cardioverter-defibrillator) in place    PLAN:    In order of problems listed above:  1. Ventricular tachycardia.  On beta-blocker which I will continue.  No detected episodes in device.  I think contributing factor was the fact that she was having diarrhea and vomiting before event of sustained ventricular tachycardia.  We will continue beta-blockade. 2. Essential hypertension blood pressure controlled continue present management 3. Coronary disease with completely occluded circumflex artery continue present management Dyslipidemia she is on Lipitor 80 which  I will continue ICD present: Noted, no discharges from the device  We had a long discussion with her today.  There are a lot of questions she ask and all were answered.  She is asking me about driving and afraid she would not be able to drive for 6 months   Medication Adjustments/Labs and Tests Ordered: Current medicines are reviewed at length with the patient today.  Concerns regarding medicines are outlined above.  Orders Placed This Encounter  Procedures  . Comp Met (CMET)   Medication changes: No orders of the defined types were placed in this encounter.   Signed, Park Liter, MD, [facility] 04/29/2019 3:29 PM    [place]

## 2019-04-29 NOTE — Patient Instructions (Signed)
Medication Instructions:  Your physician recommends that you continue on your current medications as directed. Please refer to the Current Medication list given to you today.  If you need a refill on your cardiac medications before your next appointment, please call your pharmacy.   Lab work: Your physician recommends that you return for lab work today: CMP  If you have labs (blood work) drawn today and your tests are completely normal, you will receive your results only by: Marland Kitchen MyChart Message (if you have MyChart) OR . A paper copy in the mail If you have any lab test that is abnormal or we need to change your treatment, we will call you to review the results.  Testing/Procedures: None.   Follow-Up: At [facility], you and your health needs are our priority.  As part of our continuing mission to provide you with exceptional heart care, we have created designated Provider Care Teams.  These Care Teams include your primary Cardiologist (physician) and Advanced Practice Providers (APPs -  Physician Assistants and Nurse Practitioners) who all work together to provide you with the care you need, when you need it. You will need a follow up appointment in 6 weeks.  Please call our office 2 months in advance to schedule this appointment.  You may see No primary care provider on file. or another member of our [employer] Provider Team in [place]: Shirlee More, MD . Jyl Heinz, MD  Any Other Special Instructions Will Be Listed Below (If Applicable).

## 2019-04-30 ENCOUNTER — Telehealth: Payer: Self-pay | Admitting: Emergency Medicine

## 2019-04-30 LAB — COMPREHENSIVE METABOLIC PANEL
ALT: 19 IU/L (ref 0–32)
AST: 13 IU/L (ref 0–40)
Albumin/Globulin Ratio: 1.8 (ref 1.2–2.2)
Albumin: 4.5 g/dL (ref 3.8–4.9)
Alkaline Phosphatase: 106 IU/L (ref 39–117)
BUN/Creatinine Ratio: 26 — ABNORMAL HIGH (ref 9–23)
BUN: 17 mg/dL (ref 6–24)
Bilirubin Total: 0.2 mg/dL (ref 0.0–1.2)
CO2: 20 mmol/L (ref 20–29)
Calcium: 9.6 mg/dL (ref 8.7–10.2)
Chloride: 91 mmol/L — ABNORMAL LOW (ref 96–106)
Creatinine, Ser: 0.65 mg/dL (ref 0.57–1.00)
GFR calc Af Amer: 112 mL/min/{1.73_m2} (ref >59)
GFR calc non Af Amer: 97 mL/min/{1.73_m2} (ref >59)
Globulin, Total: 2.5 g/dL (ref 1.5–4.5)
Glucose: 369 mg/dL — ABNORMAL HIGH (ref 65–99)
Potassium: 4.6 mmol/L (ref 3.5–5.2)
Sodium: 134 mmol/L (ref 134–144)
Total Protein: 7 g/dL (ref 6.0–8.5)

## 2019-04-30 NOTE — Telephone Encounter (Signed)
Patient informed not to drive for 6 months per Dr. Curt Bears and Dr. Agustin Cree.

## 2019-05-01 ENCOUNTER — Ambulatory Visit: Payer: BC Managed Care – PPO | Admitting: Cardiology

## 2019-06-07 ENCOUNTER — Other Ambulatory Visit: Payer: Self-pay | Admitting: Physician Assistant

## 2019-06-11 DIAGNOSIS — E1121 Type 2 diabetes mellitus with diabetic nephropathy: Secondary | ICD-10-CM | POA: Diagnosis not present

## 2019-06-11 DIAGNOSIS — I110 Hypertensive heart disease with heart failure: Secondary | ICD-10-CM | POA: Diagnosis not present

## 2019-06-11 DIAGNOSIS — K219 Gastro-esophageal reflux disease without esophagitis: Secondary | ICD-10-CM | POA: Diagnosis not present

## 2019-06-11 DIAGNOSIS — E782 Mixed hyperlipidemia: Secondary | ICD-10-CM | POA: Diagnosis not present

## 2019-06-11 DIAGNOSIS — E083413 Diabetes mellitus due to underlying condition with severe nonproliferative diabetic retinopathy with macular edema, bilateral: Secondary | ICD-10-CM | POA: Diagnosis not present

## 2019-06-12 ENCOUNTER — Encounter: Payer: Self-pay | Admitting: Cardiology

## 2019-06-12 ENCOUNTER — Other Ambulatory Visit: Payer: Self-pay

## 2019-06-12 ENCOUNTER — Ambulatory Visit (INDEPENDENT_AMBULATORY_CARE_PROVIDER_SITE_OTHER): Payer: BC Managed Care – PPO | Admitting: Cardiology

## 2019-06-12 VITALS — BP 134/62 | HR 90 | Wt 190.2 lb

## 2019-06-12 DIAGNOSIS — Z95810 Presence of automatic (implantable) cardiac defibrillator: Secondary | ICD-10-CM | POA: Diagnosis not present

## 2019-06-12 DIAGNOSIS — I10 Essential (primary) hypertension: Secondary | ICD-10-CM

## 2019-06-12 DIAGNOSIS — I472 Ventricular tachycardia, unspecified: Secondary | ICD-10-CM

## 2019-06-12 DIAGNOSIS — I2510 Atherosclerotic heart disease of native coronary artery without angina pectoris: Secondary | ICD-10-CM

## 2019-06-12 NOTE — Progress Notes (Signed)
Cardiology Office Note:    Date:  06/12/2019   ID:  Melinda Nguyen, DOB [birth year], MRN [id number]  PCP:  Rochel Brome, MD  Cardiologist:  Jenne Campus, MD    Referring MD: Rochel Brome, MD   Chief Complaint  Patient presents with  . Follow-up    History of Present Illness:    Melinda Nguyen is a 58 y.o. female with history of coronary artery disease completely occluded circumflex artery, status post ICD secondary to ventricle tachycardia.  Doing well denies have any palpitation chest pain tightness pressure burning chest no dizziness no past [place]  Past Medical History:  Diagnosis Date  . AICD (automatic cardioverter/defibrillator) present 03/13/2019  . Diabetes mellitus without complication ([place])   . Hypertension   . Smoking history 03/10/2019   Smokes 1/2 pack a day    Past Surgical History:  Procedure Laterality Date  . ABDOMINAL HYSTERECTOMY    . ICD IMPLANT N/A 03/13/2019   Procedure: ICD IMPLANT;  Surgeon: Constance Haw, MD;  Location: [place] [facility];  Service: Cardiovascular;  Laterality: N/A;  . ICD IMPLANT  03/13/2019  . LEFT HEART CATH AND CORONARY ANGIOGRAPHY N/A 03/12/2019   Procedure: LEFT HEART CATH AND CORONARY ANGIOGRAPHY;  Surgeon: Wellington Hampshire, MD;  Location: [place] [facility];  Service: Cardiovascular;  Laterality: N/A;    Current Medications: Current Meds  Medication Sig  . Ascorbic Acid (VITAMIN C) 1000 MG tablet Take 1,000 mg by mouth 2 (two) times a day.  Marland Kitchen aspirin EC 81 MG tablet Take 1 tablet (81 mg total) by mouth daily.  Marland Kitchen atorvastatin (LIPITOR) 80 MG tablet Take 1 tablet (80 mg total) by mouth daily at 6 PM.  . carvedilol (COREG) 3.125 MG tablet TAKE 1 TABLET (3.125 MG TOTAL) BY MOUTH 2 (TWO) TIMES DAILY WITH A MEAL.  . DULoxetine (CYMBALTA) 30 MG capsule Take 30 mg by mouth at bedtime.  . furosemide (LASIX) 40 MG tablet Take 1 tablet by mouth daily.  Marland Kitchen LANTUS SOLOSTAR 100 UNIT/ML Solostar Pen Inject 20  Units into the skin at bedtime.  Marland Kitchen LORazepam (ATIVAN) 0.5 MG tablet Take 0.5 mg by mouth 2 (two) times daily as needed for anxiety.  Marland Kitchen losartan (COZAAR) 25 MG tablet Take 25 mg by mouth daily.  . metFORMIN (GLUCOPHAGE) 1000 MG tablet Take 1,000 mg by mouth 2 (two) times a day.  . nitroGLYCERIN (NITROSTAT) 0.4 MG SL tablet Place 1 tablet (0.4 mg total) under the tongue every 5 (five) minutes as needed.  Marland Kitchen omeprazole (PRILOSEC) 20 MG capsule Take 20 mg by mouth daily as needed (acid reflux).  . Potassium Chloride ER 20 MEQ TBCR TAKE 1 TABLET BY MOUTH EVERY DAY     Allergies:   Shrimp [shellfish allergy] and Hydrocodone   Social History   Socioeconomic History  . Marital status: Single    Spouse name: Not on file  . Number of children: Not on file  . Years of education: Not on file  . Highest education level: Not on file  Occupational History  . Not on file  Social Needs  . Financial resource strain: Not on file  . Food insecurity    Worry: Not on file    Inability: Not on file  . Transportation needs    Medical: Not on file    Non-medical: Not on file  Tobacco Use  . Smoking status: Former Smoker    Packs/day: 1.00    Years:  30.00    Pack years: 30.00    Types: Cigarettes  . Smokeless tobacco: Never Used  Substance and Sexual Activity  . Alcohol use: Not Currently  . Drug use: Never  . Sexual activity: Not on file  Lifestyle  . Physical activity    Days per week: Not on file    Minutes per session: Not on file  . Stress: Not on file  Relationships  . Social Musician on phone: Not on file    Gets together: Not on file    Attends religious service: Not on file    Active member of club or organization: Not on file    Attends meetings of clubs or organizations: Not on file    Relationship status: Not on file  Other Topics Concern  . Not on file  Social History Narrative  . Not on file     Family History: The patient's family history includes  Hypertension in her mother. ROS:   Please see the history of present illness.    All 14 point review of systems negative except as described per history of present illness  EKGs/Labs/Other Studies Reviewed:      Recent Labs: 03/13/2019: Hemoglobin 11.6; Magnesium 1.6; Platelets 258 04/29/2019: ALT 19; BUN 17; Creatinine, Ser 0.65; Potassium 4.6; Sodium 134  Recent Lipid Panel    Component Value Date/Time   CHOL 160 03/11/2019 0101   TRIG 111 03/11/2019 0101   HDL 43 03/11/2019 0101   CHOLHDL 3.7 03/11/2019 0101   VLDL 22 03/11/2019 0101   LDLCALC 95 03/11/2019 0101    Physical Exam:    VS:  BP 134/62   Pulse 90   Wt 190 lb 3.2 oz (86.3 kg)   SpO2 98%   BMI 32.65 kg/m     Wt Readings from Last 3 Encounters:  06/12/19 190 lb 3.2 oz (86.3 kg)  04/29/19 179 lb (81.2 kg)  03/21/19 179 lb 6.4 oz (81.4 kg)     GEN:  Well nourished, well developed in no acute distress HEENT: Normal NECK: No JVD; No carotid bruits LYMPHATICS: No lymphadenopathy CARDIAC: RRR, no murmurs, no rubs, no gallops RESPIRATORY:  Clear to auscultation without rales, wheezing or rhonchi  ABDOMEN: Soft, non-tender, non-distended MUSCULOSKELETAL:  No edema; No deformity  SKIN: Warm and dry LOWER EXTREMITIES: no swelling NEUROLOGIC:  Alert and oriented x 3 PSYCHIATRIC:  Normal affect   ASSESSMENT:    1. Ventricular tachycardia (HCC)   2. Coronary artery disease involving native coronary artery of native heart without angina pectoris   3. Essential hypertension   4. ICD (implantable cardioverter-defibrillator) in place    PLAN:    In order of problems listed above:  1. Ventricular tachycardia history of.  No more arrhythmia ICD present.  No discharges. 2. Coronary disease stable on appropriate medications asymptomatic continue present management 3. Essential hypertension blood pressure well controlled continue present management. 4. ICD present followed by our EP team.   Medication  Adjustments/Labs and Tests Ordered: Current medicines are reviewed at length with the patient today.  Concerns regarding medicines are outlined above.  No orders of the defined types were placed in this encounter.  Medication changes: No orders of the defined types were placed in this encounter.   Signed, Georgeanna Lea, MD, [facility] 06/12/2019 2:22 PM    [facility]

## 2019-06-12 NOTE — Patient Instructions (Signed)
Medication Instructions:  Your physician recommends that you continue on your current medications as directed. Please refer to the Current Medication list given to you today.  If you need a refill on your cardiac medications before your next appointment, please call your pharmacy.   Lab work: None ordered If you have labs (blood work) drawn today and your tests are completely normal, you will receive your results only by: . MyChart Message (if you have MyChart) OR . A paper copy in the mail If you have any lab test that is abnormal or we need to change your treatment, we will call you to review the results.  Testing/Procedures: None ordered  Follow-Up: At [facility], you and your health needs are our priority.  As part of our continuing mission to provide you with exceptional heart care, we have created designated Provider Care Teams.  These Care Teams include your primary Cardiologist (physician) and Advanced Practice Providers (APPs -  Physician Assistants and Nurse Practitioners) who all work together to provide you with the care you need, when you need it. You will need a follow up appointment in 3 months.  You may see  Robert Krasowski or another member of our [facility] Provider Team in [place]: Brian Munley, MD . Rajan Revankar, MD    

## 2019-06-13 ENCOUNTER — Ambulatory Visit (INDEPENDENT_AMBULATORY_CARE_PROVIDER_SITE_OTHER): Payer: BC Managed Care – PPO | Admitting: *Deleted

## 2019-06-13 DIAGNOSIS — I472 Ventricular tachycardia, unspecified: Secondary | ICD-10-CM

## 2019-06-17 LAB — CUP PACEART REMOTE DEVICE CHECK
Battery Remaining Longevity: 88 mo
Battery Remaining Percentage: 93 %
Battery Voltage: 3.17 V
Brady Statistic RV Percent Paced: 1 %
Date Time Interrogation Session: 20200908021405
HighPow Impedance: 73 Ohm
HighPow Impedance: 73 Ohm
Implantable Lead Implant Date: 20200604
Implantable Lead Location: 753860
Implantable Pulse Generator Implant Date: 20200604
Lead Channel Impedance Value: 480 Ohm
Lead Channel Pacing Threshold Amplitude: 1 V
Lead Channel Pacing Threshold Pulse Width: 0.5 ms
Lead Channel Sensing Intrinsic Amplitude: 8.1 mV
Lead Channel Setting Pacing Amplitude: 3.5 V
Lead Channel Setting Pacing Pulse Width: 0.5 ms
Lead Channel Setting Sensing Sensitivity: 0.5 mV
Pulse Gen Serial Number: 9860889

## 2019-06-25 ENCOUNTER — Encounter: Payer: Self-pay | Admitting: Cardiology

## 2019-06-25 ENCOUNTER — Other Ambulatory Visit: Payer: Self-pay | Admitting: Cardiology

## 2019-06-25 NOTE — Progress Notes (Signed)
Remote ICD transmission.   

## 2019-06-25 NOTE — Addendum Note (Signed)
Addended by: Tiajuana Amass on: 06/25/2019 09:58 AM   Modules accepted: Level of Service

## 2019-09-12 ENCOUNTER — Ambulatory Visit (INDEPENDENT_AMBULATORY_CARE_PROVIDER_SITE_OTHER): Payer: BC Managed Care – PPO | Admitting: *Deleted

## 2019-09-12 DIAGNOSIS — Z95810 Presence of automatic (implantable) cardiac defibrillator: Secondary | ICD-10-CM | POA: Diagnosis not present

## 2019-09-13 LAB — CUP PACEART REMOTE DEVICE CHECK
Battery Remaining Longevity: 87 mo
Battery Remaining Percentage: 91 %
Battery Voltage: 3.14 V
Brady Statistic RV Percent Paced: 1 %
Date Time Interrogation Session: 20201204142137
HighPow Impedance: 82 Ohm
HighPow Impedance: 82 Ohm
Implantable Lead Implant Date: 20200604
Implantable Lead Location: 753860
Implantable Pulse Generator Implant Date: 20200604
Lead Channel Impedance Value: 550 Ohm
Lead Channel Pacing Threshold Amplitude: 1 V
Lead Channel Pacing Threshold Pulse Width: 0.5 ms
Lead Channel Sensing Intrinsic Amplitude: 6.9 mV
Lead Channel Setting Pacing Amplitude: 3.5 V
Lead Channel Setting Pacing Pulse Width: 0.5 ms
Lead Channel Setting Sensing Sensitivity: 0.5 mV
Pulse Gen Serial Number: 9860889

## 2019-09-15 ENCOUNTER — Telehealth: Payer: Self-pay | Admitting: *Deleted

## 2019-09-15 DIAGNOSIS — E782 Mixed hyperlipidemia: Secondary | ICD-10-CM | POA: Diagnosis not present

## 2019-09-15 DIAGNOSIS — I10 Essential (primary) hypertension: Secondary | ICD-10-CM | POA: Diagnosis not present

## 2019-09-15 DIAGNOSIS — E1169 Type 2 diabetes mellitus with other specified complication: Secondary | ICD-10-CM | POA: Diagnosis not present

## 2019-09-15 DIAGNOSIS — K219 Gastro-esophageal reflux disease without esophagitis: Secondary | ICD-10-CM | POA: Diagnosis not present

## 2019-09-15 DIAGNOSIS — I110 Hypertensive heart disease with heart failure: Secondary | ICD-10-CM | POA: Diagnosis not present

## 2019-09-15 DIAGNOSIS — E1121 Type 2 diabetes mellitus with diabetic nephropathy: Secondary | ICD-10-CM | POA: Diagnosis not present

## 2019-09-15 DIAGNOSIS — E083413 Diabetes mellitus due to underlying condition with severe nonproliferative diabetic retinopathy with macular edema, bilateral: Secondary | ICD-10-CM | POA: Diagnosis not present

## 2019-09-15 NOTE — Telephone Encounter (Signed)
-----   Message from Will Martin Camnitz, MD sent at 09/15/2019 11:31 AM EST ----- Abnormal device interrogation reviewed.  Lead parameters and battery status stable.  NSVT episode. Increase coreg to 6.25 mg BID 

## 2019-09-15 NOTE — Telephone Encounter (Signed)
I s/w pt and went over recommendations to increase her coreg to 6.25 mg BID. Pt asked when did he see the NSVT? I advised I was not sure. Pt states her husband just a passed away about 1 and 1/2 hours ago today. She states she does not want to increase the coreg yet as she feels this may have come from her husband passing away. Pt states she will let Dr. Curt Bears know if it happens again and she will then increase the coreg. She states she has appt with Dr. Agustin Cree next week and she will d/w him as well. She knows that she has a lot ahead of her planning her husbands funeral. I assured her that I will Dr. Curt Bears and his nurse Venida Jarvis as well as Dr. Agustin Cree know of our conversation today. Pt was very weepy. The patient has been notified of the result and verbalized understanding.  All questions (if any) were answered. Julaine Hua, [place] 09/15/2019 2:45 PM

## 2019-09-15 NOTE — Telephone Encounter (Signed)
-----   Message from Will Meredith Leeds, MD sent at 09/15/2019 11:31 AM EST ----- Abnormal device interrogation reviewed.  Lead parameters and battery status stable.  NSVT episode. Increase coreg to 6.25 mg BID

## 2019-09-15 NOTE — Telephone Encounter (Signed)
Dr Camnitz made aware. 

## 2019-09-15 NOTE — Telephone Encounter (Signed)
Pt states husband had Pancreatic cancer and passed away 1 and 1/2 hours ago today.

## 2019-09-22 ENCOUNTER — Encounter: Payer: Self-pay | Admitting: Cardiology

## 2019-09-22 ENCOUNTER — Other Ambulatory Visit: Payer: Self-pay

## 2019-09-22 ENCOUNTER — Ambulatory Visit (INDEPENDENT_AMBULATORY_CARE_PROVIDER_SITE_OTHER): Payer: BC Managed Care – PPO | Admitting: Cardiology

## 2019-09-22 VITALS — BP 140/70 | HR 92 | Ht 64.0 in | Wt 201.4 lb

## 2019-09-22 DIAGNOSIS — I10 Essential (primary) hypertension: Secondary | ICD-10-CM | POA: Diagnosis not present

## 2019-09-22 DIAGNOSIS — I472 Ventricular tachycardia, unspecified: Secondary | ICD-10-CM

## 2019-09-22 DIAGNOSIS — Z95810 Presence of automatic (implantable) cardiac defibrillator: Secondary | ICD-10-CM

## 2019-09-22 DIAGNOSIS — I2510 Atherosclerotic heart disease of native coronary artery without angina pectoris: Secondary | ICD-10-CM | POA: Diagnosis not present

## 2019-09-22 MED ORDER — CARVEDILOL 6.25 MG PO TABS: 6.2500 mg | ORAL_TABLET | Freq: Two times a day (BID) | ORAL | 1 refills | Status: DC

## 2019-09-22 NOTE — Progress Notes (Signed)
Cardiology Office Note:    Date:  09/22/2019   ID:  Melinda Nguyen, DOB Aug 31, 1961, MRN [id number]  PCP:  Rochel Brome, MD  Cardiologist:  Jenne Campus, MD    Referring MD: Rochel Brome, MD   Chief Complaint  Patient presents with  . Follow-up  I am grieving after my husband passing  History of Present Illness:    Melinda Nguyen is a 58 y.o. female with past medical history significant for ICD, diabetes, hypertension.  Recently we noted presence of ventricular tachycardia on her ICD and we advised her to increase dose of carvedilol however when we call her to tell her about this she was crying because her husband just passed away about an hour and a half ago.  She still crying a lot this is more than week after he passed overall seems to be doing well but described 1 episode of palpitations and she think this is the time that we recorded also ventricular tachycardia.  She agreed to increase dose of carvedilol we will watch interrogation of ICD.  Past Medical History:  Diagnosis Date  . AICD (automatic cardioverter/defibrillator) present 03/13/2019  . Diabetes mellitus without complication ([place])   . Hypertension   . Smoking history 03/10/2019   Smokes 1/2 pack a day    Past Surgical History:  Procedure Laterality Date  . ABDOMINAL HYSTERECTOMY    . ICD IMPLANT N/A 03/13/2019   Procedure: ICD IMPLANT;  Surgeon: Constance Haw, MD;  Location: [place] [facility];  Service: Cardiovascular;  Laterality: N/A;  . ICD IMPLANT  03/13/2019  . LEFT HEART CATH AND CORONARY ANGIOGRAPHY N/A 03/12/2019   Procedure: LEFT HEART CATH AND CORONARY ANGIOGRAPHY;  Surgeon: Wellington Hampshire, MD;  Location: [place] [facility];  Service: Cardiovascular;  Laterality: N/A;    Current Medications: Current Meds  Medication Sig  . Ascorbic Acid (VITAMIN C) 1000 MG tablet Take 1,000 mg by mouth 2 (two) times a day.  Marland Kitchen aspirin EC 81 MG tablet Take 1 tablet (81 mg total) by mouth daily.  Marland Kitchen atorvastatin  (LIPITOR) 80 MG tablet Take 1 tablet (80 mg total) by mouth daily at 6 PM.  . carvedilol (COREG) 6.25 MG tablet Take 1 tablet (6.25 mg total) by mouth 2 (two) times daily with a meal.  . DULoxetine (CYMBALTA) 30 MG capsule Take 30 mg by mouth at bedtime.  . furosemide (LASIX) 40 MG tablet Take 1 tablet by mouth daily.  Marland Kitchen LANTUS SOLOSTAR 100 UNIT/ML Solostar Pen Inject 20 Units into the skin at bedtime.  Marland Kitchen LORazepam (ATIVAN) 0.5 MG tablet Take 0.5 mg by mouth 2 (two) times daily as needed for anxiety.  Marland Kitchen losartan (COZAAR) 25 MG tablet TAKE 1 TABLET BY MOUTH EVERY DAY  . metFORMIN (GLUCOPHAGE) 1000 MG tablet Take 1,000 mg by mouth 2 (two) times a day.  . nitroGLYCERIN (NITROSTAT) 0.4 MG SL tablet Place 1 tablet (0.4 mg total) under the tongue every 5 (five) minutes as needed.  Marland Kitchen omeprazole (PRILOSEC) 20 MG capsule Take 20 mg by mouth daily as needed (acid reflux).  . Potassium Chloride ER 20 MEQ TBCR TAKE 1 TABLET BY MOUTH EVERY DAY  . [DISCONTINUED] carvedilol (COREG) 3.125 MG tablet TAKE 1 TABLET (3.125 MG TOTAL) BY MOUTH 2 (TWO) TIMES DAILY WITH A MEAL.     Allergies:   Shrimp [shellfish allergy] and Hydrocodone   Social History   Socioeconomic History  . Marital status: Single    Spouse name: Not on file  .  Number of children: Not on file  . Years of education: Not on file  . Highest education level: Not on file  Occupational History  . Not on file  Tobacco Use  . Smoking status: Former Smoker    Packs/day: 1.00    Years: 30.00    Pack years: 30.00    Types: Cigarettes  . Smokeless tobacco: Never Used  Substance and Sexual Activity  . Alcohol use: Not Currently  . Drug use: Never  . Sexual activity: Not on file  Other Topics Concern  . Not on file  Social History Narrative  . Not on file   Social Determinants of Health   Financial Resource Strain:   . Difficulty of Paying Living Expenses: Not on file  Food Insecurity:   . Worried About Programme researcher, broadcasting/film/video in the Last  Year: Not on file  . Ran Out of Food in the Last Year: Not on file  Transportation Needs:   . Lack of Transportation (Medical): Not on file  . Lack of Transportation (Non-Medical): Not on file  Physical Activity:   . Days of Exercise per Week: Not on file  . Minutes of Exercise per Session: Not on file  Stress:   . Feeling of Stress : Not on file  Social Connections:   . Frequency of Communication with Friends and Family: Not on file  . Frequency of Social Gatherings with Friends and Family: Not on file  . Attends Religious Services: Not on file  . Active Member of Clubs or Organizations: Not on file  . Attends Banker Meetings: Not on file  . Marital Status: Not on file     Family History: The patient's family history includes Hypertension in her mother. ROS:   Please see the history of present illness.    All 14 point review of systems negative except as described per history of present illness  EKGs/Labs/Other Studies Reviewed:      Recent Labs: 03/13/2019: Hemoglobin 11.6; Magnesium 1.6; Platelets 258 04/29/2019: ALT 19; BUN 17; Creatinine, Ser 0.65; Potassium 4.6; Sodium 134  Recent Lipid Panel    Component Value Date/Time   CHOL 160 03/11/2019 0101   TRIG 111 03/11/2019 0101   HDL 43 03/11/2019 0101   CHOLHDL 3.7 03/11/2019 0101   VLDL 22 03/11/2019 0101   LDLCALC 95 03/11/2019 0101    Physical Exam:    VS:  BP 140/70   Pulse 92   Ht 5\' 4"  (1.626 m)   Wt 201 lb 6.4 oz (91.4 kg)   SpO2 96%   BMI 34.57 kg/m     Wt Readings from Last 3 Encounters:  09/22/19 201 lb 6.4 oz (91.4 kg)  06/12/19 190 lb 3.2 oz (86.3 kg)  04/29/19 179 lb (81.2 kg)     GEN:  Well nourished, well developed in no acute distress HEENT: Normal NECK: No JVD; No carotid bruits LYMPHATICS: No lymphadenopathy CARDIAC: RRR, no murmurs, no rubs, no gallops RESPIRATORY:  Clear to auscultation without rales, wheezing or rhonchi  ABDOMEN: Soft, non-tender,  non-distended MUSCULOSKELETAL:  No edema; No deformity  SKIN: Warm and dry LOWER EXTREMITIES: no swelling NEUROLOGIC:  Alert and oriented x 3 PSYCHIATRIC:  Normal affect   ASSESSMENT:    1. Ventricular tachycardia (HCC)   2. Essential hypertension   3. Coronary artery disease involving native coronary artery of native heart without angina pectoris   4. ICD (implantable cardioverter-defibrillator) in place    PLAN:    In order  of problems listed above:  1. Ventricular tachycardia we will increase beta-blocker. 2. Essential hypertension blood pressure well controlled 3. Coronary disease stable 4. ICD present, interrogation reviewed   Medication Adjustments/Labs and Tests Ordered: Current medicines are reviewed at length with the patient today.  Concerns regarding medicines are outlined above.  No orders of the defined types were placed in this encounter.  Medication changes:  Meds ordered this encounter  Medications  . carvedilol (COREG) 6.25 MG tablet    Sig: Take 1 tablet (6.25 mg total) by mouth 2 (two) times daily with a meal.    Dispense:  60 tablet    Refill:  1    Signed, Georgeanna Lea, MD, [facility] 09/22/2019 4:25 PM    [facility]

## 2019-09-22 NOTE — Patient Instructions (Signed)
Medication Instructions:  Your physician has recommended you make the following change in your medication:   INCREASE: Carvedilol to 6.25 mg twice daily   *If you need a refill on your cardiac medications before your next appointment, please call your pharmacy*  Lab Work: None.  If you have labs (blood work) drawn today and your tests are completely normal, you will receive your results only by: Marland Kitchen MyChart Message (if you have MyChart) OR . A paper copy in the mail If you have any lab test that is abnormal or we need to change your treatment, we will call you to review the results.  Testing/Procedures: None.   Follow-Up: At [facility], you and your health needs are our priority.  As part of our continuing mission to provide you with exceptional heart care, we have created designated Provider Care Teams.  These Care Teams include your primary Cardiologist (physician) and Advanced Practice Providers (APPs -  Physician Assistants and Nurse Practitioners) who all work together to provide you with the care you need, when you need it.  Your next appointment:   3 month(s)  The format for your next appointment:   In Person  Provider:   Jenne Campus, MD  Other Instructions

## 2019-10-15 ENCOUNTER — Other Ambulatory Visit: Payer: Self-pay | Admitting: Cardiology

## 2019-11-18 ENCOUNTER — Other Ambulatory Visit: Payer: Self-pay

## 2019-11-18 MED ORDER — DULOXETINE HCL 30 MG PO CPEP: 30.0000 mg | ORAL_CAPSULE | Freq: Every day | ORAL | 1 refills | Status: DC

## 2019-12-12 ENCOUNTER — Ambulatory Visit (INDEPENDENT_AMBULATORY_CARE_PROVIDER_SITE_OTHER): Payer: BC Managed Care – PPO | Admitting: *Deleted

## 2019-12-12 DIAGNOSIS — Z95810 Presence of automatic (implantable) cardiac defibrillator: Secondary | ICD-10-CM | POA: Diagnosis not present

## 2019-12-12 LAB — CUP PACEART REMOTE DEVICE CHECK
Battery Remaining Longevity: 86 mo
Battery Remaining Percentage: 90 %
Battery Voltage: 3.1 V
Brady Statistic RV Percent Paced: 1 %
Date Time Interrogation Session: 20210305020014
HighPow Impedance: 83 Ohm
HighPow Impedance: 83 Ohm
Implantable Lead Implant Date: 20200604
Implantable Lead Location: 753860
Implantable Pulse Generator Implant Date: 20200604
Lead Channel Impedance Value: 550 Ohm
Lead Channel Pacing Threshold Amplitude: 1 V
Lead Channel Pacing Threshold Pulse Width: 0.5 ms
Lead Channel Sensing Intrinsic Amplitude: 8.1 mV
Lead Channel Setting Pacing Amplitude: 3.5 V
Lead Channel Setting Pacing Pulse Width: 0.5 ms
Lead Channel Setting Sensing Sensitivity: 0.5 mV
Pulse Gen Serial Number: 9860889

## 2019-12-12 NOTE — Progress Notes (Signed)
ICD Remote  

## 2019-12-16 ENCOUNTER — Ambulatory Visit: Payer: BC Managed Care – PPO | Admitting: Family Medicine

## 2019-12-24 ENCOUNTER — Other Ambulatory Visit: Payer: Self-pay

## 2019-12-24 MED ORDER — CARVEDILOL 3.125 MG PO TABS: 3.1250 mg | ORAL_TABLET | Freq: Two times a day (BID) | ORAL | 1 refills | Status: DC

## 2019-12-29 ENCOUNTER — Ambulatory Visit: Payer: BC Managed Care – PPO | Admitting: Family Medicine

## 2020-01-06 ENCOUNTER — Other Ambulatory Visit: Payer: Self-pay

## 2020-01-06 ENCOUNTER — Encounter: Payer: Self-pay | Admitting: Cardiology

## 2020-01-06 ENCOUNTER — Ambulatory Visit (INDEPENDENT_AMBULATORY_CARE_PROVIDER_SITE_OTHER): Payer: BC Managed Care – PPO | Admitting: Cardiology

## 2020-01-06 VITALS — BP 138/70 | HR 96 | Ht 64.0 in | Wt 202.0 lb

## 2020-01-06 DIAGNOSIS — I472 Ventricular tachycardia, unspecified: Secondary | ICD-10-CM

## 2020-01-06 DIAGNOSIS — I255 Ischemic cardiomyopathy: Secondary | ICD-10-CM

## 2020-01-06 DIAGNOSIS — E785 Hyperlipidemia, unspecified: Secondary | ICD-10-CM

## 2020-01-06 DIAGNOSIS — Z95810 Presence of automatic (implantable) cardiac defibrillator: Secondary | ICD-10-CM | POA: Diagnosis not present

## 2020-01-06 DIAGNOSIS — I10 Essential (primary) hypertension: Secondary | ICD-10-CM

## 2020-01-06 MED ORDER — LOSARTAN POTASSIUM 25 MG PO TABS: 25.0000 mg | ORAL_TABLET | Freq: Every day | ORAL | 3 refills | Status: DC

## 2020-01-06 NOTE — Progress Notes (Signed)
Cardiology Office Note:    Date:  01/06/2020   ID:  Melinda Nguyen, DOB 01/30/1961, MRN [id number]  PCP:  Rochel Brome, MD  Cardiologist:  Jenne Campus, MD    Referring MD: Rochel Brome, MD   Chief Complaint  Patient presents with  . Follow-up    3 Months  Am doing fine  History of Present Illness:    Melinda Nguyen is a 59 y.o. female with past medical history significant for coronary artery disease, cardiac catheterization 1 year ago showed completely occluded circumflex artery, she does have also ischemic cardiomyopathy with ejection fraction 40 to 45%.  ICD present as well.  Few months ago she had ventricular tachycardia.  Since December there is no more recurrences of any arrhythmia.  Also about 3 months ago in December tragedy strikes she lost her husband and still grieving after that.  Sadly she went back to smoking.  She also stopped her atorvastatin.  We had a long discussion about the situation today.  Overall she is doing fair still grieving about the passing of her husband.  [place] [address] [place] to [place] the place.  She got some again purpose in life.  Denies have any cardiac complaints she does have some swelling of lower extremities in the meantime.  Past Medical History:  Diagnosis Date  . AICD (automatic cardioverter/defibrillator) present 03/13/2019  . Diabetes mellitus without complication ([place])   . Hypertension   . Smoking history 03/10/2019   Smokes 1/2 pack a day    Past Surgical History:  Procedure Laterality Date  . ABDOMINAL HYSTERECTOMY    . ICD IMPLANT N/A 03/13/2019   Procedure: ICD IMPLANT;  Surgeon: Constance Haw, MD;  Location: [facility];  Service: Cardiovascular;  Laterality: N/A;  . ICD IMPLANT  03/13/2019  . LEFT HEART CATH AND CORONARY ANGIOGRAPHY N/A 03/12/2019   Procedure: LEFT HEART CATH AND CORONARY ANGIOGRAPHY;  Surgeon: Wellington Hampshire, MD;  Location: [facility];   Service: Cardiovascular;  Laterality: N/A;    Current Medications: Current Meds  Medication Sig  . Ascorbic Acid (VITAMIN C) 1000 MG tablet Take 1,000 mg by mouth 2 (two) times a day.  Marland Kitchen aspirin EC 81 MG tablet Take 1 tablet (81 mg total) by mouth daily.  . carvedilol (COREG) 3.125 MG tablet Take 1 tablet (3.125 mg total) by mouth 2 (two) times daily with a meal.  . DULoxetine (CYMBALTA) 30 MG capsule Take 1 capsule (30 mg total) by mouth at bedtime.  Marland Kitchen LANTUS SOLOSTAR 100 UNIT/ML Solostar Pen Inject 20 Units into the skin at bedtime.  Marland Kitchen LORazepam (ATIVAN) 0.5 MG tablet Take 0.5 mg by mouth 2 (two) times daily as needed for anxiety.  Marland Kitchen losartan (COZAAR) 25 MG tablet TAKE 1 TABLET BY MOUTH EVERY DAY  . metFORMIN (GLUCOPHAGE) 1000 MG tablet Take 1,000 mg by mouth 2 (two) times a day.  . nitroGLYCERIN (NITROSTAT) 0.4 MG SL tablet Place 1 tablet (0.4 mg total) under the tongue every 5 (five) minutes as needed.  Marland Kitchen omeprazole (PRILOSEC) 20 MG capsule Take 20 mg by mouth daily as needed (acid reflux).     Allergies:   Shrimp [shellfish allergy] and Hydrocodone   Social History   Socioeconomic History  . Marital status: Single    Spouse name: Not on file  . Number of children: Not on file  . Years of education: Not on file  . Highest education level: Not  on file  Occupational History  . Not on file  Tobacco Use  . Smoking status: Former Smoker    Packs/day: 1.00    Years: 30.00    Pack years: 30.00    Types: Cigarettes  . Smokeless tobacco: Never Used  Substance and Sexual Activity  . Alcohol use: Not Currently  . Drug use: Never  . Sexual activity: Not on file  Other Topics Concern  . Not on file  Social History Narrative  . Not on file   Social Determinants of Health   Financial Resource Strain:   . Difficulty of Paying Living Expenses:   Food Insecurity:   . Worried About Programme researcher, broadcasting/film/video in the Last Year:   . Barista in the Last Year:   Transportation  Needs:   . Freight forwarder (Medical):   Marland Kitchen Lack of Transportation (Non-Medical):   Physical Activity:   . Days of Exercise per Week:   . Minutes of Exercise per Session:   Stress:   . Feeling of Stress :   Social Connections:   . Frequency of Communication with Friends and Family:   . Frequency of Social Gatherings with Friends and Family:   . Attends Religious Services:   . Active Member of Clubs or Organizations:   . Attends Banker Meetings:   Marland Kitchen Marital Status:      Family History: The patient's family history includes Hypertension in her mother. ROS:   Please see the history of present illness.    All 14 point review of systems negative except as described per history of present illness  EKGs/Labs/Other Studies Reviewed:      Recent Labs: 03/13/2019: Hemoglobin 11.6; Magnesium 1.6; Platelets 258 04/29/2019: ALT 19; BUN 17; Creatinine, Ser 0.65; Potassium 4.6; Sodium 134  Recent Lipid Panel    Component Value Date/Time   CHOL 160 03/11/2019 0101   TRIG 111 03/11/2019 0101   HDL 43 03/11/2019 0101   CHOLHDL 3.7 03/11/2019 0101   VLDL 22 03/11/2019 0101   LDLCALC 95 03/11/2019 0101    Physical Exam:    VS:  BP 138/70   Pulse 96   Ht 5\' 4"  (1.626 m)   Wt 202 lb (91.6 kg)   SpO2 96%   BMI 34.67 kg/m     Wt Readings from Last 3 Encounters:  01/06/20 202 lb (91.6 kg)  09/22/19 201 lb 6.4 oz (91.4 kg)  06/12/19 190 lb 3.2 oz (86.3 kg)     GEN:  Well nourished, well developed in no acute distress HEENT: Normal NECK: No JVD; No carotid bruits LYMPHATICS: No lymphadenopathy CARDIAC: RRR, no murmurs, no rubs, no gallops RESPIRATORY:  Clear to auscultation without rales, wheezing or rhonchi  ABDOMEN: Soft, non-tender, non-distended MUSCULOSKELETAL:  No edema; No deformity  SKIN: Warm and dry LOWER EXTREMITIES: no swelling NEUROLOGIC:  Alert and oriented x 3 PSYCHIATRIC:  Normal affect   ASSESSMENT:    1. Dyslipidemia   2. Ischemic  cardiomyopathy   3. ICD (implantable cardioverter-defibrillator) in place   4. Essential hypertension   5. Ventricular tachycardia (HCC)    PLAN:    In order of problems listed above:  1. Dyslipidemia.  I will ask her to have cholesterol checked today.  She told me that she is an old partially have her cholesterol in the total is less than 200 she does not want to take medications.  I spent a great deal of time explaining to her what the  rationale for taking some medication for it.  I also told her that since she is having difficulty tolerating statin will probably consider PCSK9 agent.  I will check cholesterol and then will decide. 2. Ischemic cardiomyopathy.  I will ask him to have echocardiogram done to recheck left ventricle ejection fraction. 3. Essential hypertension blood pressure on the higher side but still within normal limits.  We will continue present medications for now. 4. History of ventricular tachycardia, not recorded by ICD.  Continue monitoring. 5. ICD present.  Interrogation reviewed.  Close to 7 years left on the device.  No recent recording of ventricle tach arrhythmias.   Medication Adjustments/Labs and Tests Ordered: Current medicines are reviewed at length with the patient today.  Concerns regarding medicines are outlined above.  No orders of the defined types were placed in this encounter.  Medication changes: No orders of the defined types were placed in this encounter.   Signed, Georgeanna Lea, MD, [facility] 01/06/2020 3:02 PM    [facility]

## 2020-01-06 NOTE — Patient Instructions (Addendum)
Medication Instructions:  No medication changes *If you need a refill on your cardiac medications before your next appointment, please call your pharmacy*   Lab Work: Your physician recommends that you have your cholesterol /lipids checked today.  If you have labs (blood work) drawn today and your tests are completely normal, you will receive your results only by: Marland Kitchen MyChart Message (if you have MyChart) OR . A paper copy in the mail If you have any lab test that is abnormal or we need to change your treatment, we will call you to review the results.   Testing/Procedures: Your physician has requested that you have an echocardiogram. Echocardiography is a painless test that uses sound waves to create images of your heart. It provides your doctor with information about the size and shape of your heart and how well your heart's chambers and valves are working. This procedure takes approximately one hour. There are no restrictions for this procedure.     Follow-Up: At [facility], you and your health needs are our priority.  As part of our continuing mission to provide you with exceptional heart care, we have created designated Provider Care Teams.  These Care Teams include your primary Cardiologist (physician) and Advanced Practice Providers (APPs -  Physician Assistants and Nurse Practitioners) who all work together to provide you with the care you need, when you need it.  We recommend signing up for the patient portal called "MyChart".  Sign up information is provided on this After Visit Summary.  MyChart is used to connect with patients for Virtual Visits (Telemedicine).  Patients are able to view lab/test results, encounter notes, upcoming appointments, etc.  Non-urgent messages can be sent to your provider as well.   To learn more about what you can do with MyChart, go to [email or web address].    Your next appointment:   3 month(s)  The format for your next appointment:   In  Person  Provider:   Gypsy Balsam, MD   Other Instructions  Echocardiogram An echocardiogram is a procedure that uses painless sound waves (ultrasound) to produce an image of the heart. Images from an echocardiogram can provide important information about:  Signs of coronary artery disease (CAD).  Aneurysm detection. An aneurysm is a weak or damaged part of an artery wall that bulges out from the normal force of blood pumping through the body.  Heart size and shape. Changes in the size or shape of the heart can be associated with certain conditions, including heart failure, aneurysm, and CAD.  Heart muscle function.  Heart valve function.  Signs of a past heart attack.  Fluid buildup around the heart.  Thickening of the heart muscle.  A tumor or infectious growth around the heart valves. Tell a health care provider about:  Any allergies you have.  All medicines you are taking, including vitamins, herbs, eye drops, creams, and over-the-counter medicines.  Any blood disorders you have.  Any surgeries you have had.  Any medical conditions you have.  Whether you are pregnant or may be pregnant. What are the risks? Generally, this is a safe procedure. However, problems may occur, including:  Allergic reaction to dye (contrast) that may be used during the procedure. What happens before the procedure? No specific preparation is needed. You may eat and drink normally. What happens during the procedure?   An IV tube may be inserted into one of your veins.  You may receive contrast through this tube. A contrast is an injection that improves  the quality of the pictures from your heart.  A gel will be applied to your chest.  A wand-like tool (transducer) will be moved over your chest. The gel will help to transmit the sound waves from the transducer.  The sound waves will harmlessly bounce off of your heart to allow the heart images to be captured in real-time motion.  The images will be recorded on a computer. The procedure may vary among health care providers and hospitals. What happens after the procedure?  You may return to your normal, everyday life, including diet, activities, and medicines, unless your health care provider tells you not to do that. Summary  An echocardiogram is a procedure that uses painless sound waves (ultrasound) to produce an image of the heart.  Images from an echocardiogram can provide important information about the size and shape of your heart, heart muscle function, heart valve function, and fluid buildup around your heart.  You do not need to do anything to prepare before this procedure. You may eat and drink normally.  After the echocardiogram is completed, you may return to your normal, everyday life, unless your health care provider tells you not to do that. This information is not intended to replace advice given to you by your health care provider. Make sure you discuss any questions you have with your health care provider. Document Revised: 01/16/2019 Document Reviewed: 10/28/2016 Elsevier Patient Education  [place].

## 2020-01-07 LAB — LIPID PANEL
Chol/HDL Ratio: 3.4 ratio (ref 0.0–4.4)
Cholesterol, Total: 170 mg/dL (ref 100–199)
HDL: 50 mg/dL (ref >39)
LDL Chol Calc (NIH): 91 mg/dL (ref 0–99)
Triglycerides: 169 mg/dL — ABNORMAL HIGH (ref 0–149)
VLDL Cholesterol Cal: 29 mg/dL (ref 5–40)

## 2020-01-09 ENCOUNTER — Telehealth: Payer: Self-pay | Admitting: Emergency Medicine

## 2020-01-09 NOTE — Telephone Encounter (Signed)
Left message for patient to return call regarding test results.  

## 2020-01-13 ENCOUNTER — Other Ambulatory Visit: Payer: Self-pay | Admitting: Cardiology

## 2020-02-02 ENCOUNTER — Ambulatory Visit (INDEPENDENT_AMBULATORY_CARE_PROVIDER_SITE_OTHER): Payer: BC Managed Care – PPO

## 2020-02-02 ENCOUNTER — Other Ambulatory Visit: Payer: Self-pay

## 2020-02-02 DIAGNOSIS — I255 Ischemic cardiomyopathy: Secondary | ICD-10-CM

## 2020-02-02 NOTE — Progress Notes (Signed)
Complete echocardiogram has been performed.  Jimmy Con Arganbright RDCS, RVT 

## 2020-02-03 ENCOUNTER — Encounter: Payer: Self-pay | Admitting: Family Medicine

## 2020-02-03 NOTE — Progress Notes (Signed)
Subjective:  Patient ID: Melinda Nguyen, female    DOB: April 06, 1961  Age: 59 y.o. MRN: [id number]  Chief Complaint  Patient presents with  . Diabetes  . Hyperlipidemia  . Hypertension    HPI Patient to be evaluated for type 2 diabetes mellitus with hyperglycemia.   Primary symptoms reported include fatigue.  She specifically denies blurred vision, peripheral neuropathy, polydipsia, polyphagia or polyuria.  Current meds include an oral hypoglycemic ( Glucophage and lantus 20 U daily ).  She reports home blood glucose readings have averaged fasting readings in the 90-110 mg/dL range. She checks her glucose 1 times per day.  In regard to preventative care, she performs foot self-exams daily and her last ophthalmology exam was in 2019.  Patient is eating fairly healthy and exercising.     Pt presents with hyperlipidemia.  Current treatment includes LIpitor 80 mg once weekly and a low cholesterol/low fat diet.  Compliance with treatment has been good; she maintains her low cholesterol diet, follows up as directed, and maintains her exercise regimen.      Atherosclerotic heart disease of native coronary artery with other forms of angina pectoris details; she is here today for routine follow-up.  Lucendia has a prior history of a myocardial infarction. Her heart disease was first diagnosed 03/2019.  The course of the disease has been stable.  Currently, her treatment regimen consists of Coreg, a diuretic ( furosemide ) prn,  Lipitor 80 mg once a week, losartan 25 mg once daily, aspirin 81 mg once dailly, and a nitrate ( p.r.n. sublingual nitroglycerin ).  No associated symptoms are reported.  A cardiac cath has been done ( performed 03/2019; official report shows left circumflex. appears to have had a plaque which has resolved. ).  Recent echo normal.     Past Medical History:  Diagnosis Date  . AICD (automatic cardioverter/defibrillator) present 03/13/2019  . CAD (coronary artery disease)   . CHF  (congestive heart failure) ([place])   . Depression   . Diabetes mellitus without complication ([place])   . GERD (gastroesophageal reflux disease)   . Hypertension   . Mixed hyperlipidemia   . Other hypotension   . Smoking history 03/10/2019   Smokes 1/2 pack a day  . Ventricular tachycardia [facility])    Past Surgical History:  Procedure Laterality Date  . ABDOMINAL HYSTERECTOMY    . ICD IMPLANT N/A 03/13/2019   Procedure: ICD IMPLANT;  Surgeon: Constance Haw, MD;  Location: [place] [facility];  Service: Cardiovascular;  Laterality: N/A;  . ICD IMPLANT  03/13/2019  . LEFT HEART CATH AND CORONARY ANGIOGRAPHY N/A 03/12/2019   Procedure: LEFT HEART CATH AND CORONARY ANGIOGRAPHY;  Surgeon: Wellington Hampshire, MD;  Location: [place] [facility];  Service: Cardiovascular;  Laterality: N/A;    Family History  Problem Relation Age of Onset  . Hypertension Mother   . Diabetes Mother   . COPD Father    Social History   Socioeconomic History  . Marital status: Single    Spouse name: Not on file  . Number of children: Not on file  . Years of education: Not on file  . Highest education level: Not on file  Occupational History  . Not on file  Tobacco Use  . Smoking status: Former Smoker    Packs/day: 1.00    Years: 30.00    Pack years: 30.00    Types: Cigarettes  . Smokeless tobacco: Never Used  Substance and Sexual Activity  . Alcohol  use: Not Currently  . Drug use: Never  . Sexual activity: Not on file  Other Topics Concern  . Not on file  Social History Narrative  . Not on file   Social Determinants of Health   Financial Resource Strain:   . Difficulty of Paying Living Expenses:   Food Insecurity:   . Worried About Programme researcher, broadcasting/film/video in the Last Year:   . Barista in the Last Year:   Transportation Needs:   . Freight forwarder (Medical):   Marland Kitchen Lack of Transportation (Non-Medical):   Physical Activity:   . Days of Exercise per Week:   . Minutes of Exercise per  Session:   Stress:   . Feeling of Stress :   Social Connections:   . Frequency of Communication with Friends and Family:   . Frequency of Social Gatherings with Friends and Family:   . Attends Religious Services:   . Active Member of Clubs or Organizations:   . Attends Banker Meetings:   Marland Kitchen Marital Status:     Review of Systems  Constitutional: Negative for chills, fatigue and fever.  HENT: Negative for congestion, ear pain, rhinorrhea and sore throat.   Respiratory: Negative for cough and shortness of breath.   Cardiovascular: Negative for chest pain.  Gastrointestinal: Negative for abdominal pain, constipation, diarrhea, nausea and vomiting.  Endocrine: Negative for polydipsia, polyphagia and polyuria.  Genitourinary: Negative for dysuria and urgency.  Musculoskeletal: Negative for back pain and myalgias.  Neurological: Positive for numbness. Negative for dizziness, weakness, light-headedness and headaches.  Psychiatric/Behavioral: Negative for dysphoric mood and sleep disturbance (melatonin 3 mg at night.). The patient is not nervous/anxious.      Objective:  BP 124/70   Pulse 88   Temp 98.2 F (36.8 C)   Resp 16   Ht 5\' 4"  (1.626 m)   Wt 197 lb 6.4 oz (89.5 kg)   BMI 33.88 kg/m   BP/Weight 02/05/2020 01/06/2020 09/22/2019  Systolic BP 124 138 140  Diastolic BP 70 70 70  Wt. (Lbs) 197.4 202 201.4  BMI 33.88 34.67 34.57    Physical Exam Vitals reviewed.  Constitutional:      Appearance: Normal appearance. She is normal weight.  HENT:     Right Ear: Tympanic membrane, ear canal and external ear normal.     Left Ear: Tympanic membrane and external ear normal.     Nose: Congestion and rhinorrhea present.     Comments: MAXILLARY SINUS TENDERNESS BL    Mouth/Throat:     Pharynx: Oropharynx is clear.  Neck:     Vascular: No carotid bruit.  Cardiovascular:     Rate and Rhythm: Normal rate and regular rhythm.     Pulses: Normal pulses.     Heart  sounds: Normal heart sounds. No murmur.  Pulmonary:     Effort: Pulmonary effort is normal. No respiratory distress.     Breath sounds: Normal breath sounds.  Abdominal:     General: Abdomen is flat. Bowel sounds are normal.     Palpations: Abdomen is soft.     Tenderness: There is no abdominal tenderness.  Neurological:     Mental Status: She is alert and oriented to person, place, and time.  Psychiatric:        Mood and Affect: Mood normal.        Behavior: Behavior normal.     Lab Results  Component Value Date   WBC 8.7 02/05/2020  HGB 14.0 02/05/2020   HCT 43.2 02/05/2020   PLT 284 02/05/2020   GLUCOSE 98 02/05/2020   CHOL 170 01/06/2020   TRIG 169 (H) 01/06/2020   HDL 50 01/06/2020   LDLCALC 91 01/06/2020   ALT 11 02/05/2020   AST 13 02/05/2020   NA 139 02/05/2020   K 4.4 02/05/2020   CL 102 02/05/2020   CREATININE 0.56 (L) 02/05/2020   BUN 9 02/05/2020   CO2 23 02/05/2020   HGBA1C 7.6 (H) 02/05/2020   MICROALBUR 150 02/05/2020   Diabetic Foot Exam - Simple   Simple Foot Form Diabetic Foot exam was performed with the following findings: Yes 02/03/2020  2:35 PM  Visual Inspection No deformities, no ulcerations, no other skin breakdown bilaterally: Yes Sensation Testing Intact to touch and monofilament testing bilaterally: Yes Pulse Check Posterior Tibialis and Dorsalis pulse intact bilaterally: Yes Comments      Assessment & Plan:  1. Mixed hyperlipidemia Well controlled.  No changes to medicines.  Continue to work on eating a healthy diet and exercise.  Labs drawn today.   2. Hypertensive heart disease with chronic combined systolic and diastolic congestive heart failure (HCC) Fairly controlled. Heart failure compensated.  Consider taking lipitor 80 mg 1/2 twice a week  Continue to work on eating a healthy diet and exercise.  Labs drawn today.  - Comprehensive metabolic panel  3. Diabetic glomerulopathy (HCC) Control: Improved. Recommend check  sugars fasting daily. Recommend check feet daily. Recommend annual eye exams. Medicines: Recommend increase losartan to 50 mg once daily in am. Recommend start Jardiance or farxiga while continuing metformin and Insulin. Continue to work on eating a healthy diet and exercise.  Labs drawn today.   - POCT UA - Microalbumin - Hemoglobin A1c - CBC with Differential/Platelet  4. Visit for screening mammogram - MM Digital Screening; Future  5. Acute non-recurrent sinusitis of other sinus - azithromycin (ZITHROMAX) 250 MG tablet; 2 pills on the first day, one pill for the next four days.  Dispense: 6 each; Refill: 0  6. Class 1 obesity due to excess calories with serious comorbidity and body mass index (BMI) of 33.0 to 33.9 in adult Recommend continue to work on eating healthy diet and exercise. 7. Old myocardial infarction Continue to follow up with cardiology regularly.  9. ICD (implantable cardioverter-defibrillator) in place Continue to follow up with cardiology regularly.  Meds ordered this encounter  Medications  . azithromycin (ZITHROMAX) 250 MG tablet    Sig: 2 pills on the first day, one pill for the next four days.    Dispense:  6 each    Refill:  0    Orders Placed This Encounter  Procedures  . MM Digital Screening  . Hemoglobin A1c  . Comprehensive metabolic panel  . CBC with Differential/Platelet  . POCT UA - Microalbumin     Follow-up: Return in about 2 months (around 04/06/2020) for physical exam and 3 months for chronic visit. Marland Kitchen  An After Visit Summary was printed and given to the patient.  Blane Ohara [facility] [phone]

## 2020-02-04 ENCOUNTER — Telehealth: Payer: Self-pay

## 2020-02-04 NOTE — Telephone Encounter (Signed)
Spoke with patient regarding results.  Patient verbalizes understanding and is agreeable to plan of care. Advised patient to call back with any issues or concerns.  

## 2020-02-04 NOTE — Telephone Encounter (Signed)
Left message on patients voicemail to please return our call.   

## 2020-02-04 NOTE — Telephone Encounter (Signed)
-----   Message from Georgeanna Lea, MD sent at 02/04/2020  3:35 PM EDT ----- Echocardiogram showed low normal ejection fraction, moderate concentric left ventricle hypertrophy, everything else looks good

## 2020-02-04 NOTE — Telephone Encounter (Signed)
Follow up   Pt is returning call    

## 2020-02-05 ENCOUNTER — Other Ambulatory Visit: Payer: Self-pay

## 2020-02-05 ENCOUNTER — Encounter: Payer: Self-pay | Admitting: Family Medicine

## 2020-02-05 ENCOUNTER — Ambulatory Visit (INDEPENDENT_AMBULATORY_CARE_PROVIDER_SITE_OTHER): Payer: BC Managed Care – PPO | Admitting: Family Medicine

## 2020-02-05 VITALS — BP 124/70 | HR 88 | Temp 98.2°F | Resp 16 | Ht 64.0 in | Wt 197.4 lb

## 2020-02-05 DIAGNOSIS — Z1231 Encounter for screening mammogram for malignant neoplasm of breast: Secondary | ICD-10-CM | POA: Diagnosis not present

## 2020-02-05 DIAGNOSIS — E782 Mixed hyperlipidemia: Secondary | ICD-10-CM | POA: Diagnosis not present

## 2020-02-05 DIAGNOSIS — E6609 Other obesity due to excess calories: Secondary | ICD-10-CM

## 2020-02-05 DIAGNOSIS — I5042 Chronic combined systolic (congestive) and diastolic (congestive) heart failure: Secondary | ICD-10-CM

## 2020-02-05 DIAGNOSIS — Z6833 Body mass index (BMI) 33.0-33.9, adult: Secondary | ICD-10-CM

## 2020-02-05 DIAGNOSIS — E1121 Type 2 diabetes mellitus with diabetic nephropathy: Secondary | ICD-10-CM

## 2020-02-05 DIAGNOSIS — I110 Hypertensive heart disease with heart failure: Secondary | ICD-10-CM | POA: Diagnosis not present

## 2020-02-05 DIAGNOSIS — Z95810 Presence of automatic (implantable) cardiac defibrillator: Secondary | ICD-10-CM

## 2020-02-05 DIAGNOSIS — I10 Essential (primary) hypertension: Secondary | ICD-10-CM | POA: Diagnosis not present

## 2020-02-05 DIAGNOSIS — E1165 Type 2 diabetes mellitus with hyperglycemia: Secondary | ICD-10-CM | POA: Diagnosis not present

## 2020-02-05 DIAGNOSIS — J018 Other acute sinusitis: Secondary | ICD-10-CM

## 2020-02-05 DIAGNOSIS — I252 Old myocardial infarction: Secondary | ICD-10-CM

## 2020-02-05 LAB — POCT UA - MICROALBUMIN: Microalbumin Ur, POC: 150 mg/L

## 2020-02-05 MED ORDER — AZITHROMYCIN 250 MG PO TABS: ORAL_TABLET | ORAL | 0 refills | Status: DC

## 2020-02-05 NOTE — Patient Instructions (Signed)
Heart disease/high cholesterol-recommend take Lipitor 80 mg half pill twice a week. For Lasix recommend 40 mg 1/2 pill daily as needed for swelling.  May take either a whole or half a pill of potassium when you take your Lasix. Continue working on eating healthy and exercise. Scheduling mammogram.   Follow-up for physical this summer.

## 2020-02-06 LAB — CBC WITH DIFFERENTIAL/PLATELET
Basophils Absolute: 0.1 10*3/uL (ref 0.0–0.2)
Basos: 1 %
EOS (ABSOLUTE): 0.3 10*3/uL (ref 0.0–0.4)
Eos: 4 %
Hematocrit: 43.2 % (ref 34.0–46.6)
Hemoglobin: 14 g/dL (ref 11.1–15.9)
Immature Grans (Abs): 0 10*3/uL (ref 0.0–0.1)
Immature Granulocytes: 0 %
Lymphocytes Absolute: 2.4 10*3/uL (ref 0.7–3.1)
Lymphs: 28 %
MCH: 27.3 pg (ref 26.6–33.0)
MCHC: 32.4 g/dL (ref 31.5–35.7)
MCV: 84 fL (ref 79–97)
Monocytes Absolute: 0.5 10*3/uL (ref 0.1–0.9)
Monocytes: 6 %
Neutrophils Absolute: 5.3 10*3/uL (ref 1.4–7.0)
Neutrophils: 61 %
Platelets: 284 10*3/uL (ref 150–450)
RBC: 5.12 x10E6/uL (ref 3.77–5.28)
RDW: 12.9 % (ref 11.7–15.4)
WBC: 8.7 10*3/uL (ref 3.4–10.8)

## 2020-02-06 LAB — HEMOGLOBIN A1C
Est. average glucose Bld gHb Est-mCnc: 171 mg/dL
Hgb A1c MFr Bld: 7.6 % — ABNORMAL HIGH (ref 4.8–5.6)

## 2020-02-06 LAB — COMPREHENSIVE METABOLIC PANEL
ALT: 11 IU/L (ref 0–32)
AST: 13 IU/L (ref 0–40)
Albumin/Globulin Ratio: 1.5 (ref 1.2–2.2)
Albumin: 4 g/dL (ref 3.8–4.9)
Alkaline Phosphatase: 93 IU/L (ref 39–117)
BUN/Creatinine Ratio: 16 (ref 12–28)
BUN: 9 mg/dL (ref 8–27)
Bilirubin Total: 0.4 mg/dL (ref 0.0–1.2)
CO2: 23 mmol/L (ref 20–29)
Calcium: 9.2 mg/dL (ref 8.7–10.3)
Chloride: 102 mmol/L (ref 96–106)
Creatinine, Ser: 0.56 mg/dL — ABNORMAL LOW (ref 0.57–1.00)
GFR calc Af Amer: 117 mL/min/{1.73_m2} (ref >59)
GFR calc non Af Amer: 102 mL/min/{1.73_m2} (ref >59)
Globulin, Total: 2.7 g/dL (ref 1.5–4.5)
Glucose: 98 mg/dL (ref 65–99)
Potassium: 4.4 mmol/L (ref 3.5–5.2)
Sodium: 139 mmol/L (ref 134–144)
Total Protein: 6.7 g/dL (ref 6.0–8.5)

## 2020-02-08 DIAGNOSIS — Z1231 Encounter for screening mammogram for malignant neoplasm of breast: Secondary | ICD-10-CM | POA: Insufficient documentation

## 2020-02-08 DIAGNOSIS — Z6833 Body mass index (BMI) 33.0-33.9, adult: Secondary | ICD-10-CM | POA: Insufficient documentation

## 2020-02-08 DIAGNOSIS — J019 Acute sinusitis, unspecified: Secondary | ICD-10-CM | POA: Insufficient documentation

## 2020-02-08 DIAGNOSIS — E6609 Other obesity due to excess calories: Secondary | ICD-10-CM | POA: Insufficient documentation

## 2020-02-08 DIAGNOSIS — I5041 Acute combined systolic (congestive) and diastolic (congestive) heart failure: Secondary | ICD-10-CM | POA: Insufficient documentation

## 2020-02-08 NOTE — Assessment & Plan Note (Deleted)
RESOLVED. 

## 2020-02-10 ENCOUNTER — Telehealth: Payer: Self-pay

## 2020-02-10 NOTE — Telephone Encounter (Signed)
Melinda Nguyen did not want to increase the Losartan to 50 mg because she is concerned that her bp will drop and she does not want to begin a third medication for her diabetes.  She is willing to increase her insulin if you feel that this would be beneficial.

## 2020-02-11 NOTE — Telephone Encounter (Signed)
Please reiterate that these medicines will help with her kidney function and her heart function, in addition to helping with her sugars. If she would like to start one at a time and start the second one in 2 weeks to be sure she is tolerating.  Recommend increase losartan to 50 mg once daily in am. Recommend start Jardiance or farxiga while continuing metformin and Insulin. If she still does not agree, I would recommend increase her insulin by 4 U daily. Kc

## 2020-02-12 NOTE — Telephone Encounter (Signed)
Left message with concerns.  Requested to call back.

## 2020-03-12 ENCOUNTER — Ambulatory Visit (INDEPENDENT_AMBULATORY_CARE_PROVIDER_SITE_OTHER): Payer: BC Managed Care – PPO | Admitting: *Deleted

## 2020-03-12 DIAGNOSIS — I255 Ischemic cardiomyopathy: Secondary | ICD-10-CM | POA: Diagnosis not present

## 2020-03-12 LAB — CUP PACEART REMOTE DEVICE CHECK
Battery Remaining Longevity: 83 mo
Battery Remaining Percentage: 88 %
Battery Voltage: 3.05 V
Brady Statistic RV Percent Paced: 1 %
Date Time Interrogation Session: 20210604020022
HighPow Impedance: 75 Ohm
HighPow Impedance: 75 Ohm
Implantable Lead Implant Date: 20200604
Implantable Lead Location: 753860
Implantable Pulse Generator Implant Date: 20200604
Lead Channel Impedance Value: 480 Ohm
Lead Channel Pacing Threshold Amplitude: 1 V
Lead Channel Pacing Threshold Pulse Width: 0.5 ms
Lead Channel Sensing Intrinsic Amplitude: 6.8 mV
Lead Channel Setting Pacing Amplitude: 3.5 V
Lead Channel Setting Pacing Pulse Width: 0.5 ms
Lead Channel Setting Sensing Sensitivity: 0.5 mV
Pulse Gen Serial Number: 9860889

## 2020-03-16 NOTE — Progress Notes (Signed)
Remote ICD transmission.   

## 2020-04-01 ENCOUNTER — Telehealth (INDEPENDENT_AMBULATORY_CARE_PROVIDER_SITE_OTHER): Payer: BC Managed Care – PPO | Admitting: Physician Assistant

## 2020-04-01 ENCOUNTER — Encounter: Payer: Self-pay | Admitting: Physician Assistant

## 2020-04-01 VITALS — Temp 99.0°F | Ht 64.0 in | Wt 192.0 lb

## 2020-04-01 DIAGNOSIS — J060 Acute laryngopharyngitis: Secondary | ICD-10-CM | POA: Diagnosis not present

## 2020-04-01 MED ORDER — BENZONATATE 100 MG PO CAPS
100.0000 mg | ORAL_CAPSULE | Freq: Two times a day (BID) | ORAL | 0 refills | Status: DC | PRN
Start: 2020-04-01 — End: 2020-05-10

## 2020-04-01 MED ORDER — AZITHROMYCIN 250 MG PO TABS: ORAL_TABLET | ORAL | 0 refills | Status: DC

## 2020-04-01 NOTE — Assessment & Plan Note (Signed)
rx for zpack and tessalon Follow up if any symptoms change or worsen

## 2020-04-01 NOTE — Progress Notes (Signed)
Virtual Visit via Telephone Note   This visit type was conducted due to national recommendations for restrictions regarding the COVID-19 Pandemic (e.g. social distancing) in an effort to limit this patient's exposure and mitigate transmission in our community.  Due to her co-morbid illnesses, this patient is at least at moderate risk for complications without adequate follow up.  This format is felt to be most appropriate for this patient at this time.  The patient did not have access to video technology/had technical difficulties with video requiring transitioning to audio format only (telephone).  All issues noted in this document were discussed and addressed.  No physical exam could be performed with this format.  Patient verbally consented to a telehealth visit.   Date:  04/01/2020   ID:  [name], DOB Mar 30, 1961, MRN [id number]  Patient Location: Home Provider Location: Office  PCP:  Blane Ohara, MD    Chief Complaint:  URI  History of Present Illness:    Kimanh Templeman is a 59 y.o. female with URI - pt states for the past several weeks she has had sinus pressure, pnd and now has developed productive cough - she had a low grade fever over a week ago but that resolved - she has not had COVID exposure and no flu like symptoms Pt has had a mild sore throat Denies chest pain, sob, wheezing  The patient does not have symptoms concerning for COVID-19 infection (fever, chills, cough, or new shortness of breath).    Past Medical History:  Diagnosis Date   AICD (automatic cardioverter/defibrillator) present 03/13/2019   CAD (coronary artery disease)    CHF (congestive heart failure) (HCC)    Depression    Diabetes mellitus without complication (HCC)    GERD (gastroesophageal reflux disease)    Hypertension    Mixed hyperlipidemia    Other hypotension    Smoking history 03/10/2019   Smokes 1/2 pack a day   Ventricular tachycardia (HCC)    Past Surgical History:    Procedure Laterality Date   ABDOMINAL HYSTERECTOMY     ICD IMPLANT N/A 03/13/2019   Procedure: ICD IMPLANT;  Surgeon: Regan Lemming, MD;  Location: [facility];  Service: Cardiovascular;  Laterality: N/A;   ICD IMPLANT  03/13/2019   LEFT HEART CATH AND CORONARY ANGIOGRAPHY N/A 03/12/2019   Procedure: LEFT HEART CATH AND CORONARY ANGIOGRAPHY;  Surgeon: Iran Ouch, MD;  Location: [facility];  Service: Cardiovascular;  Laterality: N/A;     Current Meds  Medication Sig   Ascorbic Acid (VITAMIN C) 1000 MG tablet Take 1,000 mg by mouth 2 (two) times a day.   aspirin EC 81 MG tablet Take 1 tablet (81 mg total) by mouth daily.   carvedilol (COREG) 3.125 MG tablet Take 1 tablet (3.125 mg total) by mouth 2 (two) times daily with a meal.   DULoxetine (CYMBALTA) 30 MG capsule Take 1 capsule (30 mg total) by mouth at bedtime.   furosemide (LASIX) 40 MG tablet Take 40 mg by mouth daily.   LANTUS SOLOSTAR 100 UNIT/ML Solostar Pen Inject 20 Units into the skin at bedtime.   LORazepam (ATIVAN) 0.5 MG tablet Take 0.5 mg by mouth 2 (two) times daily as needed for anxiety.   losartan (COZAAR) 25 MG tablet Take 1 tablet (25 mg total) by mouth daily.   metFORMIN (GLUCOPHAGE) 1000 MG tablet Take 1,000 mg by mouth 2 (two) times a day.   nitroGLYCERIN (NITROSTAT) 0.4 MG SL tablet Place 1  tablet (0.4 mg total) under the tongue every 5 (five) minutes as needed.   omeprazole (PRILOSEC) 20 MG capsule Take 20 mg by mouth daily as needed (acid reflux).     Allergies:   Shrimp [shellfish allergy], Hydrocodone, Codeine, Flagyl [metronidazole], Glimepiride, and Victoza [liraglutide]   Social History   Tobacco Use   Smoking status: Former Smoker    Packs/day: 1.00    Years: 30.00    Pack years: 30.00    Types: Cigarettes   Smokeless tobacco: Never Used  Scientific laboratory technician Use: Never used  Substance Use Topics   Alcohol use: Not Currently   Drug use: Never      Family Hx: The patient's family history includes COPD in her father; Diabetes in her mother; Hypertension in her mother.  ROS:   Please see the history of present illness.    All other systems reviewed and are negative.  Labs/Other Tests and Data Reviewed:    Recent Labs: 02/05/2020: ALT 11; BUN 9; Creatinine, Ser 0.56; Hemoglobin 14.0; Platelets 284; Potassium 4.4; Sodium 139   Recent Lipid Panel Lab Results  Component Value Date/Time   CHOL 170 01/06/2020 03:10 PM   TRIG 169 (H) 01/06/2020 03:10 PM   HDL 50 01/06/2020 03:10 PM   CHOLHDL 3.4 01/06/2020 03:10 PM   CHOLHDL 3.7 03/11/2019 01:01 AM   LDLCALC 91 01/06/2020 03:10 PM    Wt Readings from Last 3 Encounters:  04/01/20 192 lb (87.1 kg)  02/05/20 197 lb 6.4 oz (89.5 kg)  01/06/20 202 lb (91.6 kg)     Objective:    Vital Signs:  Temp 99 F (37.2 C)    Ht 5\' 4"  (1.626 m)    Wt 192 lb (87.1 kg)    BMI 32.96 kg/m    VITAL SIGNS:  reviewed  ASSESSMENT & PLAN:    1. URI - rx for zpack and tessalon as directed - continue mucinex - follow up if any symptoms change or worsen  COVID-19 Education: The signs and symptoms of COVID-19 were discussed with the patient and how to seek care for testing (follow up with PCP or arrange E-visit). The importance of social distancing was discussed today.  Time:   Today, I have spent 10 minutes with the patient with telehealth technology discussing the above problems.     Medication Adjustments/Labs and Tests Ordered: Current medicines are reviewed at length with the patient today.  Concerns regarding medicines are outlined above.   Tests Ordered: No orders of the defined types were placed in this encounter.   Medication Changes: Meds ordered this encounter  Medications   azithromycin (ZITHROMAX) 250 MG tablet    Sig: 2 po day one then 1 po days 2-5    Dispense:  6 each    Refill:  0    Order Specific Question:   Supervising Provider    Answer:   Shelton Silvas    benzonatate (TESSALON) 100 MG capsule    Sig: Take 1 capsule (100 mg total) by mouth 2 (two) times daily as needed for cough.    Dispense:  30 capsule    Refill:  0    Order Specific Question:   Supervising Provider    AnswerShelton Silvas    Follow Up:  In Person prn  Signed, Webb Silversmith, PA-C  04/01/2020 11:12 AM    [facility]

## 2020-04-04 ENCOUNTER — Other Ambulatory Visit: Payer: Self-pay | Admitting: Family Medicine

## 2020-04-06 ENCOUNTER — Encounter: Payer: Self-pay | Admitting: Cardiology

## 2020-04-06 ENCOUNTER — Other Ambulatory Visit: Payer: Self-pay

## 2020-04-06 ENCOUNTER — Ambulatory Visit: Payer: BC Managed Care – PPO | Admitting: Cardiology

## 2020-04-06 VITALS — BP 150/70 | HR 93 | Ht 64.0 in | Wt 194.2 lb

## 2020-04-06 DIAGNOSIS — I10 Essential (primary) hypertension: Secondary | ICD-10-CM | POA: Diagnosis not present

## 2020-04-06 DIAGNOSIS — I472 Ventricular tachycardia, unspecified: Secondary | ICD-10-CM

## 2020-04-06 DIAGNOSIS — I2510 Atherosclerotic heart disease of native coronary artery without angina pectoris: Secondary | ICD-10-CM | POA: Diagnosis not present

## 2020-04-06 DIAGNOSIS — I252 Old myocardial infarction: Secondary | ICD-10-CM | POA: Diagnosis not present

## 2020-04-06 DIAGNOSIS — I255 Ischemic cardiomyopathy: Secondary | ICD-10-CM | POA: Diagnosis not present

## 2020-04-06 DIAGNOSIS — Z95810 Presence of automatic (implantable) cardiac defibrillator: Secondary | ICD-10-CM

## 2020-04-06 DIAGNOSIS — E785 Hyperlipidemia, unspecified: Secondary | ICD-10-CM

## 2020-04-06 MED ORDER — NITROGLYCERIN 0.4 MG SL SUBL: 0.4000 mg | SUBLINGUAL_TABLET | SUBLINGUAL | 12 refills | Status: DC | PRN

## 2020-04-06 MED ORDER — EZETIMIBE 10 MG PO TABS
10.0000 mg | ORAL_TABLET | Freq: Every day | ORAL | 1 refills | Status: DC
Start: 2020-04-06 — End: 2020-05-10

## 2020-04-06 NOTE — Patient Instructions (Signed)
Medication Instructions:  Your physician has recommended you make the following change in your medication:   INCREASE: Carvedilol to twice daily   START: zetia 10 mg daily   *If you need a refill on your cardiac medications before your next appointment, please call your pharmacy*   Lab Work: None.  If you have labs (blood work) drawn today and your tests are completely normal, you will receive your results only by: Marland Kitchen MyChart Message (if you have MyChart) OR . A paper copy in the mail If you have any lab test that is abnormal or we need to change your treatment, we will call you to review the results.   Testing/Procedures: None.    Follow-Up: At [facility], you and your health needs are our priority.  As part of our continuing mission to provide you with exceptional heart care, we have created designated Provider Care Teams.  These Care Teams include your primary Cardiologist (physician) and Advanced Practice Providers (APPs -  Physician Assistants and Nurse Practitioners) who all work together to provide you with the care you need, when you need it.  We recommend signing up for the patient portal called "MyChart".  Sign up information is provided on this After Visit Summary.  MyChart is used to connect with patients for Virtual Visits (Telemedicine).  Patients are able to view lab/test results, encounter notes, upcoming appointments, etc.  Non-urgent messages can be sent to your provider as well.   To learn more about what you can do with MyChart, go to [email or web address].    Your next appointment:   5 month(s)  The format for your next appointment:   In Person  Provider:   Gypsy Balsam, MD   Other Instructions Ezetimibe Tablets What is this medicine? EZETIMIBE (ez ET i mibe) blocks the absorption of cholesterol from the stomach. It can help lower blood cholesterol for patients who are at risk of getting heart disease or a stroke. It is only for patients whose  cholesterol level is not controlled by diet. This medicine may be used for other purposes; ask your health care provider or pharmacist if you have questions. COMMON BRAND NAME(S): Zetia What should I tell my health care provider before I take this medicine? They need to know if you have any of these conditions:  liver disease  an unusual or allergic reaction to ezetimibe, medicines, foods, dyes, or preservatives  pregnant or trying to get pregnant  breast-feeding How should I use this medicine? Take this medicine by mouth with a glass of water. Follow the directions on the prescription label. This medicine can be taken with or without food. Take your doses at regular intervals. Do not take your medicine more often than directed. Talk to your pediatrician regarding the use of this medicine in children. Special care may be needed. Overdosage: If you think you have taken too much of this medicine contact a poison control center or emergency room at once. NOTE: This medicine is only for you. Do not share this medicine with others. What if I miss a dose? If you miss a dose, take it as soon as you can. If it is almost time for your next dose, take only that dose. Do not take double or extra doses. What may interact with this medicine? Do not take this medicine with any of the following medications:  fenofibrate  gemfibrozil This medicine may also interact with the following medications:  antacids  cyclosporine  herbal medicines like red yeast rice  other medicines to lower cholesterol or triglycerides This list may not describe all possible interactions. Give your health care provider a list of all the medicines, herbs, non-prescription drugs, or dietary supplements you use. Also tell them if you smoke, drink alcohol, or use illegal drugs. Some items may interact with your medicine. What should I watch for while using this medicine? Visit your doctor or health care professional for  regular checks on your progress. You will need to have your cholesterol levels checked. If you are also taking some other cholesterol medicines, you will also need to have tests to make sure your liver is working properly. Tell your doctor or health care professional if you get any unexplained muscle pain, tenderness, or weakness, especially if you also have a fever and tiredness. You need to follow a low-cholesterol, low-fat diet while you are taking this medicine. This will decrease your risk of getting heart and blood vessel disease. Exercising and avoiding alcohol and smoking can also help. Ask your doctor or dietician for advice. What side effects may I notice from receiving this medicine? Side effects that you should report to your doctor or health care professional as soon as possible:  allergic reactions like skin rash, itching or hives, swelling of the face, lips, or tongue  dark yellow or brown urine  unusually weak or tired  yellowing of the skin or eyes Side effects that usually do not require medical attention (report to your doctor or health care professional if they continue or are bothersome):  diarrhea  dizziness  headache  stomach upset or pain This list may not describe all possible side effects. Call your doctor for medical advice about side effects. You may report side effects to FDA at 1-800-FDA-1088. Where should I keep my medicine? Keep out of the reach of children. Store at room temperature between 15 and 30 degrees C (59 and 86 degrees F). Protect from moisture. Keep container tightly closed. Throw away any unused medicine after the expiration date. NOTE: This sheet is a summary. It may not cover all possible information. If you have questions about this medicine, talk to your doctor, pharmacist, or health care provider.  2020 Elsevier/Gold Standard (2012-04-01 15:39:09)

## 2020-04-06 NOTE — Progress Notes (Signed)
Cardiology Office Note:    Date:  04/06/2020   ID:  [name], DOB Jun 05, 1961, MRN [id number]  PCP:  Blane Ohara, MD  Cardiologist:  Gypsy Balsam, MD    Referring MD: Blane Ohara, MD   No chief complaint on file. Am doing fine but I have bronchitis  History of Present Illness:    Melinda Nguyen is a 59 y.o. female with past medical history significant for coronary artery disease, cardiac catheterization 1 year ago showed completely occluded circumflex artery.  At that time she was also find to have cardiomyopathy with ejection fraction 40 to 45%, she does have history of ventricular tachycardia, ICD is in place.  She did have repeated echocardiogram recently which showed ejection fraction 50%.  She comes today to my office for follow-up.  For last 2 weeks she is experiencing bronchitis she did have some cough, she was given Z-Pak by her primary care physician feels sling slightly better but not up to the part yet.  Denies have any chest pain, tightness, pressure, burning in the chest.  Denies having any discharges from the defibrillator.  Past Medical History:  Diagnosis Date  . AICD (automatic cardioverter/defibrillator) present 03/13/2019  . CAD (coronary artery disease)   . CHF (congestive heart failure) (HCC)   . Depression   . Diabetes mellitus without complication (HCC)   . GERD (gastroesophageal reflux disease)   . Hypertension   . Mixed hyperlipidemia   . Other hypotension   . Smoking history 03/10/2019   Smokes 1/2 pack a day  . Ventricular tachycardia [facility])     Past Surgical History:  Procedure Laterality Date  . ABDOMINAL HYSTERECTOMY    . ICD IMPLANT N/A 03/13/2019   Procedure: ICD IMPLANT;  Surgeon: Regan Lemming, MD;  Location: [facility] INVASIVE CV LAB;  Service: Cardiovascular;  Laterality: N/A;  . ICD IMPLANT  03/13/2019  . LEFT HEART CATH AND CORONARY ANGIOGRAPHY N/A 03/12/2019   Procedure: LEFT HEART CATH AND CORONARY ANGIOGRAPHY;  Surgeon: Iran Ouch, MD;  Location: [facility] INVASIVE CV LAB;  Service: Cardiovascular;  Laterality: N/A;    Current Medications: Current Meds  Medication Sig  . Ascorbic Acid (VITAMIN C) 1000 MG tablet Take 1,000 mg by mouth 2 (two) times a day.  Marland Kitchen aspirin EC 81 MG tablet Take 1 tablet (81 mg total) by mouth daily.  . benzonatate (TESSALON) 100 MG capsule Take 1 capsule (100 mg total) by mouth 2 (two) times daily as needed for cough.  . carvedilol (COREG) 3.125 MG tablet Take 1 tablet (3.125 mg total) by mouth 2 (two) times daily with a meal.  . DULoxetine (CYMBALTA) 30 MG capsule Take 1 capsule (30 mg total) by mouth at bedtime.  . furosemide (LASIX) 40 MG tablet Take 40 mg by mouth as needed.   Marland Kitchen LANTUS SOLOSTAR 100 UNIT/ML Solostar Pen Inject 20 Units into the skin at bedtime.  Marland Kitchen LORazepam (ATIVAN) 0.5 MG tablet Take 0.5 mg by mouth 2 (two) times daily as needed for anxiety.  Marland Kitchen losartan (COZAAR) 25 MG tablet Take 1 tablet (25 mg total) by mouth daily.  . metFORMIN (GLUCOPHAGE) 1000 MG tablet TAKE 1 TABLET BY MOUTH TWICE A DAY WITH MORNING AND EVENING MEALS  . nitroGLYCERIN (NITROSTAT) 0.4 MG SL tablet Place 1 tablet (0.4 mg total) under the tongue every 5 (five) minutes as needed.  Marland Kitchen omeprazole (PRILOSEC) 20 MG capsule Take 20 mg by mouth daily as needed (acid reflux).     Allergies:  Shrimp [shellfish allergy], Hydrocodone, Codeine, Flagyl [metronidazole], Glimepiride, and Victoza [liraglutide]   Social History   Socioeconomic History  . Marital status: Single    Spouse name: Not on file  . Number of children: Not on file  . Years of education: Not on file  . Highest education level: Not on file  Occupational History  . Not on file  Tobacco Use  . Smoking status: Current Some Day Smoker    Packs/day: 0.50    Types: Cigarettes  . Smokeless tobacco: Never Used  Vaping Use  . Vaping Use: Never used  Substance and Sexual Activity  . Alcohol use: Not Currently  . Drug use: Never  . Sexual  activity: Not on file  Other Topics Concern  . Not on file  Social History Narrative  . Not on file   Social Determinants of Health   Financial Resource Strain:   . Difficulty of Paying Living Expenses:   Food Insecurity:   . Worried About Programme researcher, broadcasting/film/video in the Last Year:   . Barista in the Last Year:   Transportation Needs:   . Freight forwarder (Medical):   Marland Kitchen Lack of Transportation (Non-Medical):   Physical Activity:   . Days of Exercise per Week:   . Minutes of Exercise per Session:   Stress:   . Feeling of Stress :   Social Connections:   . Frequency of Communication with Friends and Family:   . Frequency of Social Gatherings with Friends and Family:   . Attends Religious Services:   . Active Member of Clubs or Organizations:   . Attends Banker Meetings:   Marland Kitchen Marital Status:      Family History: The patient's family history includes COPD in her father; Diabetes in her mother; Hypertension in her mother. ROS:   Please see the history of present illness.    All 14 point review of systems negative except as described per history of present illness  EKGs/Labs/Other Studies Reviewed:      Recent Labs: 02/05/2020: ALT 11; BUN 9; Creatinine, Ser 0.56; Hemoglobin 14.0; Platelets 284; Potassium 4.4; Sodium 139  Recent Lipid Panel    Component Value Date/Time   CHOL 170 01/06/2020 1510   TRIG 169 (H) 01/06/2020 1510   HDL 50 01/06/2020 1510   CHOLHDL 3.4 01/06/2020 1510   CHOLHDL 3.7 03/11/2019 0101   VLDL 22 03/11/2019 0101   LDLCALC 91 01/06/2020 1510    Physical Exam:    VS:  BP (!) 150/70 (BP Location: Right Arm, Patient Position: Sitting, Cuff Size: Normal)   Pulse 93   Ht 5\' 4"  (1.626 m)   Wt 194 lb 3.2 oz (88.1 kg)   SpO2 94%   BMI 33.33 kg/m     Wt Readings from Last 3 Encounters:  04/06/20 194 lb 3.2 oz (88.1 kg)  04/01/20 192 lb (87.1 kg)  02/05/20 197 lb 6.4 oz (89.5 kg)     GEN:  Well nourished, well developed  in no acute distress HEENT: Normal NECK: No JVD; No carotid bruits LYMPHATICS: No lymphadenopathy CARDIAC: RRR, no murmurs, no rubs, no gallops RESPIRATORY:  Clear to auscultation without rales, wheezing or rhonchi  ABDOMEN: Soft, non-tender, non-distended MUSCULOSKELETAL:  No edema; No deformity  SKIN: Warm and dry LOWER EXTREMITIES: no swelling NEUROLOGIC:  Alert and oriented x 3 PSYCHIATRIC:  Normal affect   ASSESSMENT:    1. Old myocardial infarction   2. Ischemic cardiomyopathy   3. Essential  hypertension   4. Ventricular tachycardia (HCC)   5. Coronary artery disease involving native coronary artery of native heart without angina pectoris   6. ICD (implantable cardioverter-defibrillator) in place   7. Dyslipidemia    PLAN:    In order of problems listed above:  1. Myocardial infarction which is old.  Last echocardiogram showed ejection fraction 50%.  She is on small dose of beta-blocker previously we try higher dose however she was not able to tolerate it.  She is also on very small dose of ARB which I recommended to increase however she does not want to do it because last time when we did some maneuvering with her medication she feels lousy.  I was able however to convince her to increase dose of carvedilol from 1 tablet a day to twice tablets daily. 2. History of ventricular tachycardia ICD reading reviewed showing no evidence of any arrhythmia.  Continue present management.  Previously she had one episode of nonsustained ventricular tachycardia that does not require any therapy. 3. Coronary artery disease stable without any symptoms. 4. Dyslipidemia: She refused to take statin because she had some side effects of it.  We initiated conversation about PCSK9 agent, she is also willing to try Zetia 10 mg daily which I tried. 5. ICD present this is a Scientist, physiological, recent interrogation reviewed   Medication Adjustments/Labs and Tests Ordered: Current medicines are  reviewed at length with the patient today.  Concerns regarding medicines are outlined above.  No orders of the defined types were placed in this encounter.  Medication changes: No orders of the defined types were placed in this encounter.   Signed, Georgeanna Lea, MD, [facility] 04/06/2020 3:06 PM    [facility]

## 2020-04-08 ENCOUNTER — Encounter: Payer: BC Managed Care – PPO | Admitting: Family Medicine

## 2020-05-07 ENCOUNTER — Telehealth: Payer: Self-pay | Admitting: *Deleted

## 2020-05-07 NOTE — Telephone Encounter (Signed)
lmtcb  (to schedule follow up w/ Dr. Elberta Fortis in [place]. ICD implant 03/2019 & hasn't been seen by [facility] since implant) (we are receiving remotes from device)

## 2020-05-10 ENCOUNTER — Ambulatory Visit: Payer: BC Managed Care – PPO | Admitting: Family Medicine

## 2020-05-10 ENCOUNTER — Other Ambulatory Visit: Payer: Self-pay

## 2020-05-10 ENCOUNTER — Encounter: Payer: Self-pay | Admitting: Family Medicine

## 2020-05-10 VITALS — BP 120/64 | HR 84 | Temp 97.4°F | Resp 14 | Ht 64.0 in | Wt 190.6 lb

## 2020-05-10 DIAGNOSIS — I2510 Atherosclerotic heart disease of native coronary artery without angina pectoris: Secondary | ICD-10-CM | POA: Diagnosis not present

## 2020-05-10 DIAGNOSIS — E6609 Other obesity due to excess calories: Secondary | ICD-10-CM

## 2020-05-10 DIAGNOSIS — E1121 Type 2 diabetes mellitus with diabetic nephropathy: Secondary | ICD-10-CM

## 2020-05-10 DIAGNOSIS — Z95810 Presence of automatic (implantable) cardiac defibrillator: Secondary | ICD-10-CM

## 2020-05-10 DIAGNOSIS — I252 Old myocardial infarction: Secondary | ICD-10-CM

## 2020-05-10 DIAGNOSIS — I119 Hypertensive heart disease without heart failure: Secondary | ICD-10-CM

## 2020-05-10 DIAGNOSIS — Z6832 Body mass index (BMI) 32.0-32.9, adult: Secondary | ICD-10-CM

## 2020-05-10 DIAGNOSIS — R413 Other amnesia: Secondary | ICD-10-CM | POA: Insufficient documentation

## 2020-05-10 LAB — POCT UA - MICROALBUMIN: Microalbumin Ur, POC: 150 mg/L

## 2020-05-10 NOTE — Progress Notes (Signed)
Subjective:  Patient ID: Melinda Nguyen, female    DOB: [birth year]  Age: 59 y.o. MRN: [id number]  Chief Complaint  Patient presents with  . Hyperlipidemia  . Hypertension    Patient to be evaluated for type 2 diabetes mellitus with hyperglycemia.   Primary symptoms reported include fatigue.  She specifically denies blurred vision, peripheral neuropathy, polydipsia, polyphagia or polyuria.  Current meds include an oral hypoglycemic ( Glucophage and lantus 20 U daily ).  She reports home blood glucose readings have averaged fasting readings in the 130s mg/dL range. She checks her glucose 1 times per day.  In regard to preventative care, she performs foot self-exams daily and her last ophthalmology exam > 1 yr. Scheduled in 06/2020.  Patient is eating fairly healthy and exercising.     Pt presents with hyperlipidemia.  Supposed to start zetia 10 mg once daily, but was sick so waited. She is not taking lipitor because of muscle pain.    Eating a low cholesterol/low fat diet.  She maintains her exercise regimen.      Atherosclerotic heart disease of native coronary artery with other forms of angina pectoris details; she is here today for routine follow-up.  Melinda Nguyen has a prior history of a myocardial infarction. Her heart disease was first diagnosed 03/2019.  The course of the disease has been stable.  Currently, her treatment regimen consists of Coreg, a diuretic ( furosemide ) prn, losartan 25 mg once daily, aspirin 81 mg once dailly, and a nitrate ( p.r.n. sublingual nitroglycerin ).  No associated symptoms are reported.  A cardiac cath has been done ( performed 03/2019; official report shows left circumflex. appears to have had a plaque which has resolved. ).  Recent echo normal.    Past Medical History:  Diagnosis Date  . AICD (automatic cardioverter/defibrillator) present 03/13/2019  . CAD (coronary artery disease)   . CHF (congestive heart failure) (HCC)   . Depression   . Diabetes mellitus without  complication (HCC)   . GERD (gastroesophageal reflux disease)   . Hypertension   . Mixed hyperlipidemia   . Other hypotension   . Smoking history 03/10/2019   Smokes 1/2 pack a day  . Ventricular tachycardia [facility])    Past Surgical History:  Procedure Laterality Date  . ABDOMINAL HYSTERECTOMY    . ICD IMPLANT N/A 03/13/2019   Procedure: ICD IMPLANT;  Surgeon: Regan Lemming, MD;  Location: [facility] INVASIVE CV LAB;  Service: Cardiovascular;  Laterality: N/A;  . ICD IMPLANT  03/13/2019  . LEFT HEART CATH AND CORONARY ANGIOGRAPHY N/A 03/12/2019   Procedure: LEFT HEART CATH AND CORONARY ANGIOGRAPHY;  Surgeon: Iran Ouch, MD;  Location: [facility] INVASIVE CV LAB;  Service: Cardiovascular;  Laterality: N/A;    Family History  Problem Relation Age of Onset  . Hypertension Mother   . Diabetes Mother   . COPD Father    Social History   Socioeconomic History  . Marital status: Single    Spouse name: Not on file  . Number of children: Not on file  . Years of education: Not on file  . Highest education level: Not on file  Occupational History  . Not on file  Tobacco Use  . Smoking status: Former Smoker    Packs/day: 0.50    Types: Cigarettes    Quit date: 03/2019    Years since quitting: 1.1  . Smokeless tobacco: Never Used  Vaping Use  . Vaping Use: Never used  Substance and Sexual Activity  .  Alcohol use: Not Currently  . Drug use: Never  . Sexual activity: Not on file  Other Topics Concern  . Not on file  Social History Narrative  . Not on file   Social Determinants of Health   Financial Resource Strain:   . Difficulty of Paying Living Expenses:   Food Insecurity:   . Worried About Programme researcher, broadcasting/film/video in the Last Year:   . Barista in the Last Year:   Transportation Needs:   . Freight forwarder (Medical):   Marland Kitchen Lack of Transportation (Non-Medical):   Physical Activity:   . Days of Exercise per Week:   . Minutes of Exercise per Session:   Stress:   .  Feeling of Stress :   Social Connections:   . Frequency of Communication with Friends and Family:   . Frequency of Social Gatherings with Friends and Family:   . Attends Religious Services:   . Active Member of Clubs or Organizations:   . Attends Banker Meetings:   Marland Kitchen Marital Status:     Review of Systems  Constitutional: Negative for chills, fatigue and fever.  HENT: Negative for congestion, ear pain, rhinorrhea and sore throat.   Respiratory: Negative for cough and shortness of breath.   Cardiovascular: Negative for chest pain.  Gastrointestinal: Negative for abdominal pain, constipation, diarrhea, nausea and vomiting.  Endocrine: Negative for polydipsia, polyphagia and polyuria.  Genitourinary: Negative for dysuria and urgency.  Musculoskeletal: Negative for back pain and myalgias.  Neurological: Positive for numbness. Negative for dizziness, weakness, light-headedness and headaches.  Psychiatric/Behavioral: Negative for dysphoric mood and sleep disturbance (melatonin 3 mg at night.). The patient is not nervous/anxious.      Objective:  BP (!) 120/64   Pulse 84   Temp (!) 97.4 F (36.3 C)   Resp 14   Ht 5\' 4"  (1.626 m)   Wt 190 lb 9.6 oz (86.5 kg)   BMI 32.72 kg/m   BP/Weight 05/10/2020 04/06/2020 04/01/2020  Systolic BP 120 150 -  Diastolic BP 64 70 -  Wt. (Lbs) 190.6 194.2 192  BMI 32.72 33.33 32.96    Physical Exam Vitals reviewed.  Constitutional:      Appearance: Normal appearance. She is normal weight.  Neck:     Vascular: No carotid bruit.  Cardiovascular:     Rate and Rhythm: Normal rate and regular rhythm.     Pulses: Normal pulses.     Heart sounds: Normal heart sounds. No murmur heard.   Pulmonary:     Effort: Pulmonary effort is normal. No respiratory distress.     Breath sounds: Normal breath sounds.  Abdominal:     General: Abdomen is flat. Bowel sounds are normal.     Palpations: Abdomen is soft.     Tenderness: There is no  abdominal tenderness.  Neurological:     Mental Status: She is alert and oriented to person, place, and time.  Psychiatric:        Mood and Affect: Mood normal.        Behavior: Behavior normal.     Lab Results  Component Value Date   WBC 9.6 05/10/2020   HGB 14.1 05/10/2020   HCT 43.4 05/10/2020   PLT 287 05/10/2020   GLUCOSE 115 (H) 05/10/2020   CHOL 209 (H) 05/10/2020   TRIG 143 05/10/2020   HDL 56 05/10/2020   LDLCALC 128 (H) 05/10/2020   ALT 6 05/10/2020   AST 9 05/10/2020  NA 141 05/10/2020   K 4.8 05/10/2020   CL 102 05/10/2020   CREATININE 0.60 05/10/2020   BUN 7 (L) 05/10/2020   CO2 25 05/10/2020   HGBA1C 7.8 (H) 05/10/2020   MICROALBUR 150 05/10/2020      Assessment & Plan:  1. Mixed hyperlipidemia Well controlled.  No changes to medicines.  Continue to work on eating a healthy diet and exercise.  Labs drawn today.   2. Hypertensive heart disease with chronic combined systolic and diastolic congestive heart failure (HCC) Fairly controlled. Heart failure compensated.  Continue to work on eating a healthy diet and exercise.  Labs drawn today.  - Comprehensive metabolic panel  3. Diabetic glomerulopathy (HCC) Control: Improved. Recommend check sugars fasting daily. Recommend check feet daily. Recommend annual eye exams. Medicines:  metformin and increase lantus 24 U daily. Increase losartan to 50 mg once daily. Continue to work on eating a healthy diet and exercise.  Labs drawn today.   - POCT UA - Microalbumin - Hemoglobin A1c - CBC with Differential/Platelet  4. Class 2 obesity due to excess calories with serious comorbidity and body mass index (BMI) of 32 in adult Recommend continue to work on eating healthy diet and exercise.  5. Old myocardial infarction Continue to follow up with cardiology regularly.  6. ICD (implantable cardioverter-defibrillator) in place Continue to follow up with cardiology regularly.  Follow-up: Return in about 3  months (around 08/10/2020) for fasting.  An After Visit Summary was printed and given to the patient.  Blane Ohara [facility] [phone]

## 2020-05-11 ENCOUNTER — Other Ambulatory Visit: Payer: Self-pay

## 2020-05-11 LAB — CBC WITH DIFFERENTIAL/PLATELET
Basophils Absolute: 0.1 10*3/uL (ref 0.0–0.2)
Basos: 1 %
EOS (ABSOLUTE): 0.4 10*3/uL (ref 0.0–0.4)
Eos: 5 %
Hematocrit: 43.4 % (ref 34.0–46.6)
Hemoglobin: 14.1 g/dL (ref 11.1–15.9)
Immature Grans (Abs): 0 10*3/uL (ref 0.0–0.1)
Immature Granulocytes: 0 %
Lymphocytes Absolute: 2.7 10*3/uL (ref 0.7–3.1)
Lymphs: 28 %
MCH: 27.8 pg (ref 26.6–33.0)
MCHC: 32.5 g/dL (ref 31.5–35.7)
MCV: 85 fL (ref 79–97)
Monocytes Absolute: 0.7 10*3/uL (ref 0.1–0.9)
Monocytes: 7 %
Neutrophils Absolute: 5.7 10*3/uL (ref 1.4–7.0)
Neutrophils: 59 %
Platelets: 287 10*3/uL (ref 150–450)
RBC: 5.08 x10E6/uL (ref 3.77–5.28)
RDW: 14.1 % (ref 11.7–15.4)
WBC: 9.6 10*3/uL (ref 3.4–10.8)

## 2020-05-11 LAB — LIPID PANEL
Chol/HDL Ratio: 3.7 ratio (ref 0.0–4.4)
Cholesterol, Total: 209 mg/dL — ABNORMAL HIGH (ref 100–199)
HDL: 56 mg/dL (ref >39)
LDL Chol Calc (NIH): 128 mg/dL — ABNORMAL HIGH (ref 0–99)
Triglycerides: 143 mg/dL (ref 0–149)
VLDL Cholesterol Cal: 25 mg/dL (ref 5–40)

## 2020-05-11 LAB — COMPREHENSIVE METABOLIC PANEL
ALT: 6 IU/L (ref 0–32)
AST: 9 IU/L (ref 0–40)
Albumin/Globulin Ratio: 1.5 (ref 1.2–2.2)
Albumin: 4.3 g/dL (ref 3.8–4.9)
Alkaline Phosphatase: 106 IU/L (ref 48–121)
BUN/Creatinine Ratio: 12 (ref 12–28)
BUN: 7 mg/dL — ABNORMAL LOW (ref 8–27)
Bilirubin Total: 0.4 mg/dL (ref 0.0–1.2)
CO2: 25 mmol/L (ref 20–29)
Calcium: 9.4 mg/dL (ref 8.7–10.3)
Chloride: 102 mmol/L (ref 96–106)
Creatinine, Ser: 0.6 mg/dL (ref 0.57–1.00)
GFR calc Af Amer: 115 mL/min/{1.73_m2} (ref >59)
GFR calc non Af Amer: 99 mL/min/{1.73_m2} (ref >59)
Globulin, Total: 2.9 g/dL (ref 1.5–4.5)
Glucose: 115 mg/dL — ABNORMAL HIGH (ref 65–99)
Potassium: 4.8 mmol/L (ref 3.5–5.2)
Sodium: 141 mmol/L (ref 134–144)
Total Protein: 7.2 g/dL (ref 6.0–8.5)

## 2020-05-11 LAB — CARDIOVASCULAR RISK ASSESSMENT

## 2020-05-11 LAB — HEMOGLOBIN A1C
Est. average glucose Bld gHb Est-mCnc: 177 mg/dL
Hgb A1c MFr Bld: 7.8 % — ABNORMAL HIGH (ref 4.8–5.6)

## 2020-05-12 ENCOUNTER — Other Ambulatory Visit: Payer: Self-pay

## 2020-05-12 ENCOUNTER — Other Ambulatory Visit: Payer: Self-pay | Admitting: Family Medicine

## 2020-05-12 MED ORDER — LANTUS SOLOSTAR 100 UNIT/ML ~~LOC~~ SOPN: 20.0000 [IU] | PEN_INJECTOR | Freq: Every day | SUBCUTANEOUS | Status: DC

## 2020-05-12 MED ORDER — LANTUS SOLOSTAR 100 UNIT/ML ~~LOC~~ SOPN: 20.0000 [IU] | PEN_INJECTOR | Freq: Every day | SUBCUTANEOUS | 1 refills | Status: DC

## 2020-05-13 ENCOUNTER — Other Ambulatory Visit: Payer: Self-pay | Admitting: Family Medicine

## 2020-05-26 NOTE — Telephone Encounter (Signed)
Pt scheduled to see Camnitz 10/11 in [place]. She is agreeable to plan.

## 2020-05-31 ENCOUNTER — Other Ambulatory Visit: Payer: Self-pay

## 2020-05-31 ENCOUNTER — Ambulatory Visit (INDEPENDENT_AMBULATORY_CARE_PROVIDER_SITE_OTHER): Payer: BC Managed Care – PPO | Admitting: Family Medicine

## 2020-05-31 VITALS — BP 136/58 | HR 88 | Temp 97.2°F | Resp 18 | Ht 64.0 in | Wt 190.4 lb

## 2020-05-31 DIAGNOSIS — Z0000 Encounter for general adult medical examination without abnormal findings: Secondary | ICD-10-CM

## 2020-05-31 NOTE — Progress Notes (Signed)
Patient left without being seen because I was so behind. Dr. Sedalia Muta

## 2020-06-11 ENCOUNTER — Ambulatory Visit (INDEPENDENT_AMBULATORY_CARE_PROVIDER_SITE_OTHER): Payer: BC Managed Care – PPO | Admitting: *Deleted

## 2020-06-11 DIAGNOSIS — I255 Ischemic cardiomyopathy: Secondary | ICD-10-CM

## 2020-06-12 LAB — CUP PACEART REMOTE DEVICE CHECK
Battery Remaining Longevity: 82 mo
Battery Remaining Percentage: 86 %
Battery Voltage: 3.04 V
Brady Statistic RV Percent Paced: 1 %
Date Time Interrogation Session: 20210903020015
HighPow Impedance: 70 Ohm
HighPow Impedance: 70 Ohm
Implantable Lead Implant Date: 20200604
Implantable Lead Location: 753860
Implantable Pulse Generator Implant Date: 20200604
Lead Channel Impedance Value: 490 Ohm
Lead Channel Pacing Threshold Amplitude: 1 V
Lead Channel Pacing Threshold Pulse Width: 0.5 ms
Lead Channel Sensing Intrinsic Amplitude: 7.1 mV
Lead Channel Setting Pacing Amplitude: 3.5 V
Lead Channel Setting Pacing Pulse Width: 0.5 ms
Lead Channel Setting Sensing Sensitivity: 0.5 mV
Pulse Gen Serial Number: 9860889

## 2020-06-15 NOTE — Progress Notes (Signed)
Remote ICD transmission.   

## 2020-06-26 ENCOUNTER — Other Ambulatory Visit: Payer: Self-pay | Admitting: Family Medicine

## 2020-07-19 ENCOUNTER — Ambulatory Visit (INDEPENDENT_AMBULATORY_CARE_PROVIDER_SITE_OTHER): Payer: BC Managed Care – PPO | Admitting: Cardiology

## 2020-07-19 ENCOUNTER — Other Ambulatory Visit: Payer: Self-pay

## 2020-07-19 ENCOUNTER — Encounter: Payer: Self-pay | Admitting: Cardiology

## 2020-07-19 VITALS — BP 132/58 | HR 100 | Ht 64.0 in | Wt 190.2 lb

## 2020-07-19 DIAGNOSIS — I255 Ischemic cardiomyopathy: Secondary | ICD-10-CM | POA: Diagnosis not present

## 2020-07-19 DIAGNOSIS — Z95810 Presence of automatic (implantable) cardiac defibrillator: Secondary | ICD-10-CM | POA: Diagnosis not present

## 2020-07-19 DIAGNOSIS — I472 Ventricular tachycardia, unspecified: Secondary | ICD-10-CM

## 2020-07-19 NOTE — Progress Notes (Signed)
Electrophysiology Office Note   Date:  07/19/2020   ID:  Melinda Nguyen, DOB 04-11-1961, MRN [id number]  PCP:  Blane Ohara, MD  Cardiologist:  Bing Matter Primary Electrophysiologist:  Regan Lemming, MD    Chief Complaint: VT   History of Present Illness: Melinda Nguyen is a 59 y.o. female who is being seen today for the evaluation of VT at the request of Cox, Fritzi Mandes, MD. Presenting today for electrophysiology evaluation.  She has a history significant for poorly controlled diabetes, obesity, tobacco abuse, CHF, hyperlipidemia, coronary artery disease.  She presented to [facility] June 2020 with persistent nausea, vomiting.  She went to her primary physician's office prior to that and was noted to be in ventricular tachycardia.  She is now status post [employer] ICD implanted 03/13/2019.  Today, she denies symptoms of palpitations, chest pain, shortness of breath, orthopnea, PND, lower extremity edema, claudication, dizziness, presyncope, syncope, bleeding, or neurologic sequela. The patient is tolerating medications without difficulties.  She feels well today.  She has no chest pain or shortness of breath.  She has had no arrhythmias noted on device interrogation.  Unfortunately last winter, her husband died of pancreatic cancer.  He had undergone 2 with Whipple procedures.  She has restarted smoking.  She was smoking up to a pack a day, but is now down to half pack a day and is working on completely stopping.  She also gained quite a bit of weight after her husband's death and is now losing the weight.   Past Medical History:  Diagnosis Date  . AICD (automatic cardioverter/defibrillator) present 03/13/2019  . CAD (coronary artery disease)   . CHF (congestive heart failure) (HCC)   . Depression   . Diabetes mellitus without complication (HCC)   . GERD (gastroesophageal reflux disease)   . Hypertension   . Mixed hyperlipidemia   . Other hypotension   . Smoking history  03/10/2019   Smokes 1/2 pack a day  . Ventricular tachycardia [facility])    Past Surgical History:  Procedure Laterality Date  . ABDOMINAL HYSTERECTOMY    . ICD IMPLANT N/A 03/13/2019   Procedure: ICD IMPLANT;  Surgeon: Regan Lemming, MD;  Location: [facility] INVASIVE CV LAB;  Service: Cardiovascular;  Laterality: N/A;  . ICD IMPLANT  03/13/2019  . LEFT HEART CATH AND CORONARY ANGIOGRAPHY N/A 03/12/2019   Procedure: LEFT HEART CATH AND CORONARY ANGIOGRAPHY;  Surgeon: Iran Ouch, MD;  Location: [facility] INVASIVE CV LAB;  Service: Cardiovascular;  Laterality: N/A;     Current Outpatient Medications  Medication Sig Dispense Refill  . Ascorbic Acid (VITAMIN C) 1000 MG tablet Take 1,000 mg by mouth 2 (two) times a day.    Marland Kitchen aspirin EC 81 MG tablet Take 1 tablet (81 mg total) by mouth daily. 90 tablet 3  . carvedilol (COREG) 3.125 MG tablet TAKE 1 TABLET (3.125 MG TOTAL) BY MOUTH 2 (TWO) TIMES DAILY WITH A MEAL. 180 tablet 1  . DULoxetine (CYMBALTA) 30 MG capsule Take 1 capsule (30 mg total) by mouth at bedtime. 90 capsule 1  . furosemide (LASIX) 40 MG tablet TAKE 1 TABLET (40 MG) BY ORAL ROUTE DAILY 90 tablet 1  . insulin glargine (LANTUS) 100 UNIT/ML Solostar Pen Inject 20 Units into the skin daily.     Marland Kitchen LORazepam (ATIVAN) 0.5 MG tablet Take 0.5 mg by mouth 2 (two) times daily as needed for anxiety.    Marland Kitchen losartan (COZAAR) 25 MG tablet Take 25 mg by mouth  daily.    . metFORMIN (GLUCOPHAGE) 1000 MG tablet TAKE 1 TABLET BY MOUTH TWICE A DAY WITH MORNING AND EVENING MEALS 180 tablet 1  . nitroGLYCERIN (NITROSTAT) 0.4 MG SL tablet Place 1 tablet (0.4 mg total) under the tongue every 5 (five) minutes as needed. 25 tablet 12  . omeprazole (PRILOSEC) 20 MG capsule Take 20 mg by mouth daily as needed (acid reflux).     No current facility-administered medications for this visit.    Allergies:   Shrimp [shellfish allergy], Hydrocodone, Codeine, Flagyl [metronidazole], Glimepiride, and Victoza  [liraglutide]   Social History:  The patient  reports that she quit smoking about 16 months ago. Her smoking use included cigarettes. She smoked 0.50 packs per day. She has never used smokeless tobacco. She reports previous alcohol use. She reports that she does not use drugs.   Family History:  The patient's family history includes COPD in her father; Diabetes in her mother; Hypertension in her mother.    ROS:  Please see the history of present illness.   Otherwise, review of systems is positive for none.   All other systems are reviewed and negative.    PHYSICAL EXAM: VS:  BP (!) 132/58   Pulse 100   Ht 5\' 4"  (1.626 m)   Wt 190 lb 3.2 oz (86.3 kg)   SpO2 97%   BMI 32.65 kg/m  , BMI Body mass index is 32.65 kg/m. GEN: Well nourished, well developed, in no acute distress  HEENT: normal  Neck: no JVD, carotid bruits, or masses Cardiac: RRR; no murmurs, rubs, or gallops,no edema  Respiratory:  clear to auscultation bilaterally, normal work of breathing GI: soft, nontender, nondistended, + BS MS: no deformity or atrophy  Skin: warm and dry, device pocket is well healed Neuro:  Strength and sensation are intact Psych: euthymic mood, full affect  EKG:  EKG is not ordered today. Personal review of the ekg ordered 04/06/20 shows sinus rhythm, rate 90.  Device interrogation is reviewed today in detail.  See PaceArt for details.   Recent Labs: 05/10/2020: ALT 6; BUN 7; Creatinine, Ser 0.60; Hemoglobin 14.1; Platelets 287; Potassium 4.8; Sodium 141    Lipid Panel     Component Value Date/Time   CHOL 209 (H) 05/10/2020 0907   TRIG 143 05/10/2020 0907   HDL 56 05/10/2020 0907   CHOLHDL 3.7 05/10/2020 0907   CHOLHDL 3.7 03/11/2019 0101   VLDL 22 03/11/2019 0101   LDLCALC 128 (H) 05/10/2020 0907     Wt Readings from Last 3 Encounters:  07/19/20 190 lb 3.2 oz (86.3 kg)  05/31/20 190 lb 6.4 oz (86.4 kg)  05/10/20 190 lb 9.6 oz (86.5 kg)      Other studies  Reviewed: Additional studies/ records that were reviewed today include: TTE 01/13/2020 Review of the above records today demonstrates:  1. Left ventricular ejection fraction, by estimation, is 50 to 55%. The  left ventricle has low normal function. Left ventricular endocardial  border not optimally defined to evaluate regional wall motion. There is  moderate concentric left ventricular  hypertrophy. Left ventricular diastolic parameters are consistent with  Grade I diastolic dysfunction (impaired relaxation).  2. Right ventricular systolic function is normal. The right ventricular  size is normal.  3. The mitral valve is normal in structure. No evidence of mitral valve  regurgitation. No evidence of mitral stenosis.  4. The aortic valve is tricuspid. Aortic valve regurgitation is not  visualized. No aortic stenosis is present.  5.  The inferior vena cava is normal in size with greater than 50%  respiratory variability, suggesting right atrial pressure of 3 mmHg.   Left heart catheterization 03/12/2019  Prox Cx to Mid Cx lesion is 100% stenosed.  Prox LAD lesion is 30% stenosed.  Mid RCA lesion is 30% stenosed.   ASSESSMENT AND PLAN:  1.  Coronary artery disease: Has a chronically occluded circumflex with disease in the LAD and RCA.  No current chest pain.  2.  Ventricular tachycardia: Is status post [facility] Jude ICD implanted June 2020.  Device functioning appropriately.  No changes at this time.  3.  Hyperlipidemia: Has refused statin.  Plan per primary cardiology.    Current medicines are reviewed at length with the patient today.   The patient does not have concerns regarding her medicines.  The following changes were made today:  none  Labs/ tests ordered today include:  No orders of the defined types were placed in this encounter.    Disposition:   FU with Huzaifa Viney 1 year  Signed, Nora Rooke Jorja Loa, MD  07/19/2020 3:30 PM     [facility] [address] [place] [address] [phone] (office) [phone] (fax)

## 2020-08-11 NOTE — Progress Notes (Signed)
Subjective:  Patient ID: Melinda Nguyen, female    DOB: Sep 25, 1961  Age: 59 y.o. MRN: [id number]  Chief Complaint  Patient presents with  . Diabetes  . Hyperlipidemia    Patient to be evaluated for type 2 diabetes mellitus with hyperglycemia.   Primary symptoms reported include fatigue.  She specifically denies blurred vision, peripheral neuropathy, polydipsia, polyphagia or polyuria.  Current meds include an oral hypoglycemic ( Glucophage and lantus 20 U daily ).  She reports home blood glucose readings have averaged fasting readings in the 72 - 150.  She checks her glucose 1 times per day.  In regard to preventative care, she performs foot self-exams daily and her last ophthalmology exam 06/2020 diabetic retinopathy  Patient is eating fairly healthy and exercising. At last visit I recommended increase losartan to 50 mg, but pt did not tolerate because bp dropped.     Pt presents with hyperlipidemia. Intolerant     Eating a low cholesterol/low fat diet.  She maintains her exercise regimen.      Atherosclerotic heart disease of native coronary artery with other forms of angina pectoris details; she is here today for routine follow-up.  Nihira has a prior history of a myocardial infarction. Her heart disease was first diagnosed 03/2019.  The course of the disease has been stable.  Currently, her treatment regimen consists of Coreg, a diuretic ( furosemide ) prn, losartan 25 mg once daily, aspirin 81 mg once dailly, and a nitrate ( p.r.n. sublingual nitroglycerin ).  No associated symptoms are reported.  A cardiac cath has been done ( performed 03/2019; official report shows left circumflex. appears to have had a plaque which has resolved. ).  Recent echo near normal. 50%.    Past Medical History:  Diagnosis Date  . AICD (automatic cardioverter/defibrillator) present 03/13/2019  . CAD (coronary artery disease)   . CHF (congestive heart failure) (HCC)   . Depression   . Diabetes mellitus without  complication (HCC)   . GERD (gastroesophageal reflux disease)   . Hypertension   . Mixed hyperlipidemia   . Other hypotension   . Smoking history 03/10/2019   Smokes 1/2 pack a day  . Ventricular tachycardia [facility])    Past Surgical History:  Procedure Laterality Date  . ABDOMINAL HYSTERECTOMY    . ICD IMPLANT N/A 03/13/2019   Procedure: ICD IMPLANT;  Surgeon: Regan Lemming, MD;  Location: [facility] INVASIVE CV LAB;  Service: Cardiovascular;  Laterality: N/A;  . ICD IMPLANT  03/13/2019  . LEFT HEART CATH AND CORONARY ANGIOGRAPHY N/A 03/12/2019   Procedure: LEFT HEART CATH AND CORONARY ANGIOGRAPHY;  Surgeon: Iran Ouch, MD;  Location: [facility] INVASIVE CV LAB;  Service: Cardiovascular;  Laterality: N/A;    Family History  Problem Relation Age of Onset  . Hypertension Mother   . Diabetes Mother   . COPD Father    Social History   Socioeconomic History  . Marital status: Single    Spouse name: Not on file  . Number of children: Not on file  . Years of education: Not on file  . Highest education level: Not on file  Occupational History  . Not on file  Tobacco Use  . Smoking status: Former Smoker    Packs/day: 0.50    Types: Cigarettes    Quit date: 03/2019    Years since quitting: 1.4  . Smokeless tobacco: Never Used  Vaping Use  . Vaping Use: Never used  Substance and Sexual Activity  . Alcohol  use: Not Currently  . Drug use: Never  . Sexual activity: Not on file  Other Topics Concern  . Not on file  Social History Narrative  . Not on file   Social Determinants of Health   Financial Resource Strain:   . Difficulty of Paying Living Expenses: Not on file  Food Insecurity:   . Worried About Programme researcher, broadcasting/film/video in the Last Year: Not on file  . Ran Out of Food in the Last Year: Not on file  Transportation Needs:   . Lack of Transportation (Medical): Not on file  . Lack of Transportation (Non-Medical): Not on file  Physical Activity:   . Days of Exercise per Week: Not  on file  . Minutes of Exercise per Session: Not on file  Stress:   . Feeling of Stress : Not on file  Social Connections:   . Frequency of Communication with Friends and Family: Not on file  . Frequency of Social Gatherings with Friends and Family: Not on file  . Attends Religious Services: Not on file  . Active Member of Clubs or Organizations: Not on file  . Attends Banker Meetings: Not on file  . Marital Status: Not on file    Review of Systems  Constitutional: Negative for chills, fatigue and fever.  HENT: Positive for ear pain (right). Negative for congestion, rhinorrhea and sore throat.   Respiratory: Negative for cough and shortness of breath.   Cardiovascular: Negative for chest pain and palpitations.  Gastrointestinal: Negative for abdominal pain, constipation, diarrhea, nausea and vomiting.  Endocrine: Negative for polydipsia, polyphagia and polyuria.  Genitourinary: Negative for dysuria and urgency.  Musculoskeletal: Negative for back pain and myalgias.  Neurological: Positive for numbness (feet ). Negative for dizziness, weakness, light-headedness and headaches.  Psychiatric/Behavioral: Positive for dysphoric mood. Sleep disturbance: melatonin 3 mg at night. The patient is not nervous/anxious.      Objective:  There were no vitals taken for this visit.  BP/Weight 07/19/2020 05/31/2020 05/10/2020  Systolic BP 132 136 120  Diastolic BP 58 58 64  Wt. (Lbs) 190.2 190.4 190.6  BMI 32.65 32.68 32.72    Physical Exam Vitals reviewed.  Constitutional:      Appearance: Normal appearance. She is normal weight.  HENT:     Right Ear: Tympanic membrane normal.     Left Ear: Tympanic membrane normal.     Nose: Congestion present.     Comments: Yellow mucous. Right sinus tenderness.     Mouth/Throat:     Mouth: Mucous membranes are moist.     Pharynx: No oropharyngeal exudate or posterior oropharyngeal erythema.  Neck:     Vascular: No carotid bruit.    Cardiovascular:     Rate and Rhythm: Normal rate and regular rhythm.     Pulses: Normal pulses.     Heart sounds: Normal heart sounds. No murmur heard.   Pulmonary:     Effort: Pulmonary effort is normal. No respiratory distress.     Breath sounds: Normal breath sounds.  Abdominal:     General: Abdomen is flat. Bowel sounds are normal.     Palpations: Abdomen is soft.     Tenderness: There is no abdominal tenderness.  Neurological:     Mental Status: She is alert and oriented to person, place, and time.  Psychiatric:        Mood and Affect: Mood normal.        Behavior: Behavior normal.    Diabetic Foot Exam -  Simple   Simple Foot Form Diabetic Foot exam was performed with the following findings: Yes 08/12/2020  8:40 AM  Visual Inspection No deformities, no ulcerations, no other skin breakdown bilaterally: Yes Sensation Testing See comments: Yes Pulse Check Posterior Tibialis and Dorsalis pulse intact bilaterally: Yes Comments Numb on bottom of feet.      Lab Results  Component Value Date   WBC 9.6 05/10/2020   HGB 14.1 05/10/2020   HCT 43.4 05/10/2020   PLT 287 05/10/2020   GLUCOSE 115 (H) 05/10/2020   CHOL 209 (H) 05/10/2020   TRIG 143 05/10/2020   HDL 56 05/10/2020   LDLCALC 128 (H) 05/10/2020   ALT 6 05/10/2020   AST 9 05/10/2020   NA 141 05/10/2020   K 4.8 05/10/2020   CL 102 05/10/2020   CREATININE 0.60 05/10/2020   BUN 7 (L) 05/10/2020   CO2 25 05/10/2020   HGBA1C 7.8 (H) 05/10/2020   MICROALBUR 150 05/10/2020      Assessment & Plan:  1. Mixed hyperlipidemia Well controlled.  No changes to medicines.  Continue to work on eating a healthy diet and exercise.  Labs drawn today.  -Lipid  2. Hypertensive heart disease with chronic combined systolic and diastolic congestive heart failure (HCC) Fairly controlled. Heart failure compensated.  Continue to work on eating a healthy diet and exercise.  Labs drawn today.  - Comprehensive metabolic  panel  3. Diabetic glomerulopathy (HCC) and neuropathy. Control: well controlled.  Recommend check sugars fasting daily. Recommend check feet daily. Recommend annual eye exams. Medicines:  metformin and increase lantus 20 U daily Continue to work on eating a healthy diet and exercise.  Labs drawn today.   - Hemoglobin A1c - CBC with Differential/Platelet  4. Class 2 obesity due to excess calories with serious comorbidity and body mass index (BMI) of 32 in adult Recommend continue to work on eating healthy diet and exercise.  Follow-up: Return in about 3 months (around 11/12/2020) for fasting.  An After Visit Summary was printed and given to the patient.  Blane Ohara, MD [facility] [phone]

## 2020-08-12 ENCOUNTER — Other Ambulatory Visit: Payer: Self-pay

## 2020-08-12 ENCOUNTER — Encounter: Payer: Self-pay | Admitting: Family Medicine

## 2020-08-12 ENCOUNTER — Ambulatory Visit: Payer: BC Managed Care – PPO | Admitting: Family Medicine

## 2020-08-12 VITALS — BP 116/62 | HR 96 | Temp 96.3°F | Resp 18 | Ht 64.0 in | Wt 187.0 lb

## 2020-08-12 DIAGNOSIS — J0100 Acute maxillary sinusitis, unspecified: Secondary | ICD-10-CM

## 2020-08-12 DIAGNOSIS — I5042 Chronic combined systolic (congestive) and diastolic (congestive) heart failure: Secondary | ICD-10-CM | POA: Diagnosis not present

## 2020-08-12 DIAGNOSIS — E782 Mixed hyperlipidemia: Secondary | ICD-10-CM | POA: Diagnosis not present

## 2020-08-12 DIAGNOSIS — E1121 Type 2 diabetes mellitus with diabetic nephropathy: Secondary | ICD-10-CM

## 2020-08-12 DIAGNOSIS — I110 Hypertensive heart disease with heart failure: Secondary | ICD-10-CM | POA: Diagnosis not present

## 2020-08-12 MED ORDER — AZITHROMYCIN 500 MG PO TABS: ORAL_TABLET | ORAL | 0 refills | Status: DC

## 2020-08-12 NOTE — Patient Instructions (Addendum)
Recommend continue to work on eating healthy diet and exercise. Take tresiba 20 U daily when out of lantus.  I strongly recommend the covid 19 vaccinations (pfizer or moderna.)  Please also continue to practice social distancing, mask wearing, and hand washing. Please call if you develop symptoms of COVID (fever, cough, shortness of breath, congestion, aches, loss of taset/smell, headaches) regardless of vaccination status.Marland Kitchen

## 2020-08-13 LAB — COMPREHENSIVE METABOLIC PANEL
ALT: 12 IU/L (ref 0–32)
AST: 11 IU/L (ref 0–40)
Albumin/Globulin Ratio: 1.6 (ref 1.2–2.2)
Albumin: 4.3 g/dL (ref 3.8–4.9)
Alkaline Phosphatase: 101 IU/L (ref 44–121)
BUN/Creatinine Ratio: 12 (ref 12–28)
BUN: 8 mg/dL (ref 8–27)
Bilirubin Total: 0.4 mg/dL (ref 0.0–1.2)
CO2: 24 mmol/L (ref 20–29)
Calcium: 9.2 mg/dL (ref 8.7–10.3)
Chloride: 103 mmol/L (ref 96–106)
Creatinine, Ser: 0.68 mg/dL (ref 0.57–1.00)
GFR calc Af Amer: 110 mL/min/{1.73_m2} (ref >59)
GFR calc non Af Amer: 95 mL/min/{1.73_m2} (ref >59)
Globulin, Total: 2.7 g/dL (ref 1.5–4.5)
Glucose: 115 mg/dL — ABNORMAL HIGH (ref 65–99)
Potassium: 4.6 mmol/L (ref 3.5–5.2)
Sodium: 144 mmol/L (ref 134–144)
Total Protein: 7 g/dL (ref 6.0–8.5)

## 2020-08-13 LAB — LIPID PANEL
Chol/HDL Ratio: 3.7 ratio (ref 0.0–4.4)
Cholesterol, Total: 202 mg/dL — ABNORMAL HIGH (ref 100–199)
HDL: 54 mg/dL (ref >39)
LDL Chol Calc (NIH): 121 mg/dL — ABNORMAL HIGH (ref 0–99)
Triglycerides: 153 mg/dL — ABNORMAL HIGH (ref 0–149)
VLDL Cholesterol Cal: 27 mg/dL (ref 5–40)

## 2020-08-13 LAB — CBC WITH DIFFERENTIAL/PLATELET
Basophils Absolute: 0.1 10*3/uL (ref 0.0–0.2)
Basos: 1 %
EOS (ABSOLUTE): 0.3 10*3/uL (ref 0.0–0.4)
Eos: 3 %
Hematocrit: 42.5 % (ref 34.0–46.6)
Hemoglobin: 14 g/dL (ref 11.1–15.9)
Immature Grans (Abs): 0 10*3/uL (ref 0.0–0.1)
Immature Granulocytes: 0 %
Lymphocytes Absolute: 2.6 10*3/uL (ref 0.7–3.1)
Lymphs: 28 %
MCH: 27.5 pg (ref 26.6–33.0)
MCHC: 32.9 g/dL (ref 31.5–35.7)
MCV: 84 fL (ref 79–97)
Monocytes Absolute: 0.6 10*3/uL (ref 0.1–0.9)
Monocytes: 7 %
Neutrophils Absolute: 5.7 10*3/uL (ref 1.4–7.0)
Neutrophils: 61 %
Platelets: 270 10*3/uL (ref 150–450)
RBC: 5.09 x10E6/uL (ref 3.77–5.28)
RDW: 13.7 % (ref 11.7–15.4)
WBC: 9.3 10*3/uL (ref 3.4–10.8)

## 2020-08-13 LAB — HEMOGLOBIN A1C
Est. average glucose Bld gHb Est-mCnc: 163 mg/dL
Hgb A1c MFr Bld: 7.3 % — ABNORMAL HIGH (ref 4.8–5.6)

## 2020-08-13 LAB — CARDIOVASCULAR RISK ASSESSMENT

## 2020-08-14 ENCOUNTER — Encounter: Payer: Self-pay | Admitting: Family Medicine

## 2020-08-16 ENCOUNTER — Other Ambulatory Visit: Payer: Self-pay

## 2020-08-16 MED ORDER — INSULIN GLARGINE 100 UNIT/ML SOLOSTAR PEN
26.0000 [IU] | PEN_INJECTOR | Freq: Every day | SUBCUTANEOUS | 0 refills | Status: DC
Start: 2020-08-16 — End: 2020-11-04

## 2020-08-17 ENCOUNTER — Telehealth: Payer: Self-pay

## 2020-08-17 ENCOUNTER — Other Ambulatory Visit: Payer: Self-pay

## 2020-08-17 DIAGNOSIS — J0100 Acute maxillary sinusitis, unspecified: Secondary | ICD-10-CM

## 2020-08-17 MED ORDER — AZITHROMYCIN 500 MG PO TABS: ORAL_TABLET | ORAL | 0 refills | Status: DC

## 2020-08-18 NOTE — Telephone Encounter (Signed)
Melinda Nguyen called to report that she completed her antibiotic with improvement of her symptoms since completion of  the medication her ear pain has been returning.  She feels that she needs a refill on the medication.  Dr. Sedalia Muta approved a refill on her Z-Pak.

## 2020-08-20 ENCOUNTER — Other Ambulatory Visit: Payer: Self-pay

## 2020-08-20 ENCOUNTER — Encounter: Payer: Self-pay | Admitting: Cardiology

## 2020-08-20 ENCOUNTER — Ambulatory Visit: Payer: BC Managed Care – PPO | Admitting: Cardiology

## 2020-08-20 VITALS — BP 152/72 | HR 90 | Ht 64.0 in | Wt 189.0 lb

## 2020-08-20 DIAGNOSIS — I2510 Atherosclerotic heart disease of native coronary artery without angina pectoris: Secondary | ICD-10-CM

## 2020-08-20 DIAGNOSIS — E785 Hyperlipidemia, unspecified: Secondary | ICD-10-CM

## 2020-08-20 DIAGNOSIS — I255 Ischemic cardiomyopathy: Secondary | ICD-10-CM

## 2020-08-20 DIAGNOSIS — I472 Ventricular tachycardia, unspecified: Secondary | ICD-10-CM

## 2020-08-20 DIAGNOSIS — Z95810 Presence of automatic (implantable) cardiac defibrillator: Secondary | ICD-10-CM | POA: Diagnosis not present

## 2020-08-20 MED ORDER — EZETIMIBE 10 MG PO TABS: 10.0000 mg | ORAL_TABLET | Freq: Every day | ORAL | 1 refills | Status: DC

## 2020-08-20 NOTE — Progress Notes (Signed)
Cardiology Office Note:    Date:  08/20/2020   ID:  [name], DOB 03/31/1961, MRN [id number]  PCP:  Blane Ohara, MD  Cardiologist:  Gypsy Balsam, MD    Referring MD: Blane Ohara, MD   Chief Complaint  Patient presents with  . Follow-up  I am doing well  History of Present Illness:    Melinda Nguyen is a 59 y.o. female with past medical history significant for coronary artery disease, cardiac catheterization done more than year ago showed picture occluded circumflex artery. At the time she was also find to have diminished ejection fraction 4045%. She does have history of ventricular tachycardia AICD is in place it is in [place] device. She did not have any discharges from it. Overall she is doing very well she denies have any chest pain tightness squeezing pressure burning chest. She tells me that in 9 days she is now retired. She is looking forward to it.  Past Medical History:  Diagnosis Date  . AICD (automatic cardioverter/defibrillator) present 03/13/2019  . CAD (coronary artery disease)   . CHF (congestive heart failure) (HCC)   . Depression   . Diabetes mellitus without complication (HCC)   . GERD (gastroesophageal reflux disease)   . Hypertension   . Mixed hyperlipidemia   . Other hypotension   . Smoking history 03/10/2019   Smokes 1/2 pack a day  . Ventricular tachycardia [facility])     Past Surgical History:  Procedure Laterality Date  . ABDOMINAL HYSTERECTOMY    . ICD IMPLANT N/A 03/13/2019   Procedure: ICD IMPLANT;  Surgeon: Regan Lemming, MD;  Location[facility] INVASIVE CV LAB;  Service: Cardiovascular;  Laterality: N/A;  . ICD IMPLANT  03/13/2019  . LEFT HEART CATH AND CORONARY ANGIOGRAPHY N/A 03/12/2019   Procedure: LEFT HEART CATH AND CORONARY ANGIOGRAPHY;  Surgeon: Iran Ouch, MD;  Location: [facility] INVASIVE CV LAB;  Service: Cardiovascular;  Laterality: N/A;    Current Medications: Current Meds  Medication Sig  . Ascorbic Acid (VITAMIN C) 1000 MG  tablet Take 1,000 mg by mouth 2 (two) times a day.  Marland Kitchen aspirin EC 81 MG tablet Take 1 tablet (81 mg total) by mouth daily.  . carvedilol (COREG) 3.125 MG tablet TAKE 1 TABLET (3.125 MG TOTAL) BY MOUTH 2 (TWO) TIMES DAILY WITH A MEAL.  . DULoxetine (CYMBALTA) 30 MG capsule Take 1 capsule (30 mg total) by mouth at bedtime.  . furosemide (LASIX) 40 MG tablet TAKE 1 TABLET (40 MG) BY ORAL ROUTE DAILY  . insulin glargine (LANTUS) 100 UNIT/ML Solostar Pen Inject 26 Units into the skin daily.  Marland Kitchen LORazepam (ATIVAN) 0.5 MG tablet Take 0.5 mg by mouth 2 (two) times daily as needed for anxiety.  Marland Kitchen losartan (COZAAR) 25 MG tablet Take 25 mg by mouth daily.  . metFORMIN (GLUCOPHAGE) 1000 MG tablet TAKE 1 TABLET BY MOUTH TWICE A DAY WITH MORNING AND EVENING MEALS  . nitroGLYCERIN (NITROSTAT) 0.4 MG SL tablet Place 1 tablet (0.4 mg total) under the tongue every 5 (five) minutes as needed.  Marland Kitchen omeprazole (PRILOSEC) 20 MG capsule Take 20 mg by mouth daily as needed (acid reflux).     Allergies:   Shrimp [shellfish allergy], Hydrocodone, Codeine, Crestor [rosuvastatin], Farxiga [dapagliflozin], Flagyl [metronidazole], Glimepiride, Jardiance [empagliflozin], Lipitor [atorvastatin], and Victoza [liraglutide]   Social History   Socioeconomic History  . Marital status: Single    Spouse name: Not on file  . Number of children: Not on file  . Years of education:  Not on file  . Highest education level: Not on file  Occupational History  . Not on file  Tobacco Use  . Smoking status: Current Every Day Smoker    Packs/day: 0.50    Types: Cigarettes    Last attempt to quit: 03/2019    Years since quitting: 1.4  . Smokeless tobacco: Never Used  Vaping Use  . Vaping Use: Never used  Substance and Sexual Activity  . Alcohol use: Not Currently  . Drug use: Never  . Sexual activity: Not on file  Other Topics Concern  . Not on file  Social History Narrative  . Not on file   Social Determinants of Health    Financial Resource Strain:   . Difficulty of Paying Living Expenses: Not on file  Food Insecurity:   . Worried About Programme researcher, broadcasting/film/video in the Last Year: Not on file  . Ran Out of Food in the Last Year: Not on file  Transportation Needs:   . Lack of Transportation (Medical): Not on file  . Lack of Transportation (Non-Medical): Not on file  Physical Activity:   . Days of Exercise per Week: Not on file  . Minutes of Exercise per Session: Not on file  Stress:   . Feeling of Stress : Not on file  Social Connections:   . Frequency of Communication with Friends and Family: Not on file  . Frequency of Social Gatherings with Friends and Family: Not on file  . Attends Religious Services: Not on file  . Active Member of Clubs or Organizations: Not on file  . Attends Banker Meetings: Not on file  . Marital Status: Not on file     Family History: The patient's family history includes COPD in her father; Diabetes in her mother; Hypertension in her mother. ROS:   Please see the history of present illness.    All 14 point review of systems negative except as described per history of present illness  EKGs/Labs/Other Studies Reviewed:      Recent Labs: 08/12/2020: ALT 12; BUN 8; Creatinine, Ser 0.68; Hemoglobin 14.0; Platelets 270; Potassium 4.6; Sodium 144  Recent Lipid Panel    Component Value Date/Time   CHOL 202 (H) 08/12/2020 0848   TRIG 153 (H) 08/12/2020 0848   HDL 54 08/12/2020 0848   CHOLHDL 3.7 08/12/2020 0848   CHOLHDL 3.7 03/11/2019 0101   VLDL 22 03/11/2019 0101   LDLCALC 121 (H) 08/12/2020 0848    Physical Exam:    VS:  BP (!) 152/72 (BP Location: Right Arm, Patient Position: Sitting, Cuff Size: Large)   Pulse 90   Ht 5\' 4"  (1.626 m)   Wt 189 lb (85.7 kg)   SpO2 93%   BMI 32.44 kg/m     Wt Readings from Last 3 Encounters:  08/20/20 189 lb (85.7 kg)  08/12/20 187 lb (84.8 kg)  07/19/20 190 lb 3.2 oz (86.3 kg)     GEN:  Well nourished, well  developed in no acute distress HEENT: Normal NECK: No JVD; No carotid bruits LYMPHATICS: No lymphadenopathy CARDIAC: RRR, no murmurs, no rubs, no gallops RESPIRATORY:  Clear to auscultation without rales, wheezing or rhonchi  ABDOMEN: Soft, non-tender, non-distended MUSCULOSKELETAL:  No edema; No deformity  SKIN: Warm and dry LOWER EXTREMITIES: no swelling NEUROLOGIC:  Alert and oriented x 3 PSYCHIATRIC:  Normal affect   ASSESSMENT:    1. Ischemic cardiomyopathy   2. Coronary artery disease involving native coronary artery of native heart without  angina pectoris   3. ICD (implantable cardioverter-defibrillator) in place   4. Dyslipidemia   5. Ventricular tachycardia (HCC)    PLAN:    In order of problems listed above:  1. Ischemic cardiomyopathy last echocardiogram showed ejection fraction 50%. She is on Cozaar with Coreg. I wanted to talk to him about increasing dosages of this medications today, however she tells me when she check her blood pressure at home is usually 1 15-1 20. Therefore we will continue present medications. Next 2 months we will repeat echocardiogram if there be deterioration in her left ventricle ejection fraction then we will recommend her therapy. 2. ICD present: I did review interrogation still plenty of battery left, no therapies delivered. 3. Dyslipidemia: I will call primary care physician to get her fasting lipid profile. She has intolerance to statin however I was able to convince her today to start Zetia 10 mg daily. 4. History of ventricular tachycardia no detection by device, doing well recently seen by EP. I did review EP notes.   Medication Adjustments/Labs and Tests Ordered: Current medicines are reviewed at length with the patient today.  Concerns regarding medicines are outlined above.  No orders of the defined types were placed in this encounter.  Medication changes: No orders of the defined types were placed in this  encounter.   Signed, Georgeanna Lea, MD, [facility] 08/20/2020 1:14 PM    [facility]

## 2020-08-20 NOTE — Patient Instructions (Signed)
Medication Instructions:  °Your physician has recommended you make the following change in your medication:  ° °START: Zetia 10 mg daily  ° °*If you need a refill on your cardiac medications before your next appointment, please call your pharmacy* ° ° °Lab Work: °Your physician recommends that you return for lab work in 6 weeks: lipid  °If you have labs (blood work) drawn today and your tests are completely normal, you will receive your results only by: °• MyChart Message (if you have MyChart) OR °• A paper copy in the mail °If you have any lab test that is abnormal or we need to change your treatment, we will call you to review the results. ° ° °Testing/Procedures: °None. ° ° °Follow-Up: °At [facility], you and your health needs are our priority.  As part of our continuing mission to provide you with exceptional heart care, we have created designated Provider Care Teams.  These Care Teams include your primary Cardiologist (physician) and Advanced Practice Providers (APPs -  Physician Assistants and Nurse Practitioners) who all work together to provide you with the care you need, when you need it. ° °We recommend signing up for the patient portal called "MyChart".  Sign up information is provided on this After Visit Summary.  MyChart is used to connect with patients for Virtual Visits (Telemedicine).  Patients are able to view lab/test results, encounter notes, upcoming appointments, etc.  Non-urgent messages can be sent to your provider as well.   °To learn more about what you can do with MyChart, go to [email or web address].   ° °Your next appointment:   °6 month(s) ° °The format for your next appointment:   °In Person ° °Provider:   °Robert Krasowski, MD ° ° °Other Instructions ° °Ezetimibe Tablets °What is this medicine? °EZETIMIBE (ez ET i mibe) blocks the absorption of cholesterol from the stomach. It can help lower blood cholesterol for patients who are at risk of getting heart disease or a stroke. It  is only for patients whose cholesterol level is not controlled by diet. °This medicine may be used for other purposes; ask your health care provider or pharmacist if you have questions. °COMMON BRAND NAME(S): Zetia °What should I tell my health care provider before I take this medicine? °They need to know if you have any of these conditions: °· liver disease °· an unusual or allergic reaction to ezetimibe, medicines, foods, dyes, or preservatives °· pregnant or trying to get pregnant °· breast-feeding °How should I use this medicine? °Take this medicine by mouth with a glass of water. Follow the directions on the prescription label. This medicine can be taken with or without food. Take your doses at regular intervals. Do not take your medicine more often than directed. °Talk to your pediatrician regarding the use of this medicine in children. Special care may be needed. °Overdosage: If you think you have taken too much of this medicine contact a poison control center or emergency room at once. °NOTE: This medicine is only for you. Do not share this medicine with others. °What if I miss a dose? °If you miss a dose, take it as soon as you can. If it is almost time for your next dose, take only that dose. Do not take double or extra doses. °What may interact with this medicine? °Do not take this medicine with any of the following medications: °· fenofibrate °· gemfibrozil °This medicine may also interact with the following medications: °· antacids °· cyclosporine °· herbal medicines   like red yeast rice °· other medicines to lower cholesterol or triglycerides °This list may not describe all possible interactions. Give your health care provider a list of all the medicines, herbs, non-prescription drugs, or dietary supplements you use. Also tell them if you smoke, drink alcohol, or use illegal drugs. Some items may interact with your medicine. °What should I watch for while using this medicine? °Visit your doctor or  health care professional for regular checks on your progress. You will need to have your cholesterol levels checked. If you are also taking some other cholesterol medicines, you will also need to have tests to make sure your liver is working properly. °Tell your doctor or health care professional if you get any unexplained muscle pain, tenderness, or weakness, especially if you also have a fever and tiredness. °You need to follow a low-cholesterol, low-fat diet while you are taking this medicine. This will decrease your risk of getting heart and blood vessel disease. Exercising and avoiding alcohol and smoking can also help. Ask your doctor or dietician for advice. °What side effects may I notice from receiving this medicine? °Side effects that you should report to your doctor or health care professional as soon as possible: °· allergic reactions like skin rash, itching or hives, swelling of the face, lips, or tongue °· dark yellow or brown urine °· unusually weak or tired °· yellowing of the skin or eyes °Side effects that usually do not require medical attention (report to your doctor or health care professional if they continue or are bothersome): °· diarrhea °· dizziness °· headache °· stomach upset or pain °This list may not describe all possible side effects. Call your doctor for medical advice about side effects. You may report side effects to FDA at 1-800-FDA-1088. °Where should I keep my medicine? °Keep out of the reach of children. °Store at room temperature between 15 and 30 degrees C (59 and 86 degrees F). Protect from moisture. Keep container tightly closed. Throw away any unused medicine after the expiration date. °NOTE: This sheet is a summary. It may not cover all possible information. If you have questions about this medicine, talk to your doctor, pharmacist, or health care provider. °© 2020 [employer] (2012-04-01 15:39:09) ° ° °

## 2020-08-29 ENCOUNTER — Other Ambulatory Visit: Payer: Self-pay | Admitting: Family Medicine

## 2020-08-31 ENCOUNTER — Telehealth: Payer: Self-pay

## 2020-08-31 NOTE — Telephone Encounter (Signed)
Patient is requesting a different antibiotic be sent in for her ear pain, states the antibiotic that was sent in helped some however her ear pain has returned.

## 2020-08-31 NOTE — Telephone Encounter (Signed)
Pt has had 2 zpacks without improvement. Recommend appt in am. Paige called. Kc

## 2020-09-01 ENCOUNTER — Encounter: Payer: Self-pay | Admitting: Nurse Practitioner

## 2020-09-01 ENCOUNTER — Ambulatory Visit: Payer: BC Managed Care – PPO | Admitting: Nurse Practitioner

## 2020-09-01 ENCOUNTER — Other Ambulatory Visit: Payer: Self-pay

## 2020-09-01 ENCOUNTER — Other Ambulatory Visit: Payer: Self-pay | Admitting: Nurse Practitioner

## 2020-09-01 VITALS — BP 158/62 | HR 95 | Temp 97.8°F | Ht 64.0 in | Wt 191.0 lb

## 2020-09-01 DIAGNOSIS — Z95810 Presence of automatic (implantable) cardiac defibrillator: Secondary | ICD-10-CM

## 2020-09-01 DIAGNOSIS — I110 Hypertensive heart disease with heart failure: Secondary | ICD-10-CM | POA: Diagnosis not present

## 2020-09-01 DIAGNOSIS — J0100 Acute maxillary sinusitis, unspecified: Secondary | ICD-10-CM | POA: Diagnosis not present

## 2020-09-01 DIAGNOSIS — I252 Old myocardial infarction: Secondary | ICD-10-CM

## 2020-09-01 DIAGNOSIS — I5042 Chronic combined systolic (congestive) and diastolic (congestive) heart failure: Secondary | ICD-10-CM

## 2020-09-01 DIAGNOSIS — Z794 Long term (current) use of insulin: Secondary | ICD-10-CM

## 2020-09-01 DIAGNOSIS — E1165 Type 2 diabetes mellitus with hyperglycemia: Secondary | ICD-10-CM

## 2020-09-01 DIAGNOSIS — E1121 Type 2 diabetes mellitus with diabetic nephropathy: Secondary | ICD-10-CM

## 2020-09-01 MED ORDER — CARVEDILOL 3.125 MG PO TABS: 3.1250 mg | ORAL_TABLET | Freq: Two times a day (BID) | ORAL | 1 refills | Status: DC

## 2020-09-01 MED ORDER — AMOXICILLIN-POT CLAVULANATE 875-125 MG PO TABS: 1.0000 | ORAL_TABLET | Freq: Two times a day (BID) | ORAL | 0 refills | Status: DC

## 2020-09-01 MED ORDER — FLUCONAZOLE 150 MG PO TABS: 150.0000 mg | ORAL_TABLET | Freq: Once | ORAL | 0 refills | Status: AC

## 2020-09-01 NOTE — Progress Notes (Signed)
Acute Office Visit  Subjective:    Patient ID: Melinda Nguyen, female    DOB: Jun 13, 1961, 59 y.o.   MRN: [id number]  Chief Complaint  Patient presents with  . Sinusitis    still not better    HPI Patient is in today for recurrent sinusitis and bilateral ear pain. Onset of symptoms began 3-weeks ago. Treatment includes Tylenol and Z-PAK x 2 prescribed on 08/17/20 and 08/20/20. Emanii states she initially improved with antibiotics. Symptoms returned after completion of antibiotics. Past medical history includes CAD, MI, V-tach, AICD insertion,HF, HTN, Type 2 IDDM, Hyperlipidemia, depression, and cigarette smoking. She is attempting to quit smoking. She is looking forward to retiring from her job next Tuesday.    Past Medical History:  Diagnosis Date  . AICD (automatic cardioverter/defibrillator) present 03/13/2019  . CAD (coronary artery disease)   . CHF (congestive heart failure) (HCC)   . Depression   . Diabetes mellitus without complication (HCC)   . GERD (gastroesophageal reflux disease)   . Hypertension   . Mixed hyperlipidemia   . Other hypotension   . Smoking history 03/10/2019   Smokes 1/2 pack a day  . Ventricular tachycardia [facility])     Past Surgical History:  Procedure Laterality Date  . ABDOMINAL HYSTERECTOMY    . ICD IMPLANT N/A 03/13/2019   Procedure: ICD IMPLANT;  Surgeon: Regan Lemming, MD;  Location: [facility] INVASIVE CV LAB;  Service: Cardiovascular;  Laterality: N/A;  . ICD IMPLANT  03/13/2019  . LEFT HEART CATH AND CORONARY ANGIOGRAPHY N/A 03/12/2019   Procedure: LEFT HEART CATH AND CORONARY ANGIOGRAPHY;  Surgeon: Iran Ouch, MD;  Location: [facility] INVASIVE CV LAB;  Service: Cardiovascular;  Laterality: N/A;    Family History  Problem Relation Age of Onset  . Hypertension Mother   . Diabetes Mother   . COPD Father     Social History   Socioeconomic History  . Marital status: Single    Spouse name: Not on file  . Number of children: Not on file  .  Years of education: Not on file  . Highest education level: Not on file  Occupational History  . Not on file  Tobacco Use  . Smoking status: Current Every Day Smoker    Packs/day: 0.50    Types: Cigarettes    Last attempt to quit: 03/2019    Years since quitting: 1.4  . Smokeless tobacco: Never Used  Vaping Use  . Vaping Use: Never used  Substance and Sexual Activity  . Alcohol use: Not Currently  . Drug use: Never  . Sexual activity: Not on file  Other Topics Concern  . Not on file  Social History Narrative  . Not on file   Social Determinants of Health   Financial Resource Strain:   . Difficulty of Paying Living Expenses: Not on file  Food Insecurity:   . Worried About Programme researcher, broadcasting/film/video in the Last Year: Not on file  . Ran Out of Food in the Last Year: Not on file  Transportation Needs:   . Lack of Transportation (Medical): Not on file  . Lack of Transportation (Non-Medical): Not on file  Physical Activity:   . Days of Exercise per Week: Not on file  . Minutes of Exercise per Session: Not on file  Stress:   . Feeling of Stress : Not on file  Social Connections:   . Frequency of Communication with Friends and Family: Not on file  . Frequency of Social Gatherings with  Friends and Family: Not on file  . Attends Religious Services: Not on file  . Active Member of Clubs or Organizations: Not on file  . Attends Banker Meetings: Not on file  . Marital Status: Not on file  Intimate Partner Violence:   . Fear of Current or Ex-Partner: Not on file  . Emotionally Abused: Not on file  . Physically Abused: Not on file  . Sexually Abused: Not on file    Outpatient Medications Prior to Visit  Medication Sig Dispense Refill  . Ascorbic Acid (VITAMIN C) 1000 MG tablet Take 1,000 mg by mouth 2 (two) times a day.    Marland Kitchen aspirin EC 81 MG tablet Take 1 tablet (81 mg total) by mouth daily. 90 tablet 3  . carvedilol (COREG) 3.125 MG tablet TAKE 1 TABLET (3.125 MG  TOTAL) BY MOUTH 2 (TWO) TIMES DAILY WITH A MEAL. 180 tablet 1  . DULoxetine (CYMBALTA) 30 MG capsule TAKE 1 CAPSULE (30 MG TOTAL) BY MOUTH AT BEDTIME. 90 capsule 1  . ezetimibe (ZETIA) 10 MG tablet Take 1 tablet (10 mg total) by mouth daily. 90 tablet 1  . furosemide (LASIX) 40 MG tablet TAKE 1 TABLET (40 MG) BY ORAL ROUTE DAILY 90 tablet 1  . insulin glargine (LANTUS) 100 UNIT/ML Solostar Pen Inject 26 Units into the skin daily. 15 mL 0  . LORazepam (ATIVAN) 0.5 MG tablet Take 0.5 mg by mouth 2 (two) times daily as needed for anxiety.    Marland Kitchen losartan (COZAAR) 25 MG tablet Take 25 mg by mouth daily.    . metFORMIN (GLUCOPHAGE) 1000 MG tablet TAKE 1 TABLET BY MOUTH TWICE A DAY WITH MORNING AND EVENING MEALS 180 tablet 1  . nitroGLYCERIN (NITROSTAT) 0.4 MG SL tablet Place 1 tablet (0.4 mg total) under the tongue every 5 (five) minutes as needed. 25 tablet 12  . omeprazole (PRILOSEC) 20 MG capsule Take 20 mg by mouth daily as needed (acid reflux).     No facility-administered medications prior to visit.    Allergies  Allergen Reactions  . Shrimp [Shellfish Allergy] Anaphylaxis  . Hydrocodone Hives  . Codeine Nausea Only  . Crestor [Rosuvastatin] Other (See Comments)    Myalgia and joint pain  . Farxiga [Dapagliflozin]     Yeast infection   . Flagyl [Metronidazole] Hives  . Glimepiride Other (See Comments)    Heartburn   . Jardiance [Empagliflozin]     Yeast infection  . Lipitor [Atorvastatin]     Myalgia and joint pain  . Victoza [Liraglutide] Other (See Comments)    heartburn    Review of Systems  Constitutional: Positive for fatigue. Negative for fever.  HENT: Positive for ear pain (R>L;right ear pain radiating inferiorly toward neck), sinus pressure (congestion) and sinus pain (tenderness ). Negative for congestion and sore throat.   Eyes: Negative for pain.  Respiratory: Negative for cough, chest tightness, shortness of breath and wheezing.   Cardiovascular: Negative for chest  pain and palpitations.  Gastrointestinal: Negative for abdominal pain, constipation, diarrhea, nausea and vomiting.  Genitourinary: Negative for dysuria and hematuria.  Musculoskeletal: Negative for arthralgias, back pain, joint swelling and myalgias.  Skin: Negative for rash.  Allergic/Immunologic: Positive for environmental allergies.  Neurological: Positive for headaches (sinus ). Negative for dizziness and weakness.  Psychiatric/Behavioral: Negative for dysphoric mood. The patient is not nervous/anxious.        Objective:    Physical Exam Vitals reviewed.  Constitutional:      Appearance: Normal appearance.  HENT:     Head: Normocephalic.     Right Ear: Tenderness present. There is no impacted cerumen. There is mastoid tenderness.     Left Ear: Tympanic membrane, ear canal and external ear normal. There is no impacted cerumen.     Nose: Congestion present.     Mouth/Throat:     Lips: Pink.     Mouth: Mucous membranes are moist.     Dentition: Dental tenderness present.     Tongue: No lesions. Tongue does not deviate from midline.     Pharynx: Uvula midline. Posterior oropharyngeal erythema present. No oropharyngeal exudate.  Cardiovascular:     Rate and Rhythm: Normal rate and regular rhythm.     Pulses: Normal pulses.     Heart sounds: Normal heart sounds.  Pulmonary:     Effort: Pulmonary effort is normal.     Breath sounds: Normal breath sounds.  Abdominal:     General: Bowel sounds are normal.     Palpations: Abdomen is soft.  Musculoskeletal:        General: Normal range of motion.     Cervical back: Normal range of motion.  Skin:    General: Skin is warm and dry.     Capillary Refill: Capillary refill takes less than 2 seconds.  Neurological:     General: No focal deficit present.     Mental Status: She is alert and oriented to person, place, and time.  Psychiatric:        Mood and Affect: Mood normal.        Behavior: Behavior normal.        Thought  Content: Thought content normal.        Judgment: Judgment normal.     BP (!) 158/62 (BP Location: Left Arm, Patient Position: Sitting)   Pulse 95   Temp 97.8 F (36.6 C) (Temporal)   Ht 5\' 4"  (1.626 m)   Wt 191 lb (86.6 kg)   SpO2 96%   BMI 32.79 kg/m  Wt Readings from Last 3 Encounters:  09/01/20 191 lb (86.6 kg)  08/20/20 189 lb (85.7 kg)  08/12/20 187 lb (84.8 kg)    Lab Results  Component Value Date   WBC 9.3 08/12/2020   HGB 14.0 08/12/2020   HCT 42.5 08/12/2020   MCV 84 08/12/2020   PLT 270 08/12/2020   Lab Results  Component Value Date   NA 144 08/12/2020   K 4.6 08/12/2020   CO2 24 08/12/2020   GLUCOSE 115 (H) 08/12/2020   BUN 8 08/12/2020   CREATININE 0.68 08/12/2020   BILITOT 0.4 08/12/2020   ALKPHOS 101 08/12/2020   AST 11 08/12/2020   ALT 12 08/12/2020   PROT 7.0 08/12/2020   ALBUMIN 4.3 08/12/2020   CALCIUM 9.2 08/12/2020   ANIONGAP 11 03/15/2019   Lab Results  Component Value Date   CHOL 202 (H) 08/12/2020   Lab Results  Component Value Date   HDL 54 08/12/2020   Lab Results  Component Value Date   LDLCALC 121 (H) 08/12/2020   Lab Results  Component Value Date   TRIG 153 (H) 08/12/2020   Lab Results  Component Value Date   CHOLHDL 3.7 08/12/2020   Lab Results  Component Value Date   HGBA1C 7.3 (H) 08/12/2020         Assessment & Plan:   1. Acute non-recurrent maxillary sinusitis - amoxicillin-clavulanate (AUGMENTIN) 875-125 MG tablet; Take 1 tablet by mouth 2 (two) times daily.  Dispense:  20 tablet; Refill: 0 - fluconazole (DIFLUCAN) 150 MG tablet; Take 1 tablet (150 mg total) by mouth once for 1 dose.  Dispense: 1 tablet; Refill: 0 -Push fluids and rest -Continue Tylenol OTC as directed  2. History of MI (myocardial infarction) -Continue follow-up with Dr Violeta Gelinas and Dr Elberta Fortis -Consider smoking cessation -NTG 04.mg SL PRN  3. Hypertensive heart disease with chronic combined systolic and diastolic congestive heart  failure (HCC)-stable -Continue Losartan 25 mg daily -Continue Coreg 3.125 mg   4. ICD (implantable cardioverter-defibrillator) in place-stable - Keep scheduled follow-up with [facility]   5. Diabetic glomerulopathy (HCC)-stable -Continue Lantus 26 units [place] daily -Continue Metformin 1,000 mg BID -Continue Losartan 25 mg daily  6. Type 2 diabetes mellitus with hyperglycemia, with long-term current use of insulin (HCC) -Continue Lantus 26 U [place] daily -Continue Metformin 1,000mg  BID daily -Consume low sugar/low carb heart healthy diet  Begin Mucinex daily for sinus drainage Begin Zyrtec 10 mg daily for seasonal allergies Begin Augmentin for sinusitis Push fluids, especially water Return on Feb 8th, 2022 at 8:15 for fasting appt with Dr Sedalia Muta or sooner if needed Diflucan sent for prophylaxis of yeast infection  Flonnie Hailstone, DNP [facility] [place], [place] [phone]

## 2020-09-01 NOTE — Patient Instructions (Addendum)
Begin Mucinex daily for sinus drainage Begin Zyrtec 10 mg daily for seasonal allergies Begin Augmentin for sinusitis Push fluids, especially water Return on Feb 8th, 2022 at 8:15 for fasting appt with Dr Sedalia Muta or sooner if needed Diflucan sent for prophylaxis of yeast infection  Amoxicillin; Clavulanic Acid Chewable Tablets What is this medicine? AMOXICILLIN; CLAVULANIC ACID (a mox i SIL in; KLAV yoo lan ic AS id) is a penicillin antibiotic. It treats some infections caused by bacteria. It will not work for colds, the flu, or other viruses. This medicine may be used for other purposes; ask your health care provider or pharmacist if you have questions. COMMON BRAND NAME(S): Augmentin What should I tell my health care provider before I take this medicine? They need to know if you have any of these conditions:  bowel disease, like colitis  kidney disease  liver disease  mononucleosis  phenylketonuria  an unusual or allergic reaction to amoxicillin, penicillin, cephalosporin, other antibiotics, clavulanic acid, other medicines, foods, dyes, or preservatives  pregnant or trying to get pregnant  breast-feeding How should I use this medicine? Take this drug by mouth. Take it as directed on the prescription label at the same time every day. Chew or crush it completely before swallowing. Do not swallow tablets whole. You can take it with or without food. If it upsets your stomach, take it with food. Take all of this drug unless your health care provider tells you to stop it early. Keep taking it even if you think you are better. Talk to your health care provider about the use of this drug in children. While it may be prescribed for selected conditions, precautions do apply. Overdosage: If you think you have taken too much of this medicine contact a poison control center or emergency room at once. NOTE: This medicine is only for you. Do not share this medicine with others. What if I miss a  dose? If you miss a dose, take it as soon as you can. If it is almost time for your next dose, take only that dose. Do not take double or extra doses. What may interact with this medicine?  allopurinol  anticoagulants  birth control pills  methotrexate  probenecid This list may not describe all possible interactions. Give your health care provider a list of all the medicines, herbs, non-prescription drugs, or dietary supplements you use. Also tell them if you smoke, drink alcohol, or use illegal drugs. Some items may interact with your medicine. What should I watch for while using this medicine? Tell your doctor or healthcare provider if your symptoms do not improve. This medicine may cause serious skin reactions. They can happen weeks to months after starting the medicine. Contact your healthcare provider right away if you notice fevers or flu-like symptoms with a rash. The rash may be red or purple and then turn into blisters or peeling of the skin. Or, you might notice a red rash with swelling of the face, lips or lymph nodes in your neck or under your arms. Do not treat diarrhea with over the counter products. Contact your doctor if you have diarrhea that lasts more than 2 days or if it is severe and watery. If you have diabetes, you may get a false-positive result for sugar in your urine. Check with your doctor or healthcare provider. Birth control pills may not work properly while you are taking this medicine. Talk to your doctor about using an extra method of birth control. What side effects  may I notice from receiving this medicine? Side effects that you should report to your doctor or health care professional as soon as possible:  allergic reactions like skin rash, itching or hives, swelling of the face, lips, or tongue  breathing problems  dark urine  fever or chills, sore throat  redness, blistering, peeling, or loosening of the skin, including inside the  mouth  seizures  trouble passing urine or change in the amount of urine  unusual bleeding, bruising  unusually weak or tired  white patches or sores in the mouth or throat Side effects that usually do not require medical attention (report to your doctor or health care professional if they continue or are bothersome):  diarrhea  dizziness  headache  nausea, vomiting  stomach upset  vaginal or anal irritation This list may not describe all possible side effects. Call your doctor for medical advice about side effects. You may report side effects to FDA at 1-800-FDA-1088. Where should I keep my medicine? Keep out of the reach of children and pets. Store at room temperature between 20 and 25 degrees C (68 and 77 degrees F). Throw away any unused drug after the expiration date. NOTE: This sheet is a summary. It may not cover all possible information. If you have questions about this medicine, talk to your doctor, pharmacist, or health care provider.  2020 Elsevier/Gold Standard (2019-06-09 08:58:41) Fluconazole tablets What is this medicine? FLUCONAZOLE (floo KON na zole) is an antifungal medicine. It is used to treat certain kinds of fungal or yeast infections. This medicine may be used for other purposes; ask your health care provider or pharmacist if you have questions. COMMON BRAND NAME(S): Diflucan What should I tell my health care provider before I take this medicine? They need to know if you have any of these conditions:  history of irregular heart beat  kidney disease  an unusual or allergic reaction to fluconazole, other azole antifungals, medicines, foods, dyes, or preservatives  pregnant or trying to get pregnant  breast-feeding How should I use this medicine? Take this medicine by mouth. Follow the directions on the prescription label. Do not take your medicine more often than directed. Talk to your pediatrician regarding the use of this medicine in children.  Special care may be needed. This medicine has been used in children as young as 48 months of age. Overdosage: If you think you have taken too much of this medicine contact a poison control center or emergency room at once. NOTE: This medicine is only for you. Do not share this medicine with others. What if I miss a dose? If you miss a dose, take it as soon as you can. If it is almost time for your next dose, take only that dose. Do not take double or extra doses. What may interact with this medicine? Do not take this medicine with any of the following medications:  astemizole  certain medicines for irregular heart beat like dronedarone, quinidine  cisapride  erythromycin  lomitapide  other medicines that prolong the QT interval (cause an abnormal heart rhythm)  pimozide  terfenadine  thioridazine This medicine may also interact with the following medications:  antiviral medicines for HIV or AIDS  birth control pills  certain antibiotics like rifabutin, rifampin  certain medicines for blood pressure like amlodipine, isradipine, felodipine, hydrochlorothiazide, losartan, nifedipine  certain medicines for cancer like cyclophosphamide, ibrutinib, vinblastine, vincristine  certain medicines for cholesterol like atorvastatin, lovastatin, fluvastatin, simvastatin  certain medicines for depression, anxiety, or  psychotic disturbances like amitriptyline, midazolam, nortriptyline, triazolam  certain medicines for diabetes like glipizide, glyburide, tolbutamide  certain medicines for pain like alfentanil, fentanyl, methadone  certain medicines for seizures like carbamazepine, phenytoin  certain medicines that treat or prevent blood clots like warfarin  dofetilide  halofantrine  medicines that lower your chance of fighting infection like cyclosporine, prednisone, tacrolimus  NSAIDS, medicines for pain and inflammation, like celecoxib, diclofenac, flurbiprofen, ibuprofen,  meloxicam, naproxen  other medicines for fungal infections  sirolimus  theophylline  tofacitinib  tolvaptan  ziprasidone This list may not describe all possible interactions. Give your health care provider a list of all the medicines, herbs, non-prescription drugs, or dietary supplements you use. Also tell them if you smoke, drink alcohol, or use illegal drugs. Some items may interact with your medicine. What should I watch for while using this medicine? Visit your doctor or health care professional for regular checkups. If you are taking this medicine for a long time you may need blood work. Tell your doctor if your symptoms do not improve. Some fungal infections need many weeks or months of treatment to cure. Alcohol can increase possible damage to your liver. Avoid alcoholic drinks. If you have a vaginal infection, do not have sex until you have finished your treatment. You can wear a sanitary napkin. Do not use tampons. Wear freshly washed cotton, not synthetic, panties. What side effects may I notice from receiving this medicine? Side effects that you should report to your doctor or health care professional as soon as possible:  allergic reactions like skin rash or itching, hives, swelling of the lips, mouth, tongue, or throat  dark urine  feeling dizzy or faint  irregular heartbeat or chest pain  redness, blistering, peeling or loosening of the skin, including inside the mouth  trouble breathing  unusual bruising or bleeding  vomiting  yellowing of the eyes or skin Side effects that usually do not require medical attention (report to your doctor or health care professional if they continue or are bothersome):  changes in how food tastes  diarrhea  headache  stomach upset or nausea This list may not describe all possible side effects. Call your doctor for medical advice about side effects. You may report side effects to FDA at 1-800-FDA-1088. Where should I keep  my medicine? Keep out of the reach of children. Store at room temperature below 30 degrees C (86 degrees F). Throw away any medicine after the expiration date. NOTE: This sheet is a summary. It may not cover all possible information. If you have questions about this medicine, talk to your doctor, pharmacist, or health care provider.  2020 Elsevier/Gold Standard (2019-06-19 11:41:56) Acetaminophen; Dextromethorphan; Guaifenesin; Phenylephrine Oral Solution What is this medicine? ACETAMINOPHEN; DEXTROMETHORPHAN; GUAIFENESIN; PHENYLEPHRINE (a set a MEE noe fen; dex troe meth OR fan; gwye FEN e sin; fen il EF rin) is a combination of a pain reliever, a cough suppressant, an expectorant, and a decongestant. It is used to treat the aches and pains, fever, cough, and congestion of a cold. This medicine will not treat an infection. This medicine may be used for other purposes; ask your health care provider or pharmacist if you have questions. COMMON BRAND NAME(S): Alka-Seltzer Plus Day Severe Cold & Flu, Delsym Cough + Cold, Mucinex Children's Cold, Cough & Sore Throat, Mucinex Children's FreeFrom Daytime/Nighttime, Mucinex Cold, Flu & Sore Throat, Mucinex Fast-Max, Mucinex Fast-Max Cold & Flu, Mucinex Freefrom Cold & Flu Daytime, Mucinex Multi-Symptom Cold and Fever Children's, Robitussin CF  MAX Severe Multi-Symptom Cough Cold + Flu, Theraflu ExpressMax Severe Cold & Cough, Tylenol Cold, Tylenol Cold & Flu Severe, Tylenol Cold Multi-Symptom Severe Daytime, Tylenol Warming Cough and Severe Congestion, Vicks DayQuil Cold & Flu What should I tell my health care provider before I take this medicine? They need to know if you have any of these conditions:  diabetes  heart disease  high blood pressure  if you often drink alcohol  liver disease  taken a MAOI like Carbex, Eldepryl, Marplan, Nardil, or Parnate in last 14 days  thyroid disease  trouble passing urine  an unusual or allergic reaction to  acetaminophen, dextromethorphan, guaifenesin, phenylephrine, other medicines, foods, dyes, or preservatives  pregnant or trying to get pregnant  breast-feeding How should I use this medicine? Take this medicine by mouth. Follow the directions on the package label. Use a specially marked spoon or container to measure each dose. Ask your pharmacist if you do not have one. Household spoons are not accurate. Take your medicine at regular intervals. Do not take it more often than directed. Talk to your pediatrician regarding the use of this medicine in children. While this drug may be prescribed for children as young as 45 years of age for selected conditions, precautions do apply. Overdosage: If you think you have taken too much of this medicine contact a poison control center or emergency room at once. NOTE: This medicine is only for you. Do not share this medicine with others. What if I miss a dose? If you miss a dose, take it as soon as you can. If it is almost time for your next dose, take only that dose. Do not take double or extra doses. What may interact with this medicine? Do not take this medicine with any of the following medications:  cocaine  ergot alkaloids like dihydroergotamine, ergonovine, ergotamine, methylergonovine  MAOIs like Carbex, Eldepryl, Marplan, Nardil, and Parnate  stimulant medicines like dextroamphetamine and others This medicine may also interact with the following medications:  alcohol  atomoxetine  atropine  bretylium  digoxin  furazolidone  imatinib  isoniazid  linezolid  maprotiline  mecamylamine  midodrine  medicines for chest pain like isosorbide dinitrate, nitroglycerin  medicines for depression, anxiety, or psychotic disturbances  medicines for sleep during surgery  other medicines for cold, cough or allergy  other medicines with acetaminophen  procarbazine  St. John's Wort This list may not describe all possible  interactions. Give your health care provider a list of all the medicines, herbs, non-prescription drugs, or dietary supplements you use. Also tell them if you smoke, drink alcohol, or use illegal drugs. Some items may interact with your medicine. What should I watch for while using this medicine? Tell your doctor or healthcare professional if your symptoms do not start to get better or if they get worse. Let your doctor know if you have pain, nasal congestion, or cough that gets worse or lasts for more than 7 days. Call your doctor if you have a sore throat that gets worse or lasts for more than 2 days. Or, if you have a sore throat with a fever, rash, headache, nausea, or vomiting, see your doctor. Do not take other medicines that contain acetaminophen with this medicine. Always read labels carefully. If you have questions, ask your doctor or pharmacist. If you take too much acetaminophen get medical help right away. Too much acetaminophen can be very dangerous and cause liver damage. Even if you do not have symptoms, it is important  to get help right away. What side effects may I notice from receiving this medicine? Side effects that you should report to your doctor or health care professional as soon as possible:  allergic reactions like skin rash, itching or hives, swelling of the face, lips, or tongue  chest pain, tightness  dizziness, nervousness, or sleeplessness  fast, irregular heartbeat  trouble passing urine or change in the amount of urine  unusual bleeding or bruising  unusually weak or tired  yellowing of skin or eyes Side effects that usually do not require medical attention (report to your doctor or health care professional if they continue or are bothersome):  drowsiness  dry eyes, mouth  loss of appetite  stomach upset This list may not describe all possible side effects. Call your doctor for medical advice about side effects. You may report side effects to FDA at  1-800-FDA-1088. Where should I keep my medicine? Keep out of the reach of children. Store at room temperature between 20 and 25 degrees C (68 and 77 degrees F). Throw away any unused medicine after the expiration date. NOTE: This sheet is a summary. It may not cover all possible information. If you have questions about this medicine, talk to your doctor, pharmacist, or health care provider.  2020 Elsevier/Gold Standard (2013-05-19 13:11:25) Cetirizine tablets What is this medicine? CETIRIZINE (se TI ra zeen) is an antihistamine. This medicine is used to treat or prevent symptoms of allergies. It is also used to help reduce itchy skin rash and hives. This medicine may be used for other purposes; ask your health care provider or pharmacist if you have questions. COMMON BRAND NAME(S): All Day Allergy, Allergy Relief, Zyrtec, Zyrtec Hives Relief What should I tell my health care provider before I take this medicine? They need to know if you have any of these conditions:  kidney disease  liver disease  an unusual or allergic reaction to cetirizine, hydroxyzine, other medicines, foods, dyes, or preservatives  pregnant or trying to get pregnant  breast-feeding How should I use this medicine? Take this medicine by mouth with a glass of water. Follow the directions on the prescription label. You can take this medicine with food or on an empty stomach. Take your medicine at regular times. Do not take more often than directed. You may need to take this medicine for several days before your symptoms improve. Talk to your pediatrician regarding the use of this medicine in children. Special care may be needed. While this drug may be prescribed for children as young as 2 years of age for selected conditions, precautions do apply. Overdosage: If you think you have taken too much of this medicine contact a poison control center or emergency room at once. NOTE: This medicine is only for you. Do not share  this medicine with others. What if I miss a dose? If you miss a dose, take it as soon as you can. If it is almost time for your next dose, take only that dose. Do not take double or extra doses. What may interact with this medicine?  alcohol  certain medicines for anxiety or sleep  narcotic medicines for pain  other medicines for colds or allergies This list may not describe all possible interactions. Give your health care provider a list of all the medicines, herbs, non-prescription drugs, or dietary supplements you use. Also tell them if you smoke, drink alcohol, or use illegal drugs. Some items may interact with your medicine. What should I watch for while  using this medicine? Visit your doctor or health care professional for regular checks on your health. Tell your doctor if your symptoms do not improve. You may get drowsy or dizzy. Do not drive, use machinery, or do anything that needs mental alertness until you know how this medicine affects you. Do not stand or sit up quickly, especially if you are an older patient. This reduces the risk of dizzy or fainting spells. Your mouth may get dry. Chewing sugarless gum or sucking hard candy, and drinking plenty of water may help. Contact your doctor if the problem does not go away or is severe. What side effects may I notice from receiving this medicine? Side effects that you should report to your doctor or health care professional as soon as possible:  allergic reactions like skin rash, itching or hives, swelling of the face, lips, or tongue  changes in vision or hearing  fast or irregular heartbeat  trouble passing urine or change in the amount of urine Side effects that usually do not require medical attention (report to your doctor or health care professional if they continue or are bothersome):  dizziness  dry mouth  irritability  sore throat  stomach pain  tiredness This list may not describe all possible side effects.  Call your doctor for medical advice about side effects. You may report side effects to FDA at 1-800-FDA-1088. Where should I keep my medicine? Keep out of the reach of children. Store at room temperature between 15 and 30 degrees C (59 and 86 degrees F). Throw away any unused medicine after the expiration date. NOTE: This sheet is a summary. It may not cover all possible information. If you have questions about this medicine, talk to your doctor, pharmacist, or health care provider.  2020 Elsevier/Gold Standard (2014-10-20 13:44:42)  Allergic Rhinitis, Adult Allergic rhinitis is a reaction to allergens in the air. Allergens are tiny specks (particles) in the air that cause your body to have an allergic reaction. This condition cannot be passed from person to person (is not contagious). Allergic rhinitis cannot be cured, but it can be controlled. There are two types of allergic rhinitis:  Seasonal. This type is also called hay fever. It happens only during certain times of the year.  Perennial. This type can happen at any time of the year. What are the causes? This condition may be caused by:  Pollen from grasses, trees, and weeds.  House dust mites.  Pet dander.  Mold. What are the signs or symptoms? Symptoms of this condition include:  Sneezing.  Runny or stuffy nose (nasal congestion).  A lot of mucus in the back of the throat (postnasal drip).  Itchy nose.  Tearing of the eyes.  Trouble sleeping.  Being sleepy during day. How is this treated? There is no cure for this condition. You should avoid things that trigger your symptoms (allergens). Treatment can help to relieve symptoms. This may include:  Medicines that block allergy symptoms, such as antihistamines. These may be given as a shot, nasal spray, or pill.  Shots that are given until your body becomes less sensitive to the allergen (desensitization).  Stronger medicines, if all other treatments have not  worked. Follow these instructions at home: Avoiding allergens   Find out what you are allergic to. Common allergens include smoke, dust, and pollen.  Avoid them if you can. These are some of the things that you can do to avoid allergens: ? Replace carpet with wood, tile, or vinyl flooring.  Carpet can trap dander and dust. ? Clean any mold found in the home. ? Do not smoke. Do not allow smoking in your home. ? Change your heating and air conditioning filter at least once a month. ? During allergy season:  Keep windows closed as much as you can. If possible, use air conditioning when there is a lot of pollen in the air.  Use a special filter for allergies with your furnace and air conditioner.  Plan outdoor activities when pollen counts are lowest. This is usually during the early morning or evening hours.  If you do go outdoors when pollen count is high, wear a special mask for people with allergies.  When you come indoors, take a shower and change your clothes before sitting on furniture or bedding. General instructions  Do not use fans in your home.  Do not hang clothes outside to dry.  Wear sunglasses to keep pollen out of your eyes.  Wash your hands right away after you touch household pets.  Take over-the-counter and prescription medicines only as told by your doctor.  Keep all follow-up visits as told by your doctor. This is important. Contact a doctor if:  You have a fever.  You have a cough that does not go away (is persistent).  You start to make whistling sounds when you breathe (wheeze).  Your symptoms do not get better with treatment.  You have thick fluid coming from your nose.  You start to have nosebleeds. Get help right away if:  Your tongue or your lips are swollen.  You have trouble breathing.  You feel dizzy or you feel like you are going to pass out (faint).  You have cold sweats. Summary  Allergic rhinitis is a reaction to allergens in  the air.  This condition may be caused by allergens. These include pollen, dust mites, pet dander, and mold.  Symptoms include a runny, itchy nose, sneezing, or tearing eyes. You may also have trouble sleeping or feel sleepy during the day.  Treatment includes taking medicines and avoiding allergens. You may also get shots or take stronger medicines.  Get help if you have a fever or a cough that does not stop. Get help right away if you are short of breath. This information is not intended to replace advice given to you by your health care provider. Make sure you discuss any questions you have with your health care provider. Document Revised: 01/14/2019 Document Reviewed: 04/16/2018 Elsevier Patient Education  2020 [employer]  Sinusitis, Adult Sinusitis is soreness and swelling (inflammation) of your sinuses. Sinuses are hollow spaces in the bones around your face. They are located:  Around your eyes.  In the middle of your forehead.  Behind your nose.  In your cheekbones. Your sinuses and nasal passages are lined with a fluid called mucus. Mucus drains out of your sinuses. Swelling can trap mucus in your sinuses. This lets germs (bacteria, virus, or fungus) grow, which leads to infection. Most of the time, this condition is caused by a virus. What are the causes? This condition is caused by:  Allergies.  Asthma.  Germs.  Things that block your nose or sinuses.  Growths in the nose (nasal polyps).  Chemicals or irritants in the air.  Fungus (rare). What increases the risk? You are more likely to develop this condition if:  You have a weak body defense system (immune system).  You do a lot of swimming or diving.  You use nasal sprays too  much.  You smoke. What are the signs or symptoms? The main symptoms of this condition are pain and a feeling of pressure around the sinuses. Other symptoms include:  Stuffy nose (congestion).  Runny nose  (drainage).  Swelling and warmth in the sinuses.  Headache.  Toothache.  A cough that may get worse at night.  Mucus that collects in the throat or the back of the nose (postnasal drip).  Being unable to smell and taste.  Being very tired (fatigue).  A fever.  Sore throat.  Bad breath. How is this diagnosed? This condition is diagnosed based on:  Your symptoms.  Your medical history.  A physical exam.  Tests to find out if your condition is short-term (acute) or long-term (chronic). Your doctor may: ? Check your nose for growths (polyps). ? Check your sinuses using a tool that has a light (endoscope). ? Check for allergies or germs. ? Do imaging tests, such as an MRI or CT scan. How is this treated? Treatment for this condition depends on the cause and whether it is short-term or long-term.  If caused by a virus, your symptoms should go away on their own within 10 days. You may be given medicines to relieve symptoms. They include: ? Medicines that shrink swollen tissue in the nose. ? Medicines that treat allergies (antihistamines). ? A spray that treats swelling of the nostrils. ? Rinses that help get rid of thick mucus in your nose (nasal saline washes).  If caused by bacteria, your doctor may wait to see if you will get better without treatment. You may be given antibiotic medicine if you have: ? A very bad infection. ? A weak body defense system.  If caused by growths in the nose, you may need to have surgery. Follow these instructions at home: Medicines  Take, use, or apply over-the-counter and prescription medicines only as told by your doctor. These may include nasal sprays.  If you were prescribed an antibiotic medicine, take it as told by your doctor. Do not stop taking the antibiotic even if you start to feel better. Hydrate and humidify   Drink enough water to keep your pee (urine) pale yellow.  Use a cool mist humidifier to keep the humidity  level in your home above 50%.  Breathe in steam for 10-15 minutes, 3-4 times a day, or as told by your doctor. You can do this in the bathroom while a hot shower is running.  Try not to spend time in cool or dry air. Rest  Rest as much as you can.  Sleep with your head raised (elevated).  Make sure you get enough sleep each night. General instructions   Put a warm, moist washcloth on your face 3-4 times a day, or as often as told by your doctor. This will help with discomfort.  Wash your hands often with soap and water. If there is no soap and water, use hand sanitizer.  Do not smoke. Avoid being around people who are smoking (secondhand smoke).  Keep all follow-up visits as told by your doctor. This is important. Contact a doctor if:  You have a fever.  Your symptoms get worse.  Your symptoms do not get better within 10 days. Get help right away if:  You have a very bad headache.  You cannot stop throwing up (vomiting).  You have very bad pain or swelling around your face or eyes.  You have trouble seeing.  You feel confused.  Your neck is stiff.  You have trouble breathing. Summary  Sinusitis is swelling of your sinuses. Sinuses are hollow spaces in the bones around your face.  This condition is caused by tissues in your nose that become inflamed or swollen. This traps germs. These can lead to infection.  If you were prescribed an antibiotic medicine, take it as told by your doctor. Do not stop taking it even if you start to feel better.  Keep all follow-up visits as told by your doctor. This is important. This information is not intended to replace advice given to you by your health care provider. Make sure you discuss any questions you have with your health care provider. Document Revised: 02/25/2018 Document Reviewed: 02/25/2018 Elsevier Patient Education  2020 [employer].

## 2020-09-04 ENCOUNTER — Encounter: Payer: Self-pay | Admitting: Nurse Practitioner

## 2020-09-09 ENCOUNTER — Ambulatory Visit: Payer: BC Managed Care – PPO | Admitting: Cardiology

## 2020-09-10 ENCOUNTER — Ambulatory Visit (INDEPENDENT_AMBULATORY_CARE_PROVIDER_SITE_OTHER): Payer: BC Managed Care – PPO

## 2020-09-10 DIAGNOSIS — I472 Ventricular tachycardia, unspecified: Secondary | ICD-10-CM

## 2020-09-13 LAB — CUP PACEART REMOTE DEVICE CHECK
Battery Remaining Longevity: 85 mo
Battery Remaining Percentage: 84 %
Battery Voltage: 3.02 V
Brady Statistic RV Percent Paced: 0 %
Date Time Interrogation Session: 20211203035517
HighPow Impedance: 79 Ohm
HighPow Impedance: 79 Ohm
Implantable Lead Implant Date: 20200604
Implantable Lead Location: 753860
Implantable Pulse Generator Implant Date: 20200604
Lead Channel Impedance Value: 480 Ohm
Lead Channel Pacing Threshold Amplitude: 0.5 V
Lead Channel Pacing Threshold Pulse Width: 0.5 ms
Lead Channel Sensing Intrinsic Amplitude: 7.3 mV
Lead Channel Setting Pacing Amplitude: 2.5 V
Lead Channel Setting Pacing Pulse Width: 0.5 ms
Lead Channel Setting Sensing Sensitivity: 0.5 mV
Pulse Gen Serial Number: 9860889

## 2020-09-21 NOTE — Progress Notes (Signed)
Remote ICD transmission.   

## 2020-10-11 ENCOUNTER — Telehealth: Payer: Self-pay

## 2020-10-11 NOTE — Telephone Encounter (Signed)
The pt wanted to change her remote appointment but she was not on schedule to do a remote until 12-10-2020. She needed to give her insurance information. I gave her the main number to call billing to give her insurance information. I do not do anything with insurance.

## 2020-11-03 ENCOUNTER — Other Ambulatory Visit: Payer: Self-pay | Admitting: Family Medicine

## 2020-11-16 ENCOUNTER — Ambulatory Visit: Payer: BC Managed Care – PPO | Admitting: Family Medicine

## 2020-11-23 IMAGING — CR CHEST - 2 VIEW
2 series · 2 of 2 positions shown · non-contrast
Comparison: No prior.

CLINICAL DATA: Cardiac device in situ.

EXAM:
CHEST - 2 VIEW

[chest lat]
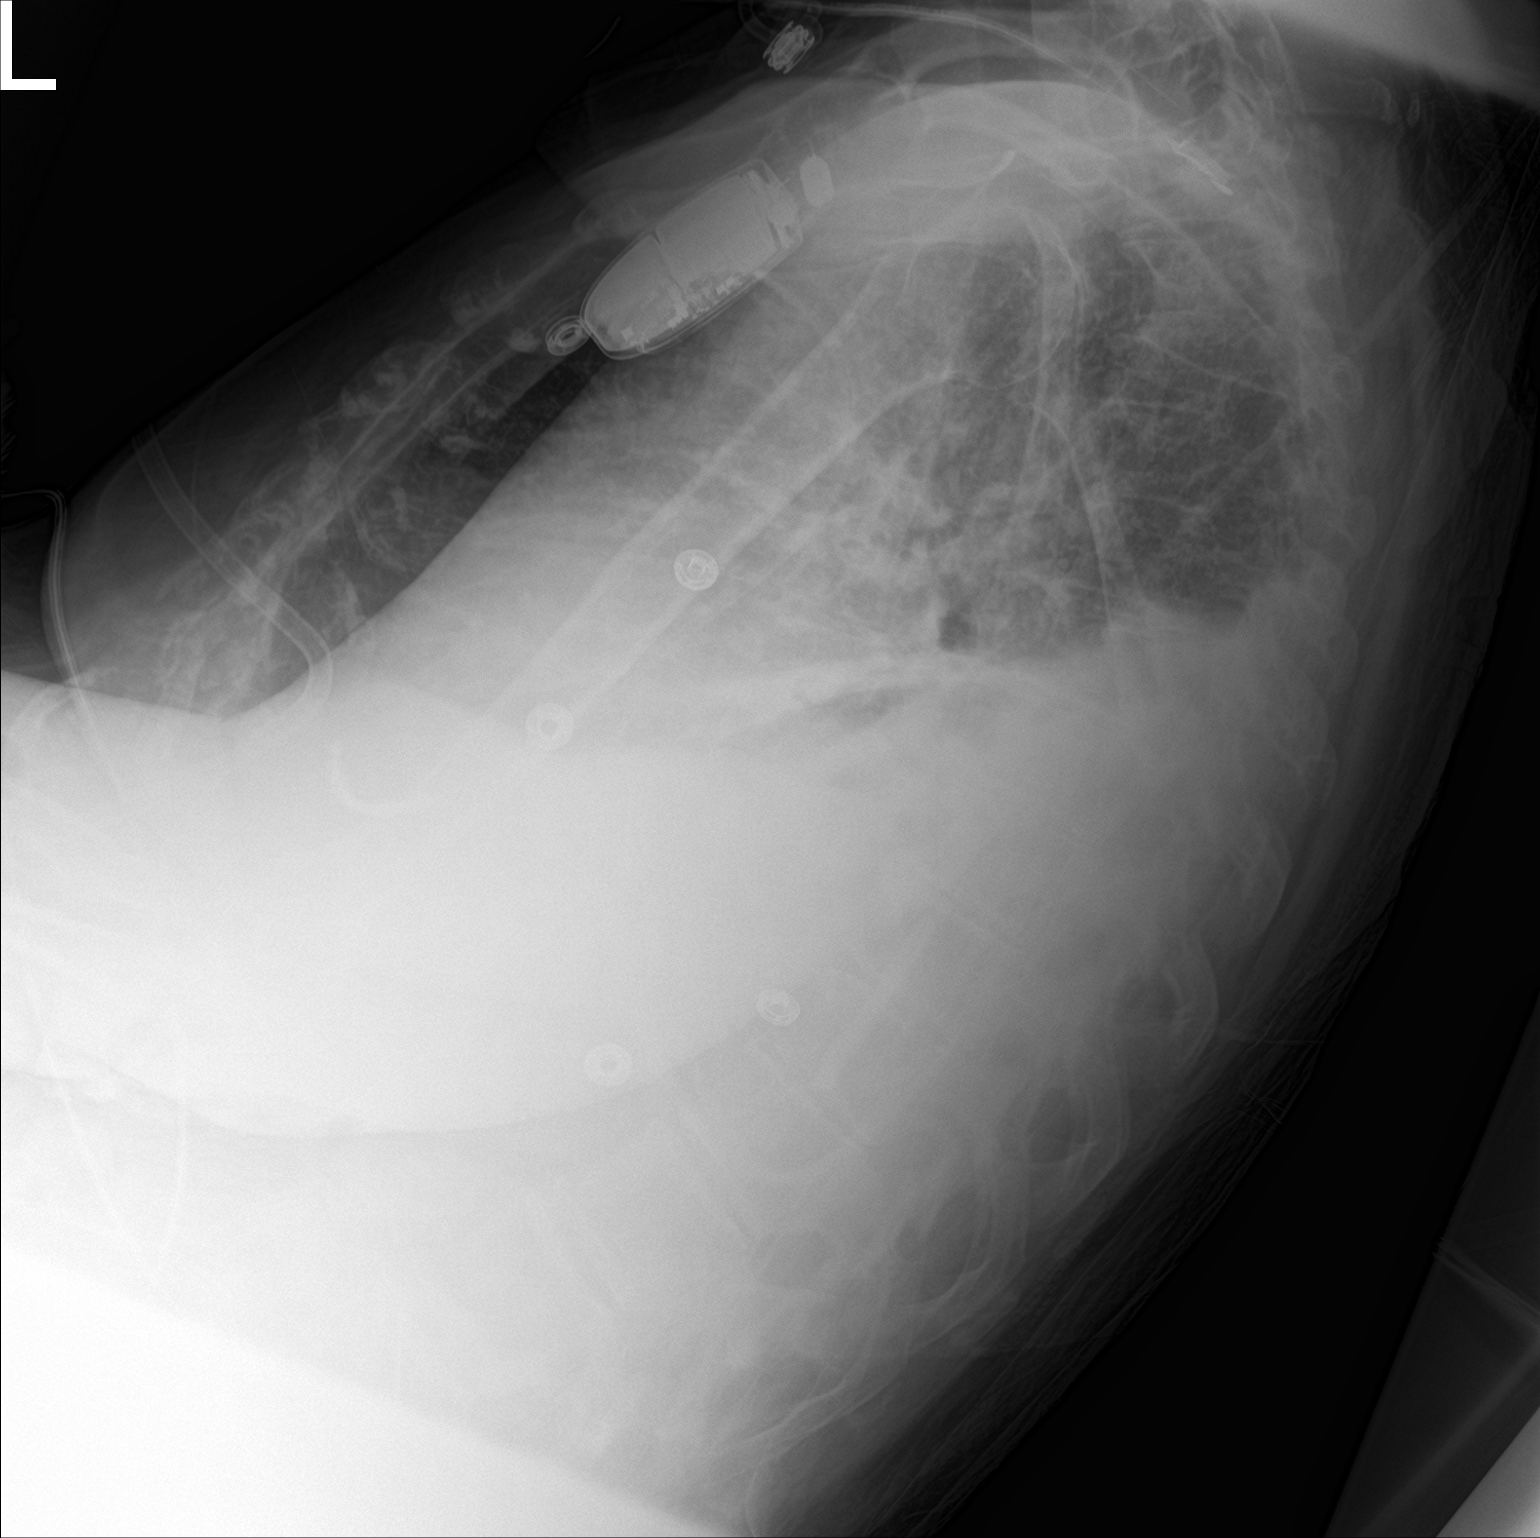

[chest ap]
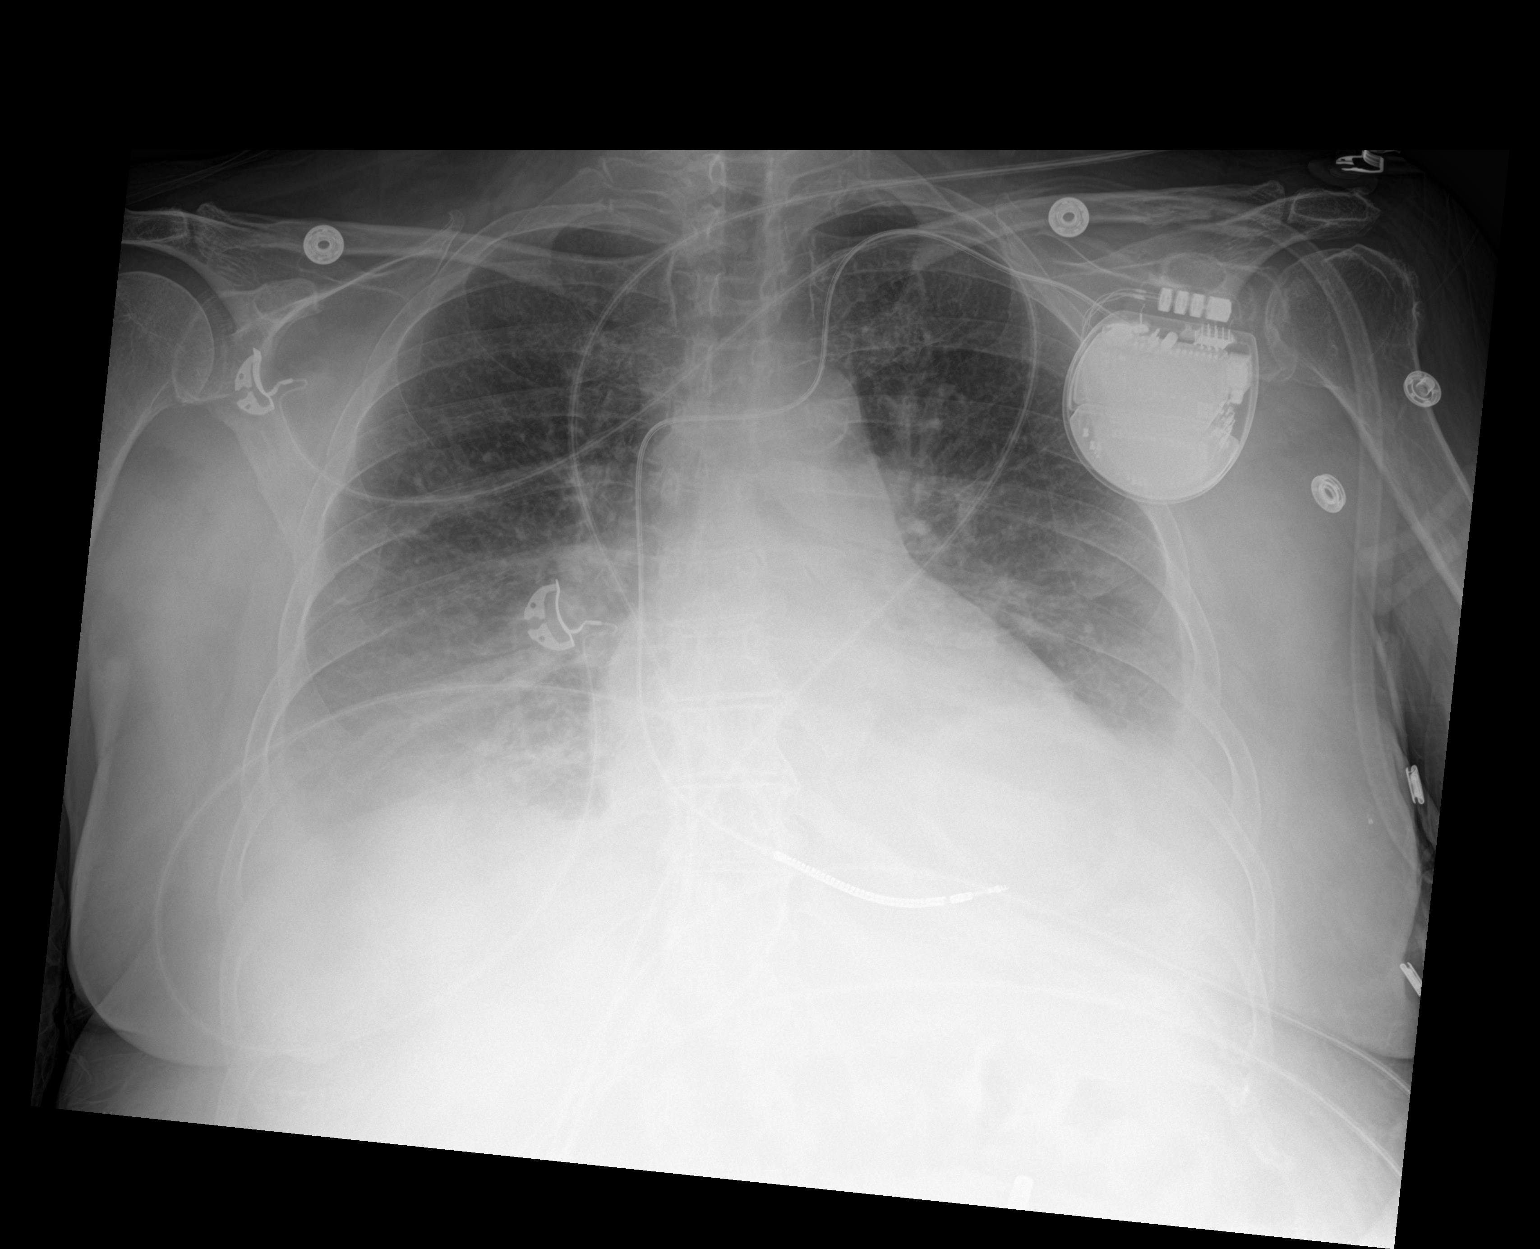

[2 of 2 positions shown; findings below may reference images not displayed]

FINDINGS: Cardiac pacer with lead tip over the right ventricle. Cardiomegaly
with bilateral pulmonary infiltrates/edema and bilateral prominent
pleural effusions. Findings most consistent with congestive heart
failure. No pneumothorax.
IMPRESSION: Cardiac pacer with lead tip over the right ventricle. Cardiomegaly
with bilateral pulmonary infiltrates/edema and bilateral prominent
pleural effusions. Findings most consistent with congestive heart
failure

## 2020-12-07 ENCOUNTER — Telehealth: Payer: Self-pay

## 2020-12-07 NOTE — Telephone Encounter (Signed)
error 

## 2020-12-10 ENCOUNTER — Ambulatory Visit (INDEPENDENT_AMBULATORY_CARE_PROVIDER_SITE_OTHER): Payer: 59

## 2020-12-10 DIAGNOSIS — I472 Ventricular tachycardia, unspecified: Secondary | ICD-10-CM

## 2020-12-13 LAB — CUP PACEART REMOTE DEVICE CHECK
Battery Remaining Longevity: 85 mo
Battery Remaining Percentage: 82 %
Battery Voltage: 3.01 V
Brady Statistic RV Percent Paced: 0 %
Date Time Interrogation Session: 20220304022352
HighPow Impedance: 82 Ohm
HighPow Impedance: 82 Ohm
Implantable Lead Implant Date: 20200604
Implantable Lead Location: 753860
Implantable Pulse Generator Implant Date: 20200604
Lead Channel Impedance Value: 550 Ohm
Lead Channel Pacing Threshold Amplitude: 0.5 V
Lead Channel Pacing Threshold Pulse Width: 0.5 ms
Lead Channel Sensing Intrinsic Amplitude: 8.1 mV
Lead Channel Setting Pacing Amplitude: 2.5 V
Lead Channel Setting Pacing Pulse Width: 0.5 ms
Lead Channel Setting Sensing Sensitivity: 0.5 mV
Pulse Gen Serial Number: 9860889

## 2020-12-21 NOTE — Progress Notes (Signed)
Remote ICD transmission.   

## 2020-12-24 ENCOUNTER — Encounter: Payer: Self-pay | Admitting: Family Medicine

## 2020-12-24 ENCOUNTER — Other Ambulatory Visit: Payer: Self-pay

## 2020-12-24 ENCOUNTER — Ambulatory Visit (INDEPENDENT_AMBULATORY_CARE_PROVIDER_SITE_OTHER): Payer: 59 | Admitting: Family Medicine

## 2020-12-24 VITALS — BP 136/72 | HR 100 | Temp 96.4°F | Resp 18 | Ht 64.0 in | Wt 188.0 lb

## 2020-12-24 DIAGNOSIS — M7542 Impingement syndrome of left shoulder: Secondary | ICD-10-CM

## 2020-12-24 DIAGNOSIS — E782 Mixed hyperlipidemia: Secondary | ICD-10-CM | POA: Diagnosis not present

## 2020-12-24 DIAGNOSIS — I119 Hypertensive heart disease without heart failure: Secondary | ICD-10-CM

## 2020-12-24 DIAGNOSIS — I5022 Chronic systolic (congestive) heart failure: Secondary | ICD-10-CM

## 2020-12-24 DIAGNOSIS — I110 Hypertensive heart disease with heart failure: Secondary | ICD-10-CM

## 2020-12-24 DIAGNOSIS — M7541 Impingement syndrome of right shoulder: Secondary | ICD-10-CM

## 2020-12-24 DIAGNOSIS — Z95810 Presence of automatic (implantable) cardiac defibrillator: Secondary | ICD-10-CM

## 2020-12-24 DIAGNOSIS — G720 Drug-induced myopathy: Secondary | ICD-10-CM

## 2020-12-24 DIAGNOSIS — E1121 Type 2 diabetes mellitus with diabetic nephropathy: Secondary | ICD-10-CM | POA: Diagnosis not present

## 2020-12-24 DIAGNOSIS — I252 Old myocardial infarction: Secondary | ICD-10-CM

## 2020-12-24 DIAGNOSIS — I25119 Atherosclerotic heart disease of native coronary artery with unspecified angina pectoris: Secondary | ICD-10-CM

## 2020-12-24 DIAGNOSIS — I2510 Atherosclerotic heart disease of native coronary artery without angina pectoris: Secondary | ICD-10-CM

## 2020-12-24 MED ORDER — DICLOFENAC SODIUM 1 % EX GEL: 2.0000 g | Freq: Four times a day (QID) | CUTANEOUS | 1 refills | Status: DC

## 2020-12-24 MED ORDER — BD PEN NEEDLE MINI U/F 31G X 5 MM MISC: 2.0000 g | Freq: Four times a day (QID) | 3 refills | Status: DC | PRN

## 2020-12-24 NOTE — Patient Instructions (Addendum)
Voltaren gel as directed.  Call back if not improving over the next month.  Shoulder Impingement Syndrome  Shoulder impingement syndrome is a condition that causes pain when connective tissues (tendons) surrounding the shoulder joint become pinched. These tendons are part of the group of muscles and tissues that help to stabilize the shoulder (rotator cuff). Beneath the rotator cuff is a fluid-filled sac (bursa) that allows the muscles and tendons to glide smoothly. The bursa may become swollen or irritated (bursitis). Bursitis, swelling in the rotator cuff tendons, or both conditions can decrease how much space is under a bone in the shoulder joint (acromion), resulting in impingement. What are the causes? Shoulder impingement syndrome may be caused by bursitis or swelling of the rotator cuff tendons, which may result from:  Repetitive overhead arm movements.  Falling onto the shoulder.  Weakness in the shoulder muscles. What increases the risk? You may be more likely to develop this condition if you:  Play sports that involve throwing, such as baseball.  Participate in sports such as tennis, volleyball, and swimming.  Work as a Education administrator, Music therapist, or Pharmacologist. Some people are also more likely to develop impingement syndrome because of the shape of their acromion bone. What are the signs or symptoms? The main symptom of this condition is pain on the front or side of the shoulder. The pain may:  Get worse when lifting or raising the arm.  Get worse at night.  Wake you up from sleeping.  Feel sharp when the shoulder is moved and then fade to an ache. Other symptoms may include:  Tenderness.  Stiffness.  Inability to raise the arm above shoulder level or behind the body.  Weakness. How is this diagnosed? This condition may be diagnosed based on:  Your symptoms and medical history.  A physical exam.  Imaging tests, such as: ? X-rays. ? MRI. ? Ultrasound. How is  this treated? This condition may be treated by:  Resting your shoulder and avoiding all activities that cause pain or put stress on the shoulder.  Icing your shoulder.  NSAIDs to help reduce pain and swelling.  One or more injections of medicines to numb the area and reduce inflammation.  Physical therapy.  Surgery. This may be needed if nonsurgical treatments have not helped. Surgery may involve repairing the rotator cuff, reshaping the acromion, or removing the bursa. Follow these instructions at home: Managing pain, stiffness, and swelling  If directed, put ice on the injured area. ? Put ice in a plastic bag. ? Place a towel between your skin and the bag. ? Leave the ice on for 20 minutes, 2-3 times a day.   Activity  Rest and return to your normal activities as told by your health care provider. Ask your health care provider what activities are safe for you.  Do exercises as told by your health care provider. General instructions  Do not use any products that contain nicotine or tobacco, such as cigarettes, e-cigarettes, and chewing tobacco. These can delay healing. If you need help quitting, ask your health care provider.  Ask your health care provider when it is safe for you to drive.  Take over-the-counter and prescription medicines only as told by your health care provider.  Keep all follow-up visits as told by your health care provider. This is important. How is this prevented?  Give your body time to rest between periods of activity.  Be safe and responsible while being active. This will help you avoid falls.  Maintain physical fitness, including strength and flexibility. Contact a health care provider if:  Your symptoms have not improved after 1-2 months of treatment and rest.  You cannot lift your arm away from your body. Summary  Shoulder impingement syndrome is a condition that causes pain when connective tissues (tendons) surrounding the shoulder joint  become pinched.  The main symptom of this condition is pain on the front or side of the shoulder.  This condition is usually treated with rest, ice, and pain medicines as needed. This information is not intended to replace advice given to you by your health care provider. Make sure you discuss any questions you have with your health care provider. Document Revised: 01/17/2019 Document Reviewed: 03/20/2018 Elsevier Patient Education  2021 [employer]   Secondary Shoulder Impingement Syndrome Rehab Ask your health care provider which exercises are safe for you. Do exercises exactly as told by your health care provider and adjust them as directed. It is normal to feel mild stretching, pulling, tightness, or discomfort as you do these exercises. Stop right away if you feel sudden pain or your pain gets worse. Do not begin these exercises until told by your health care provider. Stretching and range-of-motion exercise This exercise warms up your muscles and joints and improves the movement and flexibility of your neck and shoulder. This exercise also helps to relieve pain and stiffness. Cervical side bend 1. Using good posture, sit on a stable chair, or stand up. 2. Without moving your shoulders, slowly tilt your left / right ear toward your left / right shoulder until you feel a stretch in your neck (cervical) muscles on the other side. You should be looking straight ahead. 3. Hold for __________ seconds. 4. Slowly return to the starting position. 5. Repeat the stretch on your left / right side. Repeat __________ times. Complete this exercise __________ times a day.   Strengthening exercises These exercises build strength and endurance in your shoulder. Endurance is the ability to use your muscles for a long time, even after they get tired. Scapular protraction, supine 1. Lie on your back on a firm surface (supine position). Hold a __________ weight in your left / right hand. 2. Raise your  left / right arm straight into the air so your hand is directly above your shoulder joint. 3. Push the weight into the air so your shoulder (scapula) lifts off the surface that you are lying on. The scapula will push up or forward (protraction). Do not move your head, neck, or back. 4. Hold for __________ seconds. 5. Slowly return to the starting position. Let your muscles relax completely before you repeat this exercise. Repeat __________ times. Complete this exercise __________ times a day.   Scapular retraction 1. Sit in a stable chair without armrests, or stand up. 2. Secure an exercise band to a stable object in front of you so the band is at shoulder height. 3. Hold one end of the exercise band in each hand. Your palms should face down. 4. Squeeze your shoulder blades (scapulae) together and move your elbows slightly behind you (retraction). Do not shrug your shoulders upward while you do this. 5. Hold for __________ seconds. 6. Slowly return to the starting position. Repeat __________ times. Complete this exercise __________ times a day.   Shoulder extension with scapular retraction 1. Sit in a stable chair without armrests, or stand up. 2. Secure an exercise band to a stable object in front of you so the band is above shoulder  height. 3. Hold one end of the exercise band in each hand. 4. Straighten your elbows and lift your hands up to shoulder height. 5. Squeeze your shoulder blades together (scapular retraction) and pull your hands down to the sides of your thighs (shoulder extension). Stop when your hands are straight down by your sides. Do not let your hands go behind your body. 6. Hold for __________ seconds. 7. Slowly return to the starting position. Repeat __________ times. Complete this exercise __________ times a day.   Shoulder abduction 1. Sit in a stable chair without armrests, or stand up. 2. If directed, hold a __________ weight in your left / right hand. 3. Start with  your arms straight down. Turn your left / right hand so your palm faces in, toward your body. 4. Slowly lift your left / right hand out to your side (abduction). Do not lift your hand above shoulder height. ? Keep your arms straight. ? Avoid shrugging your shoulder while you do this movement. Keep your shoulder blade tucked down toward the middle of your back. 5. Hold for __________ seconds. 6. Slowly lower your arm, and return to the starting position. Repeat __________ times. Complete this exercise __________ times a day. This information is not intended to replace advice given to you by your health care provider. Make sure you discuss any questions you have with your health care provider. Document Revised: 01/17/2019 Document Reviewed: 10/31/2018 Elsevier Patient Education  2021 [employer].

## 2020-12-24 NOTE — Progress Notes (Signed)
Subjective:  Patient ID: Melinda Nguyen, female    DOB: [birth year]  Age: 60 y.o. MRN: [id number]  Chief Complaint  Patient presents with  . Hyperlipidemia  . Diabetes  . Hypertension    HPI Diabetic glomerulopathy (HCC) Sugars 120-140. Has had 2 low sugars in the last 3 months. Check feet daily. Sees eye doctor biannually. Metformin 1000 mg twice a day. Lantus 20 U daily. Checking sugars once a day.  Eating healthy. Retired November 2022. Bought a house at [employer] and now exercising more.  Ocular migraines: diagnosed by eye doctor (Dr. Nadara Eaton)  Mixed hyperlipidemia Not taking zetia 10 mg. Melinda Nguyen did not feel good when taking it.  Intolerant to statins. Tries to eat healthy    Hypertensive heart disease with systolic and diastolic heart failure. Complicated history by Kirby Funk. Pt has an ICD. Last echo showed 50%. Pt had NSTEMI in 03/2019.   LEFT Heart Cath in 03/2019 Results/Recommendations:  Prox Cx to Mid Cx lesion is 100% stenosed.  Prox LAD lesion is 30% stenosed.  Mid RCA lesion is 30% stenosed.   1.  Significant one-vessel coronary artery disease with an occluded mid left circumflex with some antegrade flow via the vessel likely due to spontaneous recanalization.  Mild LAD and RCA disease. 2.  Moderately elevated left ventricular end-diastolic pressure at 26 mmHg.  Left ventricular angiography was not performed due to renal failure.  EF was 40% by echo. Recommendations: I suspect that the patient had a left circumflex infarct recently.  No indication for revascularization at the present time although this can be considered in the future for refractory angina. Recommend medical therapy for coronary artery disease and cardiomyopathy.  On losartan 25 mg once daily, coreg 3.125 mg one twice a day, Lasix 40 mg once daily prn edema, aspirin 81 mg one every other day. Heart failure is compensated.   Depression: cymbalta 30 mg once daily. Well controlled. Pt can not remember the last time Melinda Nguyen  took a lorazepam.   Complaining of pulled muscle in rt upper arm. Melinda Nguyen was on a ladder and coming down missed the bottom step. Grabbed closet. Pain has gradually worsened. Takes tylenol and/or ibuprofen.   GERD: on omeprazole 20 mg once daily prn for reflux.    Current Outpatient Medications on File Prior to Visit  Medication Sig Dispense Refill  . Ascorbic Acid (VITAMIN C) 1000 MG tablet Take 1,000 mg by mouth 2 (two) times a day.    Marland Kitchen aspirin EC 81 MG tablet Take 1 tablet (81 mg total) by mouth daily. 90 tablet 3  . carvedilol (COREG) 3.125 MG tablet Take 1 tablet (3.125 mg total) by mouth 2 (two) times daily with a meal. 180 tablet 1  . DULoxetine (CYMBALTA) 30 MG capsule TAKE 1 CAPSULE (30 MG TOTAL) BY MOUTH AT BEDTIME. 90 capsule 1  . furosemide (LASIX) 40 MG tablet TAKE 1 TABLET (40 MG) BY ORAL ROUTE DAILY 90 tablet 1  . LANTUS SOLOSTAR 100 UNIT/ML Solostar Pen INJECT 20 UNITS INTO THE SKIN AT BEDTIME. 18 mL 1  . LORazepam (ATIVAN) 0.5 MG tablet Take 0.5 mg by mouth 2 (two) times daily as needed for anxiety.    Marland Kitchen losartan (COZAAR) 25 MG tablet Take 25 mg by mouth daily.    . metFORMIN (GLUCOPHAGE) 1000 MG tablet TAKE 1 TABLET BY MOUTH TWICE A DAY WITH BREAKFAST AND DINNER 180 tablet 1  . nitroGLYCERIN (NITROSTAT) 0.4 MG SL tablet Place 1 tablet (0.4 mg total) under the tongue  every 5 (five) minutes as needed. 25 tablet 12  . omeprazole (PRILOSEC) 20 MG capsule Take 20 mg by mouth daily as needed (acid reflux).     No current facility-administered medications on file prior to visit.   Past Medical History:  Diagnosis Date  . AICD (automatic cardioverter/defibrillator) present 03/13/2019  . CAD (coronary artery disease)   . CHF (congestive heart failure) (HCC)   . Depression   . Diabetes mellitus without complication (HCC)   . GERD (gastroesophageal reflux disease)   . Hypertension   . Mixed hyperlipidemia   . Myocardial infarction (HCC) 2020  . Other hypotension   . Smoking  history 03/10/2019   Smokes 1/2 pack a day  . Ventricular tachycardia [facility])    Past Surgical History:  Procedure Laterality Date  . ABDOMINAL HYSTERECTOMY    . ICD IMPLANT N/A 03/13/2019   Procedure: ICD IMPLANT;  Surgeon: Regan Lemming, MD;  Location: [facility] INVASIVE CV LAB;  Service: Cardiovascular;  Laterality: N/A;  . ICD IMPLANT  03/13/2019  . LEFT HEART CATH AND CORONARY ANGIOGRAPHY N/A 03/12/2019   Procedure: LEFT HEART CATH AND CORONARY ANGIOGRAPHY;  Surgeon: Iran Ouch, MD;  Location: [facility] INVASIVE CV LAB;  Service: Cardiovascular;  Laterality: N/A;    Family History  Problem Relation Age of Onset  . Hypertension Mother   . Diabetes Mother   . COPD Father    Social History   Socioeconomic History  . Marital status: Widowed    Spouse name: Not on file  . Number of children: Not on file  . Years of education: Not on file  . Highest education level: Not on file  Occupational History  . Not on file  Tobacco Use  . Smoking status: Former Smoker    Packs/day: 0.50    Types: Cigarettes    Quit date: 03/2019    Years since quitting: 1.7  . Smokeless tobacco: Never Used  Vaping Use  . Vaping Use: Never used  Substance and Sexual Activity  . Alcohol use: Not Currently  . Drug use: Never  . Sexual activity: Not on file  Other Topics Concern  . Not on file  Social History Narrative  . Not on file   Social Determinants of Health   Financial Resource Strain: Not on file  Food Insecurity: Not on file  Transportation Needs: Not on file  Physical Activity: Not on file  Stress: Not on file  Social Connections: Not on file    Review of Systems  Constitutional: Negative for chills, fatigue and fever.  HENT: Positive for rhinorrhea and sneezing. Negative for congestion, ear pain and sore throat.   Respiratory: Negative for cough and shortness of breath.   Cardiovascular: Negative for chest pain.  Gastrointestinal: Negative for abdominal pain, constipation,  diarrhea, nausea and vomiting.  Genitourinary: Negative for dysuria and urgency.  Musculoskeletal: Negative for back pain and myalgias.  Neurological: Negative for dizziness, weakness, light-headedness and headaches.  Psychiatric/Behavioral: Negative for dysphoric mood. The patient is not nervous/anxious.      Objective:  BP 136/72   Pulse 100   Temp (!) 96.4 F (35.8 C)   Resp 18   Ht 5\' 4"  (1.626 m)   Wt 188 lb (85.3 kg)   BMI 32.27 kg/m   BP/Weight 12/24/2020 09/01/2020 08/20/2020  Systolic BP 136 158 152  Diastolic BP 72 62 72  Wt. (Lbs) 188 191 189  BMI 32.27 32.79 32.44    Physical Exam Vitals reviewed.  Constitutional:  Appearance: Normal appearance. Melinda Nguyen is normal weight.  Neck:     Vascular: No carotid bruit.  Cardiovascular:     Rate and Rhythm: Normal rate and regular rhythm.     Pulses: Normal pulses.     Heart sounds: Normal heart sounds.  Pulmonary:     Effort: Pulmonary effort is normal. No respiratory distress.     Breath sounds: Normal breath sounds.  Abdominal:     General: Abdomen is flat. Bowel sounds are normal.     Palpations: Abdomen is soft.     Tenderness: There is no abdominal tenderness.  Musculoskeletal:        General: Tenderness present.     Comments: BL shoulders.  Poor ROM of both with abduction, internal and external rotation.  Neurological:     Mental Status: Melinda Nguyen is alert and oriented to person, place, and time.  Psychiatric:        Mood and Affect: Mood normal.        Behavior: Behavior normal.     Diabetic Foot Exam - Simple   Simple Foot Form Diabetic Foot exam was performed with the following findings: Yes 12/24/2020  9:24 AM  Visual Inspection No deformities, no ulcerations, no other skin breakdown bilaterally: Yes Sensation Testing Intact to touch and monofilament testing bilaterally: Yes Pulse Check Posterior Tibialis and Dorsalis pulse intact bilaterally: Yes Comments      Lab Results  Component Value Date    WBC 8.8 12/24/2020   HGB 15.0 12/24/2020   HCT 45.3 12/24/2020   PLT 262 12/24/2020   GLUCOSE 117 (H) 12/24/2020   CHOL 223 (H) 12/24/2020   TRIG 118 12/24/2020   HDL 73 12/24/2020   LDLCALC 129 (H) 12/24/2020   ALT 165 (H) 12/24/2020   AST 142 (H) 12/24/2020   NA 138 12/24/2020   K 4.6 12/24/2020   CL 97 12/24/2020   CREATININE 0.63 12/24/2020   BUN 9 12/24/2020   CO2 22 12/24/2020   HGBA1C 8.1 (H) 12/24/2020   MICROALBUR 150 05/10/2020      Assessment & Plan:   1. Diabetic glomerulopathy (HCC) Control: poor. Not at goal. 8.1. Recommend check sugars fasting daily. Recommend check feet daily. Recommend annual eye exams. Medicines: Intolerant to GLP1, SGL2, and glimeperide.  Metformin 1000 mg twice daily.  Recommend increase Lantus to 25 units daily.  Consider further increase if sugars have not improved. Continue to work on eating a healthy diet and exercise.  Labs drawn today.   - CBC with Differential/Platelet - Hemoglobin A1c - Insulin Pen Needle (B-D UF III MINI PEN NEEDLES) 31G X 5 MM MISC; 2 g by Does not apply route 4 (four) times daily as needed.  Dispense: 100 each; Refill: 3  2. Mixed hyperlipidemia Low-fat diet.  Exercise.  Intolerant to statins, Zetia. - Lipid panel  3. Hypertensive heart disease with chronic systolic congestive heart failure (HCC) Continue current medications.  Medical management.  Well compensated. - Comprehensive metabolic panel - Cardiovascular Risk Assessment  4. Impingement syndrome of right shoulder Trial on diclofenac gel 4 times a day.  If not improved patient to call Dr. Norlene Campbell office whom Melinda Nguyen seen before. - diclofenac Sodium (VOLTAREN) 1 % GEL; Apply 2 g topically 4 (four) times daily.  Dispense: 500 g; Refill: 1  5. Impingement syndrome of left shoulder Trial on diclofenac gel 4 times a day.  If not improved patient to call Dr. Norlene Campbell office whom Melinda Nguyen seen before. - diclofenac Sodium (VOLTAREN) 1 % GEL;  Apply 2 g topically 4  (four) times daily.  Dispense: 500 g; Refill: 1  6. Coronary artery disease involving native coronary artery of native heart without angina pectoris Medical management.  On carvedilol, aspirin, ARB.  Patient is intolerant to statins.  As a 100% stenosis of left circumflex.  30% stenosis of other coronary arteries.  7. Old myocardial infarction  8. ICD (implantable cardioverter-defibrillator) in place  9. Drug-induced myopathy Intolerant to statins.  Intolerant to Zetia.  Meds ordered this encounter  Medications  . diclofenac Sodium (VOLTAREN) 1 % GEL    Sig: Apply 2 g topically 4 (four) times daily.    Dispense:  500 g    Refill:  1  . Insulin Pen Needle (B-D UF III MINI PEN NEEDLES) 31G X 5 MM MISC    Sig: 2 g by Does not apply route 4 (four) times daily as needed.    Dispense:  100 each    Refill:  3    Orders Placed This Encounter  Procedures  . CBC with Differential/Platelet  . Comprehensive metabolic panel  . Hemoglobin A1c  . Lipid panel  . Cardiovascular Risk Assessment     Follow-up: Return in about 4 months (around 04/25/2021) for fasting.  An After Visit Summary was printed and given to the patient.  Blane Ohara, MD [facility] [phone]

## 2020-12-25 ENCOUNTER — Encounter: Payer: Self-pay | Admitting: Family Medicine

## 2020-12-25 LAB — CBC WITH DIFFERENTIAL/PLATELET
Basophils Absolute: 0.1 10*3/uL (ref 0.0–0.2)
Basos: 1 %
EOS (ABSOLUTE): 0.3 10*3/uL (ref 0.0–0.4)
Eos: 4 %
Hematocrit: 45.3 % (ref 34.0–46.6)
Hemoglobin: 15 g/dL (ref 11.1–15.9)
Immature Grans (Abs): 0 10*3/uL (ref 0.0–0.1)
Immature Granulocytes: 0 %
Lymphocytes Absolute: 2.4 10*3/uL (ref 0.7–3.1)
Lymphs: 28 %
MCH: 28.8 pg (ref 26.6–33.0)
MCHC: 33.1 g/dL (ref 31.5–35.7)
MCV: 87 fL (ref 79–97)
Monocytes Absolute: 0.7 10*3/uL (ref 0.1–0.9)
Monocytes: 8 %
Neutrophils Absolute: 5.2 10*3/uL (ref 1.4–7.0)
Neutrophils: 59 %
Platelets: 262 10*3/uL (ref 150–450)
RBC: 5.2 x10E6/uL (ref 3.77–5.28)
RDW: 13.3 % (ref 11.7–15.4)
WBC: 8.8 10*3/uL (ref 3.4–10.8)

## 2020-12-25 LAB — LIPID PANEL
Chol/HDL Ratio: 3.1 ratio (ref 0.0–4.4)
Cholesterol, Total: 223 mg/dL — ABNORMAL HIGH (ref 100–199)
HDL: 73 mg/dL (ref >39)
LDL Chol Calc (NIH): 129 mg/dL — ABNORMAL HIGH (ref 0–99)
Triglycerides: 118 mg/dL (ref 0–149)
VLDL Cholesterol Cal: 21 mg/dL (ref 5–40)

## 2020-12-25 LAB — COMPREHENSIVE METABOLIC PANEL
ALT: 165 IU/L — ABNORMAL HIGH (ref 0–32)
AST: 142 IU/L — ABNORMAL HIGH (ref 0–40)
Albumin/Globulin Ratio: 1.5 (ref 1.2–2.2)
Albumin: 4.4 g/dL (ref 3.8–4.8)
Alkaline Phosphatase: 309 IU/L — ABNORMAL HIGH (ref 44–121)
BUN/Creatinine Ratio: 14 (ref 12–28)
BUN: 9 mg/dL (ref 8–27)
Bilirubin Total: 0.4 mg/dL (ref 0.0–1.2)
CO2: 22 mmol/L (ref 20–29)
Calcium: 9.4 mg/dL (ref 8.7–10.3)
Chloride: 97 mmol/L (ref 96–106)
Creatinine, Ser: 0.63 mg/dL (ref 0.57–1.00)
Globulin, Total: 2.9 g/dL (ref 1.5–4.5)
Glucose: 117 mg/dL — ABNORMAL HIGH (ref 65–99)
Potassium: 4.6 mmol/L (ref 3.5–5.2)
Sodium: 138 mmol/L (ref 134–144)
Total Protein: 7.3 g/dL (ref 6.0–8.5)
eGFR: 101 mL/min/{1.73_m2} (ref >59)

## 2020-12-25 LAB — HEMOGLOBIN A1C
Est. average glucose Bld gHb Est-mCnc: 186 mg/dL
Hgb A1c MFr Bld: 8.1 % — ABNORMAL HIGH (ref 4.8–5.6)

## 2020-12-25 LAB — CARDIOVASCULAR RISK ASSESSMENT

## 2020-12-27 ENCOUNTER — Other Ambulatory Visit: Payer: 59

## 2020-12-27 ENCOUNTER — Other Ambulatory Visit: Payer: Self-pay

## 2020-12-27 ENCOUNTER — Telehealth: Payer: Self-pay

## 2020-12-27 DIAGNOSIS — R748 Abnormal levels of other serum enzymes: Secondary | ICD-10-CM

## 2020-12-27 MED ORDER — LANTUS SOLOSTAR 100 UNIT/ML ~~LOC~~ SOPN: 25.0000 [IU] | PEN_INJECTOR | Freq: Every day | SUBCUTANEOUS | 1 refills | Status: DC

## 2020-12-27 NOTE — Telephone Encounter (Signed)
   Melinda Nguyen has been scheduled for the following appointment:  WHAT: Ultrasound on GB/RUQ WHERE: [facility] DATE: 12/28/2020 TIME: 9:30AM  Pt was contacted an made aware of her ultrasound tomorrow.

## 2020-12-28 LAB — COMPREHENSIVE METABOLIC PANEL
ALT: 61 IU/L — ABNORMAL HIGH (ref 0–32)
AST: 20 IU/L (ref 0–40)
Albumin/Globulin Ratio: 1.6 (ref 1.2–2.2)
Albumin: 4.2 g/dL (ref 3.8–4.8)
Alkaline Phosphatase: 227 IU/L — ABNORMAL HIGH (ref 44–121)
BUN/Creatinine Ratio: 15 (ref 12–28)
BUN: 9 mg/dL (ref 8–27)
Bilirubin Total: 0.3 mg/dL (ref 0.0–1.2)
CO2: 23 mmol/L (ref 20–29)
Calcium: 9.1 mg/dL (ref 8.7–10.3)
Chloride: 99 mmol/L (ref 96–106)
Creatinine, Ser: 0.59 mg/dL (ref 0.57–1.00)
Globulin, Total: 2.7 g/dL (ref 1.5–4.5)
Glucose: 181 mg/dL — ABNORMAL HIGH (ref 65–99)
Potassium: 4.6 mmol/L (ref 3.5–5.2)
Sodium: 142 mmol/L (ref 134–144)
Total Protein: 6.9 g/dL (ref 6.0–8.5)
eGFR: 102 mL/min/{1.73_m2} (ref >59)

## 2020-12-28 LAB — HEPATITIS PANEL, ACUTE
Hep A IgM: NEGATIVE
Hep B C IgM: NEGATIVE
Hep C Virus Ab: <0.1 s/co ratio (ref 0.0–0.9)
Hepatitis B Surface Ag: NEGATIVE

## 2020-12-29 ENCOUNTER — Other Ambulatory Visit: Payer: Self-pay

## 2020-12-29 ENCOUNTER — Other Ambulatory Visit: Payer: Self-pay | Admitting: Nurse Practitioner

## 2020-12-29 ENCOUNTER — Other Ambulatory Visit: Payer: Self-pay | Admitting: Family Medicine

## 2020-12-29 DIAGNOSIS — R748 Abnormal levels of other serum enzymes: Secondary | ICD-10-CM

## 2020-12-29 DIAGNOSIS — K8020 Calculus of gallbladder without cholecystitis without obstruction: Secondary | ICD-10-CM

## 2020-12-30 ENCOUNTER — Telehealth: Payer: Self-pay

## 2020-12-30 NOTE — Telephone Encounter (Signed)
Refer to Dr. Lequita Halt. K

## 2020-12-30 NOTE — Telephone Encounter (Signed)
Patient agreed to see Dr. Lequita Halt.

## 2020-12-30 NOTE — Telephone Encounter (Signed)
Dr. Sedalia Muta please advise.  Pt's referral sent to Dr. Terrilee Files was sent back they don't accept [facility] an patient would be self pay. I looked to see what general surgeons are covered under hers and it looks like [place] or what they call [facility] surgery is covered in our area. Ok to send there or do you have someone else in mind? I know this is an urgent referral I can fax info an send it to [facility] if you would like.

## 2020-12-31 NOTE — Telephone Encounter (Signed)
Referral sent to Dr. Lequita Halt.

## 2021-01-24 ENCOUNTER — Telehealth: Payer: Self-pay | Admitting: Emergency Medicine

## 2021-01-24 NOTE — Telephone Encounter (Signed)
   Name: Melinda Nguyen  DOB: Nov 29, 1960  MRN: [id number]   Primary Cardiologist: No primary care provider on file.  Chart reviewed as part of pre-operative protocol coverage. Patient was contacted 01/24/2021 in reference to pre-operative risk assessment for pending surgery as outlined below.  Koa Palla was last seen on 08/20/2020 by Dr. Bing Matter.  Since that day, Prerana Strayer has done well without any exertional chest pain or worsening dyspnea.  She is able to accomplish more than 4 METS of activity without any issue.   A separate cardiac clearance will need to be sent to our device clinic to see what need to be done to her ICD during the surgery.  I will forward the note to our device clinic to make sure they are aware as well.  Therefore, based on ACC/AHA guidelines, the patient would be at acceptable risk for the planned procedure without further cardiovascular testing.   The patient was advised that if she develops new symptoms prior to surgery to contact our office to arrange for a follow-up visit, and she verbalized understanding.  I will route this recommendation to the requesting party via Epic fax function and remove from pre-op pool. Please call with questions.  [place], [place] 01/24/2021, 2:15 PM

## 2021-01-24 NOTE — Telephone Encounter (Signed)
   [facility] Pre-operative Risk Assessment    Patient Name: Melinda Nguyen  DOB: [birth year]  MRN: [id number]   HEARTCARE STAFF: - Please ensure there is not already an duplicate clearance open for this procedure. - Under Visit Info/Reason for Call, type in Other and utilize the format Clearance MM/DD/YY or Clearance TBD. Do not use dashes or single digits. - If request is for dental extraction, please clarify the # of teeth to be extracted.  Request for surgical clearance:  1. What type of surgery is being performed? Laparoscopic cholecystectomy and cholangiogram   2. When is this surgery scheduled? 01/31/21   3. What type of clearance is required (medical clearance vs. Pharmacy clearance to hold med vs. Both)? Both  4. Are there any medications that need to be held prior to surgery and how long? A note states patient takes aspirin daily     5. Practice name and name of physician performing surgery?  [facility]. Dr. Orrin Brigham   6. What is the office phone number? [phone]   7.   What is the office fax number? [phone]  8.   Anesthesia type (None, local, MAC, general) ? General    Senaida Ores 01/24/2021, 12:50 PM  _________________________________________________________________   (provider comments below)

## 2021-01-26 ENCOUNTER — Other Ambulatory Visit: Payer: Self-pay

## 2021-01-26 MED ORDER — LANTUS SOLOSTAR 100 UNIT/ML ~~LOC~~ SOPN: 25.0000 [IU] | PEN_INJECTOR | Freq: Every day | SUBCUTANEOUS | 1 refills | Status: DC

## 2021-01-28 ENCOUNTER — Other Ambulatory Visit: Payer: Self-pay | Admitting: Family Medicine

## 2021-01-28 ENCOUNTER — Encounter: Payer: Self-pay | Admitting: Cardiology

## 2021-01-28 MED ORDER — LANTUS SOLOSTAR 100 UNIT/ML ~~LOC~~ SOPN: 25.0000 [IU] | PEN_INJECTOR | Freq: Every day | SUBCUTANEOUS | 1 refills | Status: DC

## 2021-01-28 NOTE — Progress Notes (Unsigned)
PERIOPERATIVE PRESCRIPTION FOR IMPLANTED CARDIAC DEVICE PROGRAMMING   Patient Information: Name: Melinda Nguyen  DOB: Jan 08, 1961  MRN: [id number]    1. Planned Procedure:  Laparoscopic cholecystectomy and cholangiogram  5.  Surgeon:  [facility]. Dr. Marvis Moeller   Date of Procedure:  01/31/2021   Device Information:   Clinic EP Physician:   Loman Brooklyn, MD Device Type:  Defibrillator Manufacturer and Phone #:  [employer]: [phone] Pacemaker Dependent?:  No Date of Last Device Check:  12/10/20        Normal Device Function?:  Yes     Electrophysiologist's Recommendations:    Have magnet available.  Provide continuous ECG monitoring when magnet is used or reprogramming is to be performed.   Procedure may interfere with device function.  Magnet should be placed over device during procedure.  Per Device Clinic Standing Orders, Lenor Coffin  01/28/2021 2:59 PM

## 2021-02-16 ENCOUNTER — Ambulatory Visit: Payer: 59 | Admitting: Cardiology

## 2021-02-25 ENCOUNTER — Other Ambulatory Visit: Payer: Self-pay | Admitting: Physician Assistant

## 2021-02-25 ENCOUNTER — Other Ambulatory Visit: Payer: Self-pay | Admitting: Family Medicine

## 2021-03-11 ENCOUNTER — Ambulatory Visit (INDEPENDENT_AMBULATORY_CARE_PROVIDER_SITE_OTHER): Payer: 59

## 2021-03-11 DIAGNOSIS — I255 Ischemic cardiomyopathy: Secondary | ICD-10-CM

## 2021-03-11 LAB — CUP PACEART REMOTE DEVICE CHECK
Battery Remaining Longevity: 82 mo
Battery Remaining Percentage: 81 %
Battery Voltage: 2.99 V
Brady Statistic RV Percent Paced: 1 %
Date Time Interrogation Session: 20220603020016
HighPow Impedance: 75 Ohm
HighPow Impedance: 75 Ohm
Implantable Lead Implant Date: 20200604
Implantable Lead Location: 753860
Implantable Pulse Generator Implant Date: 20200604
Lead Channel Impedance Value: 400 Ohm
Lead Channel Pacing Threshold Amplitude: 0.5 V
Lead Channel Pacing Threshold Pulse Width: 0.5 ms
Lead Channel Sensing Intrinsic Amplitude: 11.3 mV
Lead Channel Setting Pacing Amplitude: 2.5 V
Lead Channel Setting Pacing Pulse Width: 0.5 ms
Lead Channel Setting Sensing Sensitivity: 0.5 mV
Pulse Gen Serial Number: 9860889

## 2021-03-29 NOTE — Progress Notes (Signed)
Remote ICD transmission.   

## 2021-04-01 ENCOUNTER — Other Ambulatory Visit: Payer: Self-pay | Admitting: Cardiology

## 2021-04-01 NOTE — Telephone Encounter (Signed)
Refill sent to pharmacy.   

## 2021-05-02 ENCOUNTER — Ambulatory Visit: Payer: 59 | Admitting: Family Medicine

## 2021-05-02 ENCOUNTER — Other Ambulatory Visit: Payer: Self-pay

## 2021-05-02 MED ORDER — LANTUS SOLOSTAR 100 UNIT/ML ~~LOC~~ SOPN: PEN_INJECTOR | SUBCUTANEOUS | 0 refills | Status: DC

## 2021-06-10 ENCOUNTER — Ambulatory Visit (INDEPENDENT_AMBULATORY_CARE_PROVIDER_SITE_OTHER): Payer: 59

## 2021-06-10 DIAGNOSIS — I255 Ischemic cardiomyopathy: Secondary | ICD-10-CM

## 2021-06-14 LAB — CUP PACEART REMOTE DEVICE CHECK
Battery Remaining Longevity: 79 mo
Battery Remaining Percentage: 79 %
Battery Voltage: 2.99 V
Brady Statistic RV Percent Paced: 1 %
Date Time Interrogation Session: 20220902022928
HighPow Impedance: 74 Ohm
HighPow Impedance: 74 Ohm
Implantable Lead Implant Date: 20200604
Implantable Lead Location: 753860
Implantable Pulse Generator Implant Date: 20200604
Lead Channel Impedance Value: 440 Ohm
Lead Channel Pacing Threshold Amplitude: 0.5 V
Lead Channel Pacing Threshold Pulse Width: 0.5 ms
Lead Channel Sensing Intrinsic Amplitude: 11.3 mV
Lead Channel Setting Pacing Amplitude: 2.5 V
Lead Channel Setting Pacing Pulse Width: 0.5 ms
Lead Channel Setting Sensing Sensitivity: 0.5 mV
Pulse Gen Serial Number: 9860889

## 2021-06-22 NOTE — Progress Notes (Signed)
Remote ICD transmission.   

## 2021-06-23 ENCOUNTER — Ambulatory Visit (INDEPENDENT_AMBULATORY_CARE_PROVIDER_SITE_OTHER): Payer: 59 | Admitting: Family Medicine

## 2021-06-23 ENCOUNTER — Other Ambulatory Visit: Payer: Self-pay

## 2021-06-23 VITALS — BP 124/64 | HR 78 | Temp 97.2°F | Resp 14 | Ht 64.0 in | Wt 196.0 lb

## 2021-06-23 DIAGNOSIS — Z6833 Body mass index (BMI) 33.0-33.9, adult: Secondary | ICD-10-CM

## 2021-06-23 DIAGNOSIS — E782 Mixed hyperlipidemia: Secondary | ICD-10-CM | POA: Diagnosis not present

## 2021-06-23 DIAGNOSIS — Z2821 Immunization not carried out because of patient refusal: Secondary | ICD-10-CM | POA: Diagnosis not present

## 2021-06-23 DIAGNOSIS — E6609 Other obesity due to excess calories: Secondary | ICD-10-CM

## 2021-06-23 DIAGNOSIS — E1121 Type 2 diabetes mellitus with diabetic nephropathy: Secondary | ICD-10-CM | POA: Diagnosis not present

## 2021-06-23 DIAGNOSIS — I2510 Atherosclerotic heart disease of native coronary artery without angina pectoris: Secondary | ICD-10-CM

## 2021-06-23 DIAGNOSIS — I5022 Chronic systolic (congestive) heart failure: Secondary | ICD-10-CM

## 2021-06-23 DIAGNOSIS — F330 Major depressive disorder, recurrent, mild: Secondary | ICD-10-CM

## 2021-06-23 DIAGNOSIS — I110 Hypertensive heart disease with heart failure: Secondary | ICD-10-CM

## 2021-06-23 DIAGNOSIS — G720 Drug-induced myopathy: Secondary | ICD-10-CM

## 2021-06-23 LAB — POCT UA - MICROALBUMIN: Microalbumin Ur, POC: 80 mg/L

## 2021-06-23 MED ORDER — OZEMPIC (0.25 OR 0.5 MG/DOSE) 2 MG/1.5ML ~~LOC~~ SOPN: 0.5000 mg | PEN_INJECTOR | SUBCUTANEOUS | 0 refills | Status: DC

## 2021-06-23 NOTE — Progress Notes (Signed)
Subjective:  Patient ID: Melinda Nguyen, female    DOB: 06/15/1961  Age: 60 y.o. MRN: [id number]  Chief Complaint  Patient presents with   Hyperlipidemia   Diabetes   HPI  Diabetes:  Complications: glomerulopathy. Glucose checking: once weekly. Glucose logs: 80-90s to 200s (in evening) Hypoglycemia: no Most recent A1C: 7.2 Current medications: lantus 26 units, metformin 1000 mg bid Last Eye Exam: 05/2021 October 20th is having retinal surgery.  Foot checks: daily SGL2: Jardiance and farxiga both caused yeast infection.  Tried victoza (GLP1): she thought it caused heart burn, but she has had no heartburn since her heart attack. Has not taken omeprazole in 3-4 months ago.   Hyperlipidemia: Current medications: none.  Intolerant to statins and zetia.   Hypertension with CAD: Complications: CAD Current medications: coreg 3.125mg , losartan 25 mg, aspirin daily. Diet: Healthy Exercise: Walking 2 days per week.   Depression: on cymbalta 30 mg once daily.  Since had cholecystectomy in 01/2021 and pt has been having loose stools.   Current Outpatient Medications on File Prior to Visit  Medication Sig Dispense Refill   Ascorbic Acid (VITAMIN C) 1000 MG tablet Take 1,000 mg by mouth 2 (two) times a day.     aspirin EC 81 MG tablet Take 1 tablet (81 mg total) by mouth daily. 90 tablet 3   carvedilol (COREG) 3.125 MG tablet TAKE 1 TABLET (3.125 MG TOTAL) BY MOUTH 2 (TWO) TIMES DAILY WITH A MEAL. 180 tablet 4   DULoxetine (CYMBALTA) 30 MG capsule TAKE 1 CAPSULE BY MOUTH AT BEDTIME. 90 capsule 0   furosemide (LASIX) 40 MG tablet TAKE 1 TABLET (40 MG) BY ORAL ROUTE DAILY 90 tablet 1   Insulin Pen Needle (B-D UF III MINI PEN NEEDLES) 31G X 5 MM MISC 2 g by Does not apply route 4 (four) times daily as needed. 100 each 3   LANTUS SOLOSTAR 100 UNIT/ML Solostar Pen INJECT 20 UNITS INTO THE SKIN AT BEDTIME. (Patient taking differently: 26 Units. INJECT 26 UNITS INTO THE SKIN AT BEDTIME.) 18 mL 0    LORazepam (ATIVAN) 0.5 MG tablet Take 0.5 mg by mouth 2 (two) times daily as needed for anxiety.     losartan (COZAAR) 25 MG tablet TAKE 1 TABLET BY MOUTH EVERY DAY 90 tablet 3   metFORMIN (GLUCOPHAGE) 1000 MG tablet TAKE 1 TABLET BY MOUTH TWICE A DAY WITH BREAKFAST AND DINNER 180 tablet 1   nitroGLYCERIN (NITROSTAT) 0.4 MG SL tablet Place 1 tablet (0.4 mg total) under the tongue every 5 (five) minutes as needed. 25 tablet 12   omeprazole (PRILOSEC) 20 MG capsule Take 20 mg by mouth daily as needed (acid reflux).     No current facility-administered medications on file prior to visit.   Past Medical History:  Diagnosis Date   AICD (automatic cardioverter/defibrillator) present 03/13/2019   CAD (coronary artery disease)    CHF (congestive heart failure) (HCC)    Depression    Diabetes mellitus without complication (HCC)    GERD (gastroesophageal reflux disease)    Hypertension    Mixed hyperlipidemia    Myocardial infarction (HCC) 2020   Other hypotension    Smoking history 03/10/2019   Smokes 1/2 pack a day   Ventricular tachycardia (HCC)    Past Surgical History:  Procedure Laterality Date   ABDOMINAL HYSTERECTOMY     ICD IMPLANT N/A 03/13/2019   Procedure: ICD IMPLANT;  Surgeon: Regan Lemming, MD;  Location: [facility];  Service: Cardiovascular;  Laterality: N/A;   ICD IMPLANT  03/13/2019   LEFT HEART CATH AND CORONARY ANGIOGRAPHY N/A 03/12/2019   Procedure: LEFT HEART CATH AND CORONARY ANGIOGRAPHY;  Surgeon: Iran Ouch, MD;  Location: [facility];  Service: Cardiovascular;  Laterality: N/A;    Family History  Problem Relation Age of Onset   Hypertension Mother    Diabetes Mother    COPD Father    Social History   Socioeconomic History   Marital status: Widowed    Spouse name: Not on file   Number of children: Not on file   Years of education: Not on file   Highest education level: Not on file  Occupational History   Not on file  Tobacco  Use   Smoking status: Former    Packs/day: 0.50    Types: Cigarettes    Quit date: 03/2019    Years since quitting: 2.2   Smokeless tobacco: Never  Vaping Use   Vaping Use: Never used  Substance and Sexual Activity   Alcohol use: Not Currently   Drug use: Never   Sexual activity: Not on file  Other Topics Concern   Not on file  Social History Narrative   Not on file   Social Determinants of Health   Financial Resource Strain: Not on file  Food Insecurity: Not on file  Transportation Needs: Not on file  Physical Activity: Not on file  Stress: Not on file  Social Connections: Not on file    Review of Systems  Constitutional:  Negative for chills, fatigue and fever.  HENT:  Negative for congestion, ear pain, rhinorrhea and sore throat.   Respiratory:  Negative for cough and shortness of breath.   Cardiovascular:  Negative for chest pain.  Gastrointestinal:  Negative for abdominal pain, constipation, diarrhea, nausea and vomiting.  Endocrine: Negative for polydipsia, polyphagia and polyuria.  Genitourinary:  Negative for dysuria and urgency.  Musculoskeletal:  Negative for back pain and myalgias.  Neurological:  Negative for dizziness, weakness, light-headedness and headaches.  Psychiatric/Behavioral:  Negative for dysphoric mood, sleep disturbance and suicidal ideas. The patient is not nervous/anxious.     Objective:  BP 124/64   Pulse 78   Temp (!) 97.2 F (36.2 C)   Resp 14   Ht 5\' 4"  (1.626 m)   Wt 196 lb (88.9 kg)   BMI 33.64 kg/m   BP/Weight 06/23/2021 12/24/2020 09/01/2020  Systolic BP 124 136 158  Diastolic BP 64 72 62  Wt. (Lbs) 196 188 191  BMI 33.64 32.27 32.79    Physical Exam Vitals reviewed.  Constitutional:      Appearance: Normal appearance. She is obese.  Neck:     Vascular: No carotid bruit.  Cardiovascular:     Rate and Rhythm: Normal rate and regular rhythm.     Pulses: Normal pulses.     Heart sounds: Normal heart sounds.  Pulmonary:      Effort: Pulmonary effort is normal. No respiratory distress.     Breath sounds: Normal breath sounds.  Abdominal:     General: Abdomen is flat. Bowel sounds are normal.     Palpations: Abdomen is soft.     Tenderness: There is no abdominal tenderness.  Neurological:     Mental Status: She is alert and oriented to person, place, and time.  Psychiatric:        Mood and Affect: Mood normal.        Behavior: Behavior normal.    Diabetic Foot Exam -  Simple   Simple Foot Form Diabetic Foot exam was performed with the following findings: Yes 06/23/2021  8:23 AM  Visual Inspection No deformities, no ulcerations, no other skin breakdown bilaterally: Yes Sensation Testing Intact to touch and monofilament testing bilaterally: Yes Pulse Check Posterior Tibialis and Dorsalis pulse intact bilaterally: Yes Comments      Lab Results  Component Value Date   WBC 9.7 06/23/2021   HGB 14.3 06/23/2021   HCT 43.6 06/23/2021   PLT 269 06/23/2021   GLUCOSE 113 (H) 06/23/2021   CHOL 241 (H) 06/23/2021   TRIG 155 (H) 06/23/2021   HDL 63 06/23/2021   LDLCALC 150 (H) 06/23/2021   ALT 10 06/23/2021   AST 13 06/23/2021   NA 140 06/23/2021   K 5.0 06/23/2021   CL 100 06/23/2021   CREATININE 0.60 06/23/2021   BUN 8 06/23/2021   CO2 26 06/23/2021   HGBA1C 8.4 (H) 06/23/2021   MICROALBUR 80 06/23/2021      Assessment & Plan:   Problem List Items Addressed This Visit       Cardiovascular and Mediastinum   Coronary artery disease completely occluded circumflex artery based on cardiac cath in June 2020    Well controlled.  Intolerant to statin. Recommend repatha.  Continue aspirin, carvedilol, losartan. Continue to work on eating a healthy diet and exercise.  Labs drawn today.        Hypertensive heart disease with chronic systolic congestive heart failure (HCC)   Relevant Orders   Comprehensive metabolic panel (Completed)     Endocrine   Diabetic glomerulopathy (HCC) - Primary     Check sugars once daily in am.  Start ozempic 0.25 mg once weekly x 4 weeks, then increase to 0.5 mg weekly.  Continue lantus 20 U daily (I sent rx for 26 U daily just in case) Continue metformin.  Call or drop of sugar log in 1 month.  Recommend increase losartan      Relevant Medications   Semaglutide,0.25 or 0.5MG /DOS, (OZEMPIC, 0.25 OR 0.5 MG/DOSE,) 2 MG/1.5ML SOPN   Other Relevant Orders   CBC with Differential/Platelet (Completed)   Hemoglobin A1c (Completed)   POCT UA - Microalbumin (Completed)     Other   Class 1 obesity due to excess calories with serious comorbidity and body mass index (BMI) of 33.0 to 33.9 in adult    Recommend continue to work on eating healthy diet and exercise.       Relevant Medications   Semaglutide,0.25 or 0.5MG /DOS, (OZEMPIC, 0.25 OR 0.5 MG/DOSE,) 2 MG/1.5ML SOPN   Mixed hyperlipidemia    Not at goal. Poor control.  Recommend repatha. Continue to work on eating a healthy diet and exercise.  Labs drawn today.        Relevant Orders   Lipid panel (Completed)   COVID-19 vaccination refused  .  Meds ordered this encounter  Medications   Semaglutide,0.25 or 0.5MG /DOS, (OZEMPIC, 0.25 OR 0.5 MG/DOSE,) 2 MG/1.5ML SOPN    Sig: Inject 0.5 mg into the skin once a week.    Dispense:  1.5 mL    Refill:  0     Orders Placed This Encounter  Procedures   CBC with Differential/Platelet   Comprehensive metabolic panel   Lipid panel   Hemoglobin A1c   Cardiovascular Risk Assessment   POCT UA - Microalbumin      Follow-up: Return in about 3 months (around 09/22/2021) for chronic fasting.  An After Visit Summary was printed and given to the  patient.  Rochel Brome, MD [facility] [phone]

## 2021-06-23 NOTE — Patient Instructions (Signed)
Check sugars once daily in am.  Start ozempic 0.25 mg once weekly x 4 weeks, then increase to 0.5 mg weekly.  Continue lantus 20 U daily (I sent rx for 26 U daily just in case) Continue metformin.  Call or drop of sugar log in 1 month.

## 2021-06-24 LAB — CBC WITH DIFFERENTIAL/PLATELET
Basophils Absolute: 0.1 10*3/uL (ref 0.0–0.2)
Basos: 1 %
EOS (ABSOLUTE): 0.3 10*3/uL (ref 0.0–0.4)
Eos: 3 %
Hematocrit: 43.6 % (ref 34.0–46.6)
Hemoglobin: 14.3 g/dL (ref 11.1–15.9)
Immature Grans (Abs): 0 10*3/uL (ref 0.0–0.1)
Immature Granulocytes: 0 %
Lymphocytes Absolute: 2.7 10*3/uL (ref 0.7–3.1)
Lymphs: 28 %
MCH: 28.5 pg (ref 26.6–33.0)
MCHC: 32.8 g/dL (ref 31.5–35.7)
MCV: 87 fL (ref 79–97)
Monocytes Absolute: 0.7 10*3/uL (ref 0.1–0.9)
Monocytes: 7 %
Neutrophils Absolute: 5.9 10*3/uL (ref 1.4–7.0)
Neutrophils: 61 %
Platelets: 269 10*3/uL (ref 150–450)
RBC: 5.02 x10E6/uL (ref 3.77–5.28)
RDW: 13.2 % (ref 11.7–15.4)
WBC: 9.7 10*3/uL (ref 3.4–10.8)

## 2021-06-24 LAB — LIPID PANEL
Chol/HDL Ratio: 3.8 ratio (ref 0.0–4.4)
Cholesterol, Total: 241 mg/dL — ABNORMAL HIGH (ref 100–199)
HDL: 63 mg/dL (ref >39)
LDL Chol Calc (NIH): 150 mg/dL — ABNORMAL HIGH (ref 0–99)
Triglycerides: 155 mg/dL — ABNORMAL HIGH (ref 0–149)
VLDL Cholesterol Cal: 28 mg/dL (ref 5–40)

## 2021-06-24 LAB — COMPREHENSIVE METABOLIC PANEL
ALT: 10 IU/L (ref 0–32)
AST: 13 IU/L (ref 0–40)
Albumin/Globulin Ratio: 1.4 (ref 1.2–2.2)
Albumin: 4.1 g/dL (ref 3.8–4.8)
Alkaline Phosphatase: 134 IU/L — ABNORMAL HIGH (ref 44–121)
BUN/Creatinine Ratio: 13 (ref 12–28)
BUN: 8 mg/dL (ref 8–27)
Bilirubin Total: 0.4 mg/dL (ref 0.0–1.2)
CO2: 26 mmol/L (ref 20–29)
Calcium: 9 mg/dL (ref 8.7–10.3)
Chloride: 100 mmol/L (ref 96–106)
Creatinine, Ser: 0.6 mg/dL (ref 0.57–1.00)
Globulin, Total: 3 g/dL (ref 1.5–4.5)
Glucose: 113 mg/dL — ABNORMAL HIGH (ref 65–99)
Potassium: 5 mmol/L (ref 3.5–5.2)
Sodium: 140 mmol/L (ref 134–144)
Total Protein: 7.1 g/dL (ref 6.0–8.5)
eGFR: 102 mL/min/{1.73_m2} (ref >59)

## 2021-06-24 LAB — HEMOGLOBIN A1C
Est. average glucose Bld gHb Est-mCnc: 194 mg/dL
Hgb A1c MFr Bld: 8.4 % — ABNORMAL HIGH (ref 4.8–5.6)

## 2021-06-26 ENCOUNTER — Encounter: Payer: Self-pay | Admitting: Family Medicine

## 2021-06-26 DIAGNOSIS — I110 Hypertensive heart disease with heart failure: Secondary | ICD-10-CM | POA: Insufficient documentation

## 2021-06-26 DIAGNOSIS — I5022 Chronic systolic (congestive) heart failure: Secondary | ICD-10-CM | POA: Insufficient documentation

## 2021-06-26 DIAGNOSIS — E782 Mixed hyperlipidemia: Secondary | ICD-10-CM | POA: Insufficient documentation

## 2021-06-26 DIAGNOSIS — Z2821 Immunization not carried out because of patient refusal: Secondary | ICD-10-CM | POA: Insufficient documentation

## 2021-06-26 NOTE — Assessment & Plan Note (Signed)
Well controlled.  ?No changes to medicines.  ?Continue to work on eating a healthy diet and exercise.  ?Labs drawn today.  ?

## 2021-06-26 NOTE — Assessment & Plan Note (Signed)
Recommend continue to work on eating healthy diet and exercise.  

## 2021-06-26 NOTE — Assessment & Plan Note (Signed)
Not at goal. Poor control.  Recommend repatha. Continue to work on eating a healthy diet and exercise.  Labs drawn today.

## 2021-06-26 NOTE — Assessment & Plan Note (Addendum)
Well controlled.  Intolerant to statin. Recommend repatha.  Continue aspirin, carvedilol, losartan. Continue to work on eating a healthy diet and exercise.  Labs drawn today.

## 2021-06-26 NOTE — Assessment & Plan Note (Signed)
Check sugars once daily in am.  Start ozempic 0.25 mg once weekly x 4 weeks, then increase to 0.5 mg weekly.  Continue lantus 20 U daily (I sent rx for 26 U daily just in case) Continue metformin.  Call or drop of sugar log in 1 month.  Recommend increase losartan

## 2021-06-26 NOTE — Assessment & Plan Note (Signed)
Not at goal. Recommend repatha.  Continue to work on eating a healthy diet and exercise.  Labs drawn today.

## 2021-06-27 ENCOUNTER — Other Ambulatory Visit: Payer: Self-pay

## 2021-06-27 MED ORDER — LOSARTAN POTASSIUM 50 MG PO TABS: 50.0000 mg | ORAL_TABLET | Freq: Every day | ORAL | 0 refills | Status: DC

## 2021-06-27 MED ORDER — METFORMIN HCL 1000 MG PO TABS: 1000.0000 mg | ORAL_TABLET | Freq: Two times a day (BID) | ORAL | 1 refills | Status: DC

## 2021-06-27 MED ORDER — TOUJEO MAX SOLOSTAR 300 UNIT/ML ~~LOC~~ SOPN: 26.0000 [IU] | PEN_INJECTOR | Freq: Every day | SUBCUTANEOUS | 0 refills | Status: DC

## 2021-06-27 MED ORDER — OZEMPIC (0.25 OR 0.5 MG/DOSE) 2 MG/1.5ML ~~LOC~~ SOPN: 0.5000 mg | PEN_INJECTOR | SUBCUTANEOUS | 0 refills | Status: DC

## 2021-06-27 MED ORDER — GLUCOSE BLOOD VI STRP: 1.0000 | ORAL_STRIP | 6 refills | Status: DC | PRN

## 2021-06-28 ENCOUNTER — Other Ambulatory Visit: Payer: Self-pay

## 2021-06-28 DIAGNOSIS — I9589 Other hypotension: Secondary | ICD-10-CM | POA: Insufficient documentation

## 2021-06-28 DIAGNOSIS — F330 Major depressive disorder, recurrent, mild: Secondary | ICD-10-CM | POA: Insufficient documentation

## 2021-06-28 DIAGNOSIS — F32A Depression, unspecified: Secondary | ICD-10-CM | POA: Insufficient documentation

## 2021-06-28 DIAGNOSIS — E119 Type 2 diabetes mellitus without complications: Secondary | ICD-10-CM | POA: Insufficient documentation

## 2021-06-29 ENCOUNTER — Ambulatory Visit (INDEPENDENT_AMBULATORY_CARE_PROVIDER_SITE_OTHER): Payer: 59 | Admitting: Cardiology

## 2021-06-29 ENCOUNTER — Other Ambulatory Visit: Payer: Self-pay

## 2021-06-29 VITALS — BP 138/74 | HR 116 | Ht 64.0 in | Wt 193.4 lb

## 2021-06-29 DIAGNOSIS — Z95810 Presence of automatic (implantable) cardiac defibrillator: Secondary | ICD-10-CM

## 2021-06-29 DIAGNOSIS — Z79899 Other long term (current) drug therapy: Secondary | ICD-10-CM

## 2021-06-29 DIAGNOSIS — I472 Ventricular tachycardia, unspecified: Secondary | ICD-10-CM

## 2021-06-29 DIAGNOSIS — I10 Essential (primary) hypertension: Secondary | ICD-10-CM | POA: Diagnosis not present

## 2021-06-29 DIAGNOSIS — I2510 Atherosclerotic heart disease of native coronary artery without angina pectoris: Secondary | ICD-10-CM | POA: Diagnosis not present

## 2021-06-29 DIAGNOSIS — I255 Ischemic cardiomyopathy: Secondary | ICD-10-CM

## 2021-06-29 MED ORDER — LOSARTAN POTASSIUM 50 MG PO TABS: 50.0000 mg | ORAL_TABLET | Freq: Every day | ORAL | 3 refills | Status: DC

## 2021-06-29 NOTE — Addendum Note (Signed)
Addended by: Reynolds Bowl on: 06/29/2021 01:55 PM   Modules accepted: Orders

## 2021-06-29 NOTE — Patient Instructions (Signed)
Medication Instructions:   Your physician has recommended you make the following change in your medication:  INCREASE: Losartan 50 mg once daily *If you need a refill on your cardiac medications before your next appointment, please call your pharmacy*   Lab Work: Your physician recommends that you return for lab work in:  5-7 days after starting Losartan 50 mg:  BMET If you have labs (blood work) drawn today and your tests are completely normal, you will receive your results only by: MyChart Message (if you have MyChart) OR A paper copy in the mail If you have any lab test that is abnormal or we need to change your treatment, we will call you to review the results.   Testing/Procedures: Your physician has requested that you have an echocardiogram. Echocardiography is a painless test that uses sound waves to create images of your heart. It provides your doctor with information about the size and shape of your heart and how well your heart's chambers and valves are working. This procedure takes approximately one hour. There are no restrictions for this procedure.    Follow-Up: At [facility], you and your health needs are our priority.  As part of our continuing mission to provide you with exceptional heart care, we have created designated Provider Care Teams.  These Care Teams include your primary Cardiologist (physician) and Advanced Practice Providers (APPs -  Physician Assistants and Nurse Practitioners) who all work together to provide you with the care you need, when you need it.  We recommend signing up for the patient portal called "MyChart".  Sign up information is provided on this After Visit Summary.  MyChart is used to connect with patients for Virtual Visits (Telemedicine).  Patients are able to view lab/test results, encounter notes, upcoming appointments, etc.  Non-urgent messages can be sent to your provider as well.   To learn more about what you can do with MyChart, go to  [email or web address].    Your next appointment:   6 month(s)  The format for your next appointment:   In Person  Provider:   Gypsy Balsam, MD   Other Instructions Echocardiogram An echocardiogram is a test that uses sound waves (ultrasound) to produce images of the heart. Images from an echocardiogram can provide important information about: Heart size and shape. The size and thickness and movement of your heart's walls. Heart muscle function and strength. Heart valve function or if you have stenosis. Stenosis is when the heart valves are too narrow. If blood is flowing backward through the heart valves (regurgitation). A tumor or infectious growth around the heart valves. Areas of heart muscle that are not working well because of poor blood flow or injury from a heart attack. Aneurysm detection. An aneurysm is a weak or damaged part of an artery wall. The wall bulges out from the normal force of blood pumping through the body. Tell a health care provider about: Any allergies you have. All medicines you are taking, including vitamins, herbs, eye drops, creams, and over-the-counter medicines. Any blood disorders you have. Any surgeries you have had. Any medical conditions you have. Whether you are pregnant or may be pregnant. What are the risks? Generally, this is a safe test. However, problems may occur, including an allergic reaction to dye (contrast) that may be used during the test. What happens before the test? No specific preparation is needed. You may eat and drink normally. What happens during the test?  You will take off your clothes from the waist  up and put on a hospital gown. Electrodes or electrocardiogram (ECG)patches may be placed on your chest. The electrodes or patches are then connected to a device that monitors your heart rate and rhythm. You will lie down on a table for an ultrasound exam. A gel will be applied to your chest to help sound waves pass  through your skin. A handheld device, called a transducer, will be pressed against your chest and moved over your heart. The transducer produces sound waves that travel to your heart and bounce back (or "echo" back) to the transducer. These sound waves will be captured in real-time and changed into images of your heart that can be viewed on a video monitor. The images will be recorded on a computer and reviewed by your health care provider. You may be asked to change positions or hold your breath for a short time. This makes it easier to get different views or better views of your heart. In some cases, you may receive contrast through an IV in one of your veins. This can improve the quality of the pictures from your heart. The procedure may vary among health care providers and hospitals. What can I expect after the test? You may return to your normal, everyday life, including diet, activities, and medicines, unless your health care provider tells you not to do that. Follow these instructions at home: It is up to you to get the results of your test. Ask your health care provider, or the department that is doing the test, when your results will be ready. Keep all follow-up visits. This is important. Summary An echocardiogram is a test that uses sound waves (ultrasound) to produce images of the heart. Images from an echocardiogram can provide important information about the size and shape of your heart, heart muscle function, heart valve function, and other possible heart problems. You do not need to do anything to prepare before this test. You may eat and drink normally. After the echocardiogram is completed, you may return to your normal, everyday life, unless your health care provider tells you not to do that. This information is not intended to replace advice given to you by your health care provider. Make sure you discuss any questions you have with your health care provider. Document Revised:  05/18/2020 Document Reviewed: 05/18/2020 Elsevier Patient Education  2022 [employer].

## 2021-06-29 NOTE — Progress Notes (Signed)
Cardiology Office Note:    Date:  06/29/2021   ID:  [name], DOB 1961/09/17, MRN [id number]  PCP:  Blane Ohara, MD  Cardiologist:  Gypsy Balsam, MD    Referring MD: Blane Ohara, MD   Chief Complaint  Patient presents with   Follow-up  I am doing fine  History of Present Illness:    Chantell Kunkler is a 60 y.o. female with past medical history significant for coronary artery disease cardiac catheterization done many years ago showed completely occluded circumflex artery.  At the time she was also was found to have diminished ejection fraction Nebido 4045%.  She also has history of ventricular tachycardia.  She does have ICD placed which is [employer] device.  She also got diabetes dyslipidemia She comes today 2 months of follow-up overall she is doing very well.  She denies have any chest pain tightness squeezing pressure burning chest.  Tomorrow she is going to the beach to the ocean I will for about 10 days.  She is looking forward to it.  Denies having the discharges from the defibrillator.  Past Medical History:  Diagnosis Date   AICD (automatic cardioverter/defibrillator) present 03/13/2019   CAD (coronary artery disease)    CHF (congestive heart failure) (HCC)    Depression    Diabetes mellitus without complication (HCC)    GERD (gastroesophageal reflux disease)    Hypertension    Mixed hyperlipidemia    Myocardial infarction (HCC) 2020   Other hypotension    Smoking history 03/10/2019   Smokes 1/2 pack a day   Ventricular tachycardia (HCC)     Past Surgical History:  Procedure Laterality Date   ABDOMINAL HYSTERECTOMY     ICD IMPLANT N/A 03/13/2019   Procedure: ICD IMPLANT;  Surgeon: Regan Lemming, MD;  [facility];  Service: Cardiovascular;  Laterality: N/A;   ICD IMPLANT  03/13/2019   LEFT HEART CATH AND CORONARY ANGIOGRAPHY N/A 03/12/2019   Procedure: LEFT HEART CATH AND CORONARY ANGIOGRAPHY;  Surgeon: Iran Ouch, MD;  [facility]: [facility]  IN[facility];  Service: Cardiovascular;  Laterality: N/A;    Current Medications: Current Meds  Medication Sig   aspirin EC 81 MG tablet Take 1 tablet (81 mg total) by mouth daily.   carvedilol (COREG) 3.125 MG tablet TAKE 1 TABLET (3.125 MG TOTAL) BY MOUTH 2 (TWO) TIMES DAILY WITH A MEAL.   DULoxetine (CYMBALTA) 30 MG capsule TAKE 1 CAPSULE BY MOUTH AT BEDTIME. (Patient taking differently: Take 30 mg by mouth at bedtime.)   furosemide (LASIX) 40 MG tablet TAKE 1 TABLET (40 MG) BY ORAL ROUTE DAILY (Patient taking differently: 40 mg daily.)   glucose blood test strip 1 each by Other route as needed. Test blood sugar 3 times daily (Patient taking differently: 1 each by Other route as needed for other (Glucose reading). Test blood sugar 3 times daily)   insulin glargine, 2 Unit Dial, (TOUJEO MAX SOLOSTAR) 300 UNIT/ML Solostar Pen Inject 26 Units into the skin daily.   Insulin Pen Needle (B-D UF III MINI PEN NEEDLES) 31G X 5 MM MISC 2 g by Does not apply route 4 (four) times daily as needed. (Patient taking differently: 2 g by Does not apply route 4 (four) times daily as needed (For injectable meds).)   LORazepam (ATIVAN) 0.5 MG tablet Take 0.5 mg by mouth 2 (two) times daily as needed for anxiety.   losartan (COZAAR) 25 MG tablet Take 25 mg by mouth daily.   metFORMIN (  GLUCOPHAGE) 1000 MG tablet Take 1 tablet (1,000 mg total) by mouth 2 (two) times daily with a meal.   nitroGLYCERIN (NITROSTAT) 0.4 MG SL tablet Place 1 tablet (0.4 mg total) under the tongue every 5 (five) minutes as needed. (Patient taking differently: Place 0.4 mg under the tongue every 5 (five) minutes as needed for chest pain.)   ofloxacin (OCUFLOX) 0.3 % ophthalmic solution Place 1 drop into the right eye 4 (four) times daily.   omeprazole (PRILOSEC) 20 MG capsule Take 20 mg by mouth daily as needed (acid reflux).   prednisoLONE acetate (PRED FORTE) 1 % ophthalmic suspension Place 1 drop into the right eye 4 (four) times  daily.   Semaglutide,0.25 or 0.5MG /DOS, (OZEMPIC, 0.25 OR 0.5 MG/DOSE,) 2 MG/1.5ML SOPN Inject 0.5 mg into the skin once a week. (Patient taking differently: Inject 0.25 mg into the skin once a week.)     Allergies:   Shrimp [shellfish allergy], Hydrocodone, Codeine, Crestor [rosuvastatin], Farxiga [dapagliflozin], Flagyl [metronidazole], Glimepiride, Jardiance [empagliflozin], Lipitor [atorvastatin], Victoza [liraglutide], and Zetia [ezetimibe]   Social History   Socioeconomic History   Marital status: Widowed    Spouse name: Not on file   Number of children: Not on file   Years of education: Not on file   Highest education level: Not on file  Occupational History   Not on file  Tobacco Use   Smoking status: Former    Packs/day: 0.50    Types: Cigarettes    Quit date: 03/2019    Years since quitting: 2.3   Smokeless tobacco: Never  Vaping Use   Vaping Use: Never used  Substance and Sexual Activity   Alcohol use: Not Currently   Drug use: Never   Sexual activity: Not on file  Other Topics Concern   Not on file  Social History Narrative   Not on file   Social Determinants of Health   Financial Resource Strain: Not on file  Food Insecurity: Not on file  Transportation Needs: Not on file  Physical Activity: Not on file  Stress: Not on file  Social Connections: Not on file     Family History: The patient's family history includes COPD in her father; Diabetes in her mother; Hypertension in her mother. ROS:   Please see the history of present illness.    All 14 point review of systems negative except as described per history of present illness  EKGs/Labs/Other Studies Reviewed:      Recent Labs: 06/23/2021: ALT 10; BUN 8; Creatinine, Ser 0.60; Hemoglobin 14.3; Platelets 269; Potassium 5.0; Sodium 140  Recent Lipid Panel    Component Value Date/Time   CHOL 241 (H) 06/23/2021 0903   TRIG 155 (H) 06/23/2021 0903   HDL 63 06/23/2021 0903   CHOLHDL 3.8 06/23/2021  0903   CHOLHDL 3.7 03/11/2019 0101   VLDL 22 03/11/2019 0101   LDLCALC 150 (H) 06/23/2021 0903    Physical Exam:    VS:  BP 138/74 (BP Location: Right Arm, Patient Position: Sitting)   Pulse (!) 116   Ht 5\' 4"  (1.626 m)   Wt 193 lb 6.4 oz (87.7 kg)   SpO2 95%   BMI 33.20 kg/m     Wt Readings from Last 3 Encounters:  06/29/21 193 lb 6.4 oz (87.7 kg)  06/23/21 196 lb (88.9 kg)  12/24/20 188 lb (85.3 kg)     GEN:  Well nourished, well developed in no acute distress HEENT: Normal NECK: No JVD; No carotid bruits LYMPHATICS: No lymphadenopathy  CARDIAC: RRR, no murmurs, no rubs, no gallops RESPIRATORY:  Clear to auscultation without rales, wheezing or rhonchi  ABDOMEN: Soft, non-tender, non-distended MUSCULOSKELETAL:  No edema; No deformity  SKIN: Warm and dry LOWER EXTREMITIES: no swelling NEUROLOGIC:  Alert and oriented x 3 PSYCHIATRIC:  Normal affect   ASSESSMENT:    1. Coronary artery disease involving native coronary artery of native heart without angina pectoris   2. Essential hypertension   3. Ischemic cardiomyopathy   4. Ventricular tachycardia (HCC)   5. ICD (implantable cardioverter-defibrillator) in place    PLAN:    In order of problems listed above:  Coronary artery disease complete occluded circumflex artery.  She is on appropriate medication which include antiplatelets therapy in form of aspirin which I will continue.  She denies having any chest pain.  Essential hypertension blood pressure well controlled continue present management. Dyslipidemia I did review her K PN dated from September 15 which is just week ago showed LDL of 150 HDL 63.  We will try statin Crestor and Lipitor she did have muscle aches and joint aches.  We also tried Zetia and she was unable to tolerate that medication either.  I had a long discussion today with her about need to control her cholesterol better I offered her lipid clinic appointment with intention to initiate PCSK9 agent.   She said she would like to think about it before committing she said she would like to try red is rice.  I told her that that will definitely not be sufficient. ICD present I did review last interrogation patient got 6.6 years left in the battery of the device no episode of arrhythmia recorded that required therapy. Diabetes followed by antimedicine team.  I did review K PN which show me her hemoglobin A1c of 8.4 which is clearly not sufficiently controlled.  I gave her beak talk about need to exercise and good diet hopefully she will comply.   Medication Adjustments/Labs and Tests Ordered: Current medicines are reviewed at length with the patient today.  Concerns regarding medicines are outlined above.  No orders of the defined types were placed in this encounter.  Medication changes: No orders of the defined types were placed in this encounter.   Signed, Georgeanna Lea, MD, [facility] 06/29/2021 1:30 PM    [facility]

## 2021-07-04 DIAGNOSIS — G720 Drug-induced myopathy: Secondary | ICD-10-CM | POA: Insufficient documentation

## 2021-07-04 DIAGNOSIS — T466X5A Adverse effect of antihyperlipidemic and antiarteriosclerotic drugs, initial encounter: Secondary | ICD-10-CM | POA: Insufficient documentation

## 2021-07-04 NOTE — Assessment & Plan Note (Signed)
Intolerant to statins. 

## 2021-07-04 NOTE — Assessment & Plan Note (Signed)
Well-controlled on Cymbalta.  No changes recommended.

## 2021-07-26 ENCOUNTER — Other Ambulatory Visit: Payer: 59

## 2021-08-03 ENCOUNTER — Other Ambulatory Visit: Payer: Self-pay | Admitting: Family Medicine

## 2021-09-06 ENCOUNTER — Other Ambulatory Visit: Payer: 59

## 2021-09-08 ENCOUNTER — Other Ambulatory Visit: Payer: Self-pay | Admitting: Family Medicine

## 2021-09-08 ENCOUNTER — Other Ambulatory Visit: Payer: Self-pay | Admitting: Physician Assistant

## 2021-09-08 MED ORDER — LANTUS SOLOSTAR 100 UNIT/ML ~~LOC~~ SOPN: 26.0000 [IU] | PEN_INJECTOR | Freq: Every day | SUBCUTANEOUS | 0 refills | Status: DC

## 2021-09-08 NOTE — Progress Notes (Signed)
Pt requesting to change from toujeo to lantus 26 U daily.  Discontinue ozempic.  Requesting dupixent. Pt needs to call prescribing doctor.  Carolyn left message as to the above.  Dr. Sedalia Muta

## 2021-09-09 ENCOUNTER — Other Ambulatory Visit: Payer: Self-pay

## 2021-09-09 MED ORDER — LANTUS SOLOSTAR 100 UNIT/ML ~~LOC~~ SOPN: 26.0000 [IU] | PEN_INJECTOR | Freq: Every day | SUBCUTANEOUS | 0 refills | Status: DC

## 2021-09-09 MED ORDER — DULOXETINE HCL 30 MG PO CPEP
30.0000 mg | ORAL_CAPSULE | Freq: Every day | ORAL | 0 refills | Status: DC
Start: 2021-09-09 — End: 2021-12-02

## 2021-09-13 ENCOUNTER — Other Ambulatory Visit: Payer: 59

## 2021-09-19 ENCOUNTER — Other Ambulatory Visit: Payer: Self-pay | Admitting: Family Medicine

## 2021-09-19 ENCOUNTER — Ambulatory Visit (INDEPENDENT_AMBULATORY_CARE_PROVIDER_SITE_OTHER): Payer: 59

## 2021-09-19 DIAGNOSIS — I255 Ischemic cardiomyopathy: Secondary | ICD-10-CM

## 2021-09-20 LAB — CUP PACEART REMOTE DEVICE CHECK
Battery Remaining Longevity: 77 mo
Battery Remaining Percentage: 76 %
Battery Voltage: 2.99 V
Brady Statistic RV Percent Paced: 1 %
Date Time Interrogation Session: 20221211220259
HighPow Impedance: 86 Ohm
HighPow Impedance: 86 Ohm
Implantable Lead Implant Date: 20200604
Implantable Lead Location: 753860
Implantable Pulse Generator Implant Date: 20200604
Lead Channel Impedance Value: 430 Ohm
Lead Channel Pacing Threshold Amplitude: 0.5 V
Lead Channel Pacing Threshold Pulse Width: 0.5 ms
Lead Channel Sensing Intrinsic Amplitude: 11.3 mV
Lead Channel Setting Pacing Amplitude: 2.5 V
Lead Channel Setting Pacing Pulse Width: 0.5 ms
Lead Channel Setting Sensing Sensitivity: 0.5 mV
Pulse Gen Serial Number: 9860889

## 2021-09-27 ENCOUNTER — Other Ambulatory Visit: Payer: Self-pay

## 2021-09-27 ENCOUNTER — Ambulatory Visit (INDEPENDENT_AMBULATORY_CARE_PROVIDER_SITE_OTHER): Payer: 59

## 2021-09-27 DIAGNOSIS — I255 Ischemic cardiomyopathy: Secondary | ICD-10-CM

## 2021-09-27 LAB — ECHOCARDIOGRAM COMPLETE
Area-P 1/2: 5.62 cm2
S' Lateral: 3.4 cm

## 2021-09-27 NOTE — Progress Notes (Signed)
Remote ICD transmission.   

## 2021-09-29 ENCOUNTER — Ambulatory Visit: Payer: 59 | Admitting: Family Medicine

## 2021-10-09 HISTORY — PX: EYE SURGERY: SHX253

## 2021-10-13 ENCOUNTER — Telehealth: Payer: Self-pay | Admitting: Cardiology

## 2021-10-13 NOTE — Telephone Encounter (Signed)
° °  Pre-operative Risk Assessment    Patient Name: Melinda Nguyen  DOB: 31-Jan-1961 MRN: [id number]      Request for Surgical Clearance    Procedure:   membrane peel, vitrectomy, pan retinal , photocoagulation, intravitreal injection of eylea to the right eye   Date of Surgery:  Clearance 10/20/21                                 Surgeon:  Dr. [name]  Surgeon's Group or Practice Name:  [facility] Phone number:  [phone] Fax number:  [phone] or [phone]   Type of Clearance Requested:   - Medical    Type of Anesthesia:  MAC   Additional requests/questions:   n/a  Signed, Kamira J Martinique   10/13/2021, 1:50 PM

## 2021-10-13 NOTE — Telephone Encounter (Signed)
° °  Name: Melinda Nguyen  DOB: 04-Jan-1961  MRN: [id number]   Primary Cardiologist: Dr. Brynda Peon  Chart reviewed as part of pre-operative protocol coverage. Patient was contacted 10/13/2021 in reference to pre-operative risk assessment for pending surgery as outlined below.  Sita Mangen was last seen on 06/2020 by Dr. Agustin Cree. History reviewed. Doing well at last OV 06/29/21. Last device interrogation 09/18/21 was normal. Repeat echo 09/27/21 normal EF, grade 1 DD. No significant valve issues.I reached out to patient for update on how she is doing. The patient affirms she has been doing well without any new cardiac symptoms or ICD discharges. Procedure under MAC is considered relatively low risk procedure. Therefore, based on ACC/AHA guidelines, the patient would be at acceptable risk for the planned procedure without further cardiovascular testing. The patient was advised that if she develops new symptoms prior to surgery to contact our office to arrange for a follow-up visit, and she verbalized understanding.  I will route this recommendation to the requesting party via Epic fax function and remove from pre-op pool. Please call with questions. Will route to device team so they are aware of upcoming procedure as well.  Charlie Pitter, PA-C 10/13/2021, 2:57 PM

## 2021-10-18 ENCOUNTER — Other Ambulatory Visit: Payer: Self-pay

## 2021-10-20 DIAGNOSIS — E113511 Type 2 diabetes mellitus with proliferative diabetic retinopathy with macular edema, right eye: Secondary | ICD-10-CM | POA: Diagnosis not present

## 2021-10-20 DIAGNOSIS — H35371 Puckering of macula, right eye: Secondary | ICD-10-CM | POA: Diagnosis not present

## 2021-10-20 DIAGNOSIS — H4311 Vitreous hemorrhage, right eye: Secondary | ICD-10-CM | POA: Diagnosis not present

## 2021-10-28 DIAGNOSIS — H35371 Puckering of macula, right eye: Secondary | ICD-10-CM | POA: Diagnosis not present

## 2021-10-28 DIAGNOSIS — E113511 Type 2 diabetes mellitus with proliferative diabetic retinopathy with macular edema, right eye: Secondary | ICD-10-CM | POA: Diagnosis not present

## 2021-10-31 ENCOUNTER — Ambulatory Visit (INDEPENDENT_AMBULATORY_CARE_PROVIDER_SITE_OTHER): Payer: BC Managed Care – PPO | Admitting: Cardiology

## 2021-10-31 ENCOUNTER — Encounter: Payer: Self-pay | Admitting: Cardiology

## 2021-10-31 ENCOUNTER — Other Ambulatory Visit: Payer: Self-pay

## 2021-10-31 VITALS — BP 142/70 | HR 96 | Ht 64.0 in | Wt 199.4 lb

## 2021-10-31 DIAGNOSIS — I472 Ventricular tachycardia, unspecified: Secondary | ICD-10-CM | POA: Diagnosis not present

## 2021-10-31 DIAGNOSIS — E7849 Other hyperlipidemia: Secondary | ICD-10-CM | POA: Diagnosis not present

## 2021-10-31 DIAGNOSIS — I25119 Atherosclerotic heart disease of native coronary artery with unspecified angina pectoris: Secondary | ICD-10-CM | POA: Diagnosis not present

## 2021-10-31 NOTE — Progress Notes (Signed)
Electrophysiology Office Note   Date:  10/31/2021   ID:  Melinda Nguyen, DOB 1961-08-11, MRN [id number]  PCP:  Blane Ohara, MD  Cardiologist:  Bing Matter Primary Electrophysiologist:  Regan Lemming, MD    Chief Complaint: VT   History of Present Illness: Melinda Nguyen is a 61 y.o. female who is being seen today for the evaluation of VT at the request of Cox, Fritzi Mandes, MD. Presenting today for electrophysiology evaluation.  She has a history significant for poorly controlled diabetes, obesity, tobacco abuse, CHF, hyperlipidemia, coronary artery disease.  She presented [facility] June 2020 with persistent nausea and vomiting.  She went to her primary physician's office and was noted to be in ventricular tachycardia.  She is status post [employer] Jude ICD implanted 03/13/2019.  Today, denies symptoms of palpitations, chest pain, shortness of breath, orthopnea, PND, lower extremity edema, claudication, dizziness, presyncope, syncope, bleeding, or neurologic sequela. The patient is tolerating medications without difficulties.  Feels well.  She has no chest pain or shortness of breath.  Is able to do all her daily activities without restriction.  She is overall happy with her control.  She recently had retina surgery.   Past Medical History:  Diagnosis Date   AICD (automatic cardioverter/defibrillator) present 03/13/2019   CAD (coronary artery disease)    CHF (congestive heart failure) (HCC)    Depression    Diabetes mellitus without complication (HCC)    GERD (gastroesophageal reflux disease)    Hypertension    Mixed hyperlipidemia    Myocardial infarction (HCC) 2020   Other hypotension    Smoking history 03/10/2019   Smokes 1/2 pack a day   Ventricular tachycardia    Past Surgical History:  Procedure Laterality Date   ABDOMINAL HYSTERECTOMY     ICD IMPLANT N/A 03/13/2019   Procedure: ICD IMPLANT;  Surgeon: Regan Lemming, MD;  Location: [facility] INV[facility] LAB;  Service:  Cardiovascular;  Laterality: N/A;   ICD IMPLANT  03/13/2019   LEFT HEART CATH AND CORONARY ANGIOGRAPHY N/A 03/12/2019   Procedure: LEFT HEART CATH AND CORONARY ANGIOGRAPHY;  Surgeon: Iran Ouch, MD;  Location: [facility];  Service: Cardiovascular;  Laterality: N/A;     Current Outpatient Medications  Medication Sig Dispense Refill   aspirin EC 81 MG tablet Take 1 tablet (81 mg total) by mouth daily. 90 tablet 3   carvedilol (COREG) 3.125 MG tablet TAKE 1 TABLET (3.125 MG TOTAL) BY MOUTH 2 (TWO) TIMES DAILY WITH A MEAL. 180 tablet 4   DULoxetine (CYMBALTA) 30 MG capsule Take 1 capsule (30 mg total) by mouth at bedtime. 90 capsule 0   furosemide (LASIX) 40 MG tablet TAKE 1 TABLET (40 MG) BY ORAL ROUTE DAILY (Patient taking differently: 40 mg daily.) 90 tablet 1   glucose blood test strip 1 each by Other route as needed. Test blood sugar 3 times daily (Patient taking differently: 1 each by Other route as needed for other (Glucose reading). Test blood sugar 3 times daily) 100 each 6   insulin glargine (LANTUS SOLOSTAR) 100 UNIT/ML Solostar Pen Inject 26 Units into the skin daily. INJECT 26 UNITS INTO THE SKIN AT BEDTIME. 27 mL 0   Insulin Pen Needle (B-D UF III MINI PEN NEEDLES) 31G X 5 MM MISC 2 g by Does not apply route 4 (four) times daily as needed. (Patient taking differently: 2 g by Does not apply route 4 (four) times daily as needed (For injectable meds).) 100 each 3  LORazepam (ATIVAN) 0.5 MG tablet Take 0.5 mg by mouth 2 (two) times daily as needed for anxiety.     losartan (COZAAR) 50 MG tablet Take 1 tablet (50 mg total) by mouth daily. 90 tablet 3   metFORMIN (GLUCOPHAGE) 1000 MG tablet Take 1 tablet (1,000 mg total) by mouth 2 (two) times daily with a meal. 180 tablet 1   nitroGLYCERIN (NITROSTAT) 0.4 MG SL tablet Place 1 tablet (0.4 mg total) under the tongue every 5 (five) minutes as needed. (Patient taking differently: Place 0.4 mg under the tongue every 5 (five) minutes  as needed for chest pain.) 25 tablet 12   ofloxacin (OCUFLOX) 0.3 % ophthalmic solution Place 1 drop into the right eye 4 (four) times daily.     omeprazole (PRILOSEC) 20 MG capsule Take 20 mg by mouth daily as needed (acid reflux).     prednisoLONE acetate (PRED FORTE) 1 % ophthalmic suspension Place 1 drop into the right eye 4 (four) times daily.     No current facility-administered medications for this visit.    Allergies:   Shrimp [shellfish allergy], Hydrocodone, Codeine, Crestor [rosuvastatin], Farxiga [dapagliflozin], Flagyl [metronidazole], Glimepiride, Jardiance [empagliflozin], Lipitor [atorvastatin], Victoza [liraglutide], and Zetia [ezetimibe]   Social History:  The patient  reports that she quit smoking about 2 years ago. Her smoking use included cigarettes. She smoked an average of .5 packs per day. She has never used smokeless tobacco. She reports that she does not currently use alcohol. She reports that she does not use drugs.   Family History:  The patient's family history includes COPD in her father; Diabetes in her mother; Hypertension in her mother.   ROS:  Please see the history of present illness.   Otherwise, review of systems is positive for none.   All other systems are reviewed and negative.   PHYSICAL EXAM: VS:  BP (!) 142/70    Pulse 96    Ht 5\' 4"  (1.626 m)    Wt 199 lb 6.4 oz (90.4 kg)    SpO2 96%    BMI 34.23 kg/m  , BMI Body mass index is 34.23 kg/m. GEN: Well nourished, well developed, in no acute distress  HEENT: normal  Neck: no JVD, carotid bruits, or masses Cardiac: RRR; no murmurs, rubs, or gallops,no edema  Respiratory:  clear to auscultation bilaterally, normal work of breathing GI: soft, nontender, nondistended, + BS MS: no deformity or atrophy  Skin: warm and dry, device site well healed Neuro:  Strength and sensation are intact Psych: euthymic mood, full affect  EKG:  EKG is ordered today. Personal review of the ekg ordered shows sinus  rhythm, rate 96  Personal review of the device interrogation today. Results in [place]: 06/23/2021: ALT 10; BUN 8; Creatinine, Ser 0.60; Hemoglobin 14.3; Platelets 269; Potassium 5.0; Sodium 140    Lipid Panel     Component Value Date/Time   CHOL 241 (H) 06/23/2021 0903   TRIG 155 (H) 06/23/2021 0903   HDL 63 06/23/2021 0903   CHOLHDL 3.8 06/23/2021 0903   CHOLHDL 3.7 03/11/2019 0101   VLDL 22 03/11/2019 0101   LDLCALC 150 (H) 06/23/2021 0903     Wt Readings from Last 3 Encounters:  10/31/21 199 lb 6.4 oz (90.4 kg)  06/29/21 193 lb 6.4 oz (87.7 kg)  06/23/21 196 lb (88.9 kg)      Other studies Reviewed: Additional studies/ records that were reviewed today include: TTE 01/13/2020 Review of the above records  today demonstrates:   1. Left ventricular ejection fraction, by estimation, is 50 to 55%. The  left ventricle has low normal function. Left ventricular endocardial  border not optimally defined to evaluate regional wall motion. There is  moderate concentric left ventricular  hypertrophy. Left ventricular diastolic parameters are consistent with  Grade I diastolic dysfunction (impaired relaxation).   2. Right ventricular systolic function is normal. The right ventricular  size is normal.   3. The mitral valve is normal in structure. No evidence of mitral valve  regurgitation. No evidence of mitral stenosis.   4. The aortic valve is tricuspid. Aortic valve regurgitation is not  visualized. No aortic stenosis is present.   5. The inferior vena cava is normal in size with greater than 50%  respiratory variability, suggesting right atrial pressure of 3 mmHg.   Left heart catheterization 03/12/2019 Prox Cx to Mid Cx lesion is 100% stenosed. Prox LAD lesion is 30% stenosed. Mid RCA lesion is 30% stenosed.   ASSESSMENT AND PLAN:  1.  Coronary artery disease with chronic stable angina: Has a chronically occluded circumflex with disease in the LAD  and RCA.   Currently on carvedilol 3.125 mg and as needed nitroglycerin.  No current chest pain  2.  Ventricular tachycardia: Status post [facility] ICD implanted June 2020.  Device functioning appropriately.  She has had some nonsustained ventricular tachycardia, but she does not wish to make any changes.  No changes at this time.  3.  Hyperlipidemia: Patient has refused statin.  Karrina Lye refer to pharmacy lipid clinic to discuss [place].  Current medicines are reviewed at length with the patient today.   The patient does not have concerns regarding her medicines.  The following changes were made today:  none  Labs/ tests ordered today include:  Orders Placed This Encounter  Procedures   AMB Referral to [facility] Pharm-D   EKG 12-Lead     Disposition:   FU with Arthurine Oleary 1 year  Signed, Keta [place]burgh Meredith Leeds, MD  10/31/2021 11:28 AM     [address] [place] [address] [phone] (office) [phone] (fax)

## 2021-10-31 NOTE — Patient Instructions (Signed)
Medication Instructions:  Your physician recommends that you continue on your current medications as directed. Please refer to the Current Medication list given to you today.  *If you need a refill on your cardiac medications before your next appointment, please call your pharmacy*   Lab Work: None ordered If you have labs (blood work) drawn today and your tests are completely normal, you will receive your results only by: [place] (if you have MyChart) OR A paper copy in the mail If you have any lab test that is abnormal or we need to change your treatment, we will call you to review the results.   Testing/Procedures: None ordered   Follow-Up: At [facility], you and your health needs are our priority.  As part of our continuing mission to provide you with exceptional heart care, we have created designated Provider Care Teams.  These Care Teams include your primary Cardiologist (physician) and Advanced Practice Providers (APPs -  Physician Assistants and Nurse Practitioners) who all work together to provide you with the care you need, when you need it.  We recommend signing up for the patient portal called "MyChart".  Sign up information is provided on this After Visit Summary.  MyChart is used to connect with patients for Virtual Visits (Telemedicine).  Patients are able to view lab/test results, encounter notes, upcoming appointments, etc.  Non-urgent messages can be sent to your provider as well.   To learn more about what you can do with MyChart, go to [email or web address].    Remote monitoring is used to monitor your Pacemaker or ICD from home. This monitoring reduces the number of office visits required to check your device to one time per year. It allows [place] to keep an eye on the functioning of your device to ensure it is working properly. You are scheduled for a device check from home on 12/19/2021. You may send your transmission at any time that day. If you have a  wireless device, the transmission will be sent automatically. After your physician reviews your transmission, you will receive a postcard with your next transmission date.  Your next appointment:   1 year(s)  The format for your next appointment:   In Person  Provider:   Allegra Lai, MD   You have been referred to pharmD to discuss hyperlipidemia control.   Thank you for choosing [facility]!!   Trinidad Curet, RN [phone]

## 2021-11-14 ENCOUNTER — Other Ambulatory Visit: Payer: Self-pay | Admitting: Family Medicine

## 2021-11-15 DIAGNOSIS — E113511 Type 2 diabetes mellitus with proliferative diabetic retinopathy with macular edema, right eye: Secondary | ICD-10-CM | POA: Diagnosis not present

## 2021-11-15 DIAGNOSIS — E113412 Type 2 diabetes mellitus with severe nonproliferative diabetic retinopathy with macular edema, left eye: Secondary | ICD-10-CM | POA: Diagnosis not present

## 2021-12-02 ENCOUNTER — Other Ambulatory Visit: Payer: Self-pay | Admitting: Family Medicine

## 2021-12-02 ENCOUNTER — Other Ambulatory Visit: Payer: Self-pay

## 2021-12-02 MED ORDER — CARVEDILOL 3.125 MG PO TABS: 3.1250 mg | ORAL_TABLET | Freq: Two times a day (BID) | ORAL | 0 refills | Status: DC

## 2021-12-02 MED ORDER — DULOXETINE HCL 30 MG PO CPEP: 30.0000 mg | ORAL_CAPSULE | Freq: Every day | ORAL | 0 refills | Status: DC

## 2021-12-02 NOTE — Telephone Encounter (Signed)
Refill sent to pharmacy.   

## 2021-12-19 ENCOUNTER — Ambulatory Visit (INDEPENDENT_AMBULATORY_CARE_PROVIDER_SITE_OTHER): Payer: BC Managed Care – PPO

## 2021-12-19 DIAGNOSIS — I255 Ischemic cardiomyopathy: Secondary | ICD-10-CM

## 2021-12-19 LAB — CUP PACEART REMOTE DEVICE CHECK
Battery Remaining Longevity: 76 mo
Battery Remaining Percentage: 75 %
Battery Voltage: 2.98 V
Brady Statistic RV Percent Paced: 0 %
Date Time Interrogation Session: 20230313053248
HighPow Impedance: 80 Ohm
HighPow Impedance: 80 Ohm
Implantable Lead Implant Date: 20200604
Implantable Lead Location: 753860
Implantable Pulse Generator Implant Date: 20200604
Lead Channel Impedance Value: 450 Ohm
Lead Channel Pacing Threshold Amplitude: 1 V
Lead Channel Pacing Threshold Pulse Width: 0.5 ms
Lead Channel Sensing Intrinsic Amplitude: 11.3 mV
Lead Channel Setting Pacing Amplitude: 2.5 V
Lead Channel Setting Pacing Pulse Width: 0.5 ms
Lead Channel Setting Sensing Sensitivity: 0.5 mV
Pulse Gen Serial Number: 9860889

## 2021-12-25 ENCOUNTER — Other Ambulatory Visit: Payer: Self-pay | Admitting: Family Medicine

## 2021-12-29 ENCOUNTER — Other Ambulatory Visit: Payer: Self-pay | Admitting: Family Medicine

## 2021-12-29 NOTE — Progress Notes (Signed)
Remote ICD transmission.   

## 2022-01-03 ENCOUNTER — Ambulatory Visit: Payer: 59 | Admitting: Cardiology

## 2022-01-04 NOTE — Progress Notes (Signed)
? ?Subjective:  ?Patient ID: Melinda Nguyen, female    DOB: 12/30/1960  Age: 61 y.o. MRN: [id number] ? ?Chief Complaint  ?Patient presents with  ? Diabetes  ? Hyperlipidemia  ? ? ?Diabetes:  ?Complications: Glomerulopathy ?Glucose checking: Patient checks three times a day ?Glucose logs: 80-120 in am, 120s during rest of the day.  ?Hypoglycemia: 80s feels shaky.  ?Most recent A1C: 8.4 ?Current medications: Metformin 1000 mg 1 tablet twice daily, Semglee 20 units before bed. If sugar is below 120 at night, holds evening metformin.  ?Foot checks: daily ? ?Hyperlipidemia: ?Current medications: Patient is currently not taking any medications for this. Patient is intolerant to statins and zetia. ? ?Hypertensive Heart Disease with Chronic Systolic Congestive Heart Failure: ?Complications: ?Current medications: Patient is currently taking Carvedilol 3.125 mg 1 tablet twice daily, Losartan 50 mg take 1/2 tablet daily. ? ?Depression, mild recurrent: Patient is currently taking Cymbalta 30 mg 1 tablet daily. ? ?GERD: Omeprazole 20 mg daily as needed.  ? ?Diet: fairly healthy. ?Exercise: none. ?  ?Current Outpatient Medications on File Prior to Visit  ?Medication Sig Dispense Refill  ? aspirin EC 81 MG tablet Take 1 tablet (81 mg total) by mouth daily. 90 tablet 3  ? carvedilol (COREG) 3.125 MG tablet TAKE 1 TABLET(3.125 MG) BY MOUTH TWICE DAILY WITH A MEAL 180 tablet 0  ? furosemide (LASIX) 40 MG tablet TAKE 1 TABLET (40 MG) BY ORAL ROUTE DAILY (Patient taking differently: 40 mg daily.) 90 tablet 1  ? glucose blood test strip 1 each by Other route as needed. Test blood sugar 3 times daily (Patient taking differently: 1 each by Other route as needed for other (Glucose reading). Test blood sugar 3 times daily) 100 each 6  ? Insulin Pen Needle (B-D UF III MINI PEN NEEDLES) 31G X 5 MM MISC 2 g by Does not apply route 4 (four) times daily as needed. (Patient taking differently: 2 g by Does not apply route 4 (four) times daily as  needed (For injectable meds).) 100 each 3  ? losartan (COZAAR) 50 MG tablet Take 1 tablet (50 mg total) by mouth daily. 90 tablet 3  ? metFORMIN (GLUCOPHAGE) 1000 MG tablet TAKE 1 TABLET(1000 MG) BY MOUTH TWICE DAILY WITH A MEAL 180 tablet 1  ? nitroGLYCERIN (NITROSTAT) 0.4 MG SL tablet Place 1 tablet (0.4 mg total) under the tongue every 5 (five) minutes as needed. (Patient taking differently: Place 0.4 mg under the tongue every 5 (five) minutes as needed for chest pain.) 25 tablet 12  ? omeprazole (PRILOSEC) 20 MG capsule Take 20 mg by mouth daily as needed (acid reflux).    ? SEMGLEE, YFGN, 100 UNIT/ML Pen INJECT 26 UNITS UNDER THE SKIN AS DIRECTED EVERY NIGHT AT BEDTIME 27 mL 0  ? ?No current facility-administered medications on file prior to visit.  ? ?Past Medical History:  ?Diagnosis Date  ? AICD (automatic cardioverter/defibrillator) present 03/13/2019  ? CAD (coronary artery disease)   ? CHF (congestive heart failure) (HCC)   ? Depression   ? Diabetes mellitus without complication (HCC)   ? GERD (gastroesophageal reflux disease)   ? Hypertension   ? Mixed hyperlipidemia   ? Myocardial infarction [facility]) 2020  ? Other hypotension   ? Smoking history 03/10/2019  ? Smokes 1/2 pack a day  ? Ventricular tachycardia (HCC)   ? ?Past Surgical History:  ?Procedure Laterality Date  ? ABDOMINAL HYSTERECTOMY    ? CATARACT EXTRACTION Bilateral   ? 2016-2017  ? EYE  SURGERY Right 10/2021  ? "gel leaking"/laser surgery  ? ICD IMPLANT N/A 03/13/2019  ? Procedure: ICD IMPLANT;  Surgeon: Regan Lemming, MD;  Location: [facility] INVASIVE CV LAB;  Service: Cardiovascular;  Laterality: N/A;  ? ICD IMPLANT  03/13/2019  ? LEFT HEART CATH AND CORONARY ANGIOGRAPHY N/A 03/12/2019  ? Procedure: LEFT HEART CATH AND CORONARY ANGIOGRAPHY;  Surgeon: Iran Ouch, MD;  Location: [facility] INVASIVE CV LAB;  Service: Cardiovascular;  Laterality: N/A;  ?  ?Family History  ?Problem Relation Age of Onset  ? Hypertension Mother   ? Diabetes Mother    ? COPD Father   ? ?Social History  ? ?Socioeconomic History  ? Marital status: Widowed  ?  Spouse name: Not on file  ? Number of children: Not on file  ? Years of education: Not on file  ? Highest education level: Not on file  ?Occupational History  ? Not on file  ?Tobacco Use  ? Smoking status: Former  ?  Packs/day: 0.50  ?  Types: Cigarettes  ?  Quit date: 03/2019  ?  Years since quitting: 2.8  ? Smokeless tobacco: Never  ?Vaping Use  ? Vaping Use: Never used  ?Substance and Sexual Activity  ? Alcohol use: Not Currently  ? Drug use: Never  ? Sexual activity: Not on file  ?Other Topics Concern  ? Not on file  ?Social History Narrative  ? Not on file  ? ?Social Determinants of Health  ? ?Financial Resource Strain: Not on file  ?Food Insecurity: Not on file  ?Transportation Needs: Not on file  ?Physical Activity: Not on file  ?Stress: Not on file  ?Social Connections: Not on file  ? ? ?Review of Systems  ?Constitutional:  Negative for appetite change, fatigue and fever.  ?HENT:  Positive for rhinorrhea. Negative for congestion, ear pain, sinus pressure and sore throat.   ?Respiratory:  Negative for cough, chest tightness, shortness of breath and wheezing.   ?Cardiovascular:  Negative for chest pain and palpitations.  ?Gastrointestinal:  Negative for abdominal pain, constipation, diarrhea, nausea and vomiting.  ?Endocrine: Negative for polydipsia, polyphagia and polyuria.  ?     Nocturia  ?Genitourinary:  Negative for dysuria and hematuria.  ?Musculoskeletal:  Negative for arthralgias, back pain, joint swelling and myalgias.  ?Skin:  Negative for rash.  ?Neurological:  Negative for dizziness, weakness and headaches.  ?Psychiatric/Behavioral:  Negative for dysphoric mood. The patient is not nervous/anxious.   ? ? ?Objective:  ?BP 128/68   Pulse 80   Temp (!) 96.5 ?F (35.8 ?C)   Resp 16   Ht 5\' 3"  (1.6 m)   Wt 195 lb (88.5 kg)   BMI 34.54 kg/m?  ? ? ?  01/09/2022  ? 10:56 AM 01/05/2022  ?  7:54 AM 10/31/2021  ? 10:55  AM  ?BP/Weight  ?Systolic BP 134 128 142  ?Diastolic BP 58 68 70  ?Wt. (Lbs) 195.8 195 199.4  ?BMI 34.68 kg/m2 34.54 kg/m2 34.23 kg/m2  ? ? ?Physical Exam ?Vitals reviewed.  ?Constitutional:   ?   Appearance: Normal appearance. She is normal weight.  ?Neck:  ?   Vascular: No carotid bruit.  ?Cardiovascular:  ?   Rate and Rhythm: Normal rate and regular rhythm.  ?   Heart sounds: Normal heart sounds.  ?Pulmonary:  ?   Effort: Pulmonary effort is normal. No respiratory distress.  ?   Breath sounds: Normal breath sounds.  ?Abdominal:  ?   General: Abdomen is flat. Bowel  sounds are normal.  ?   Palpations: Abdomen is soft.  ?   Tenderness: There is no abdominal tenderness.  ?Neurological:  ?   Mental Status: She is alert and oriented to person, place, and time.  ?Psychiatric:     ?   Mood and Affect: Mood normal.     ?   Behavior: Behavior normal.  ? ? ?Diabetic Foot Exam - Simple   ?Simple Foot Form ? 01/05/2022  2:43 PM  ?Visual Inspection ?No deformities, no ulcerations, no other skin breakdown bilaterally: Yes ?Sensation Testing ?Intact to touch and monofilament testing bilaterally: Yes ?Pulse Check ?Posterior Tibialis and Dorsalis pulse intact bilaterally: Yes ?Comments ?  ?  ? ?Lab Results  ?Component Value Date  ? WBC 8.4 01/05/2022  ? HGB 14.8 01/05/2022  ? HCT 43.6 01/05/2022  ? PLT 273 01/05/2022  ? GLUCOSE 136 (H) 01/05/2022  ? CHOL 178 01/05/2022  ? TRIG 144 01/05/2022  ? HDL 52 01/05/2022  ? LDLCALC 101 (H) 01/05/2022  ? ALT 10 01/05/2022  ? AST 10 01/05/2022  ? NA 137 01/05/2022  ? K 4.5 01/05/2022  ? CL 99 01/05/2022  ? CREATININE 0.60 01/05/2022  ? BUN 11 01/05/2022  ? CO2 26 01/05/2022  ? HGBA1C 7.6 (H) 01/05/2022  ? MICROALBUR 80 06/23/2021  ? ? ? ? ?Assessment & Plan:  ? ?Problem List Items Addressed This Visit   ? ?  ? Cardiovascular and Mediastinum  ? Coronary artery disease completely occluded circumflex artery based on cardiac cath in June 2020  ?  Continue carvedilol, losartan, aspirin 81 mg  daily,  ?  ?  ? Hypertensive heart disease with chronic systolic congestive heart failure (HCC)  ?  Well controlled.  ?No changes to medicines. Continue Carvedilol 3.125 mg 1 tablet twice daily, Losartan 50 mg take 1

## 2022-01-05 ENCOUNTER — Ambulatory Visit: Payer: BC Managed Care – PPO | Admitting: Family Medicine

## 2022-01-05 ENCOUNTER — Encounter: Payer: Self-pay | Admitting: Family Medicine

## 2022-01-05 ENCOUNTER — Ambulatory Visit: Payer: BC Managed Care – PPO

## 2022-01-05 VITALS — BP 128/68 | HR 80 | Temp 96.5°F | Resp 16 | Ht 63.0 in | Wt 195.0 lb

## 2022-01-05 DIAGNOSIS — T466X5A Adverse effect of antihyperlipidemic and antiarteriosclerotic drugs, initial encounter: Secondary | ICD-10-CM

## 2022-01-05 DIAGNOSIS — E119 Type 2 diabetes mellitus without complications: Secondary | ICD-10-CM

## 2022-01-05 DIAGNOSIS — I110 Hypertensive heart disease with heart failure: Secondary | ICD-10-CM | POA: Diagnosis not present

## 2022-01-05 DIAGNOSIS — M791 Myalgia, unspecified site: Secondary | ICD-10-CM

## 2022-01-05 DIAGNOSIS — I2510 Atherosclerotic heart disease of native coronary artery without angina pectoris: Secondary | ICD-10-CM

## 2022-01-05 DIAGNOSIS — I25119 Atherosclerotic heart disease of native coronary artery with unspecified angina pectoris: Secondary | ICD-10-CM

## 2022-01-05 DIAGNOSIS — E782 Mixed hyperlipidemia: Secondary | ICD-10-CM | POA: Diagnosis not present

## 2022-01-05 DIAGNOSIS — Z794 Long term (current) use of insulin: Secondary | ICD-10-CM

## 2022-01-05 DIAGNOSIS — F330 Major depressive disorder, recurrent, mild: Secondary | ICD-10-CM

## 2022-01-05 DIAGNOSIS — I5022 Chronic systolic (congestive) heart failure: Secondary | ICD-10-CM | POA: Diagnosis not present

## 2022-01-05 DIAGNOSIS — E1121 Type 2 diabetes mellitus with diabetic nephropathy: Secondary | ICD-10-CM

## 2022-01-05 DIAGNOSIS — G720 Drug-induced myopathy: Secondary | ICD-10-CM

## 2022-01-05 DIAGNOSIS — I252 Old myocardial infarction: Secondary | ICD-10-CM

## 2022-01-05 DIAGNOSIS — E1165 Type 2 diabetes mellitus with hyperglycemia: Secondary | ICD-10-CM

## 2022-01-05 MED ORDER — DULOXETINE HCL 30 MG PO CPEP: ORAL_CAPSULE | ORAL | 3 refills | Status: DC

## 2022-01-05 MED ORDER — MEMANTINE HCL 10 MG PO TABS: 10.0000 mg | ORAL_TABLET | Freq: Two times a day (BID) | ORAL | 3 refills | Status: DC

## 2022-01-05 MED ORDER — LORAZEPAM 0.5 MG PO TABS
0.5000 mg | ORAL_TABLET | Freq: Every day | ORAL | 0 refills | Status: DC | PRN
Start: 2022-01-05 — End: 2022-12-11

## 2022-01-05 NOTE — Patient Instructions (Signed)
Decrease metformin to 1000 mg once in am.  ?Continue Semglee insulin. ?

## 2022-01-06 LAB — CBC WITH DIFF/PLATELET
Basophils Absolute: 0.1 10*3/uL (ref 0.0–0.2)
Basos: 1 %
EOS (ABSOLUTE): 0.3 10*3/uL (ref 0.0–0.4)
Eos: 4 %
Hematocrit: 43.6 % (ref 34.0–46.6)
Hemoglobin: 14.8 g/dL (ref 11.1–15.9)
Immature Grans (Abs): 0 10*3/uL (ref 0.0–0.1)
Immature Granulocytes: 0 %
Lymphocytes Absolute: 2.3 10*3/uL (ref 0.7–3.1)
Lymphs: 28 %
MCH: 29.2 pg (ref 26.6–33.0)
MCHC: 33.9 g/dL (ref 31.5–35.7)
MCV: 86 fL (ref 79–97)
Monocytes Absolute: 0.5 10*3/uL (ref 0.1–0.9)
Monocytes: 6 %
Neutrophils Absolute: 5.2 10*3/uL (ref 1.4–7.0)
Neutrophils: 61 %
Platelets: 273 10*3/uL (ref 150–450)
RBC: 5.06 x10E6/uL (ref 3.77–5.28)
RDW: 13.2 % (ref 11.7–15.4)
WBC: 8.4 10*3/uL (ref 3.4–10.8)

## 2022-01-06 LAB — LIPID PANEL
Chol/HDL Ratio: 3.4 ratio (ref 0.0–4.4)
Cholesterol, Total: 178 mg/dL (ref 100–199)
HDL: 52 mg/dL (ref >39)
LDL Chol Calc (NIH): 101 mg/dL — ABNORMAL HIGH (ref 0–99)
Triglycerides: 144 mg/dL (ref 0–149)
VLDL Cholesterol Cal: 25 mg/dL (ref 5–40)

## 2022-01-06 LAB — MICROALBUMIN / CREATININE URINE RATIO
Creatinine, Urine: 50.3 mg/dL
Microalb/Creat Ratio: 284 mg/g creat — ABNORMAL HIGH (ref 0–29)
Microalbumin, Urine: 143.1 ug/mL

## 2022-01-06 LAB — COMPREHENSIVE METABOLIC PANEL
ALT: 10 IU/L (ref 0–32)
AST: 10 IU/L (ref 0–40)
Albumin/Globulin Ratio: 1.7 (ref 1.2–2.2)
Albumin: 4.3 g/dL (ref 3.8–4.8)
Alkaline Phosphatase: 124 IU/L — ABNORMAL HIGH (ref 44–121)
BUN/Creatinine Ratio: 18 (ref 12–28)
BUN: 11 mg/dL (ref 8–27)
Bilirubin Total: 0.3 mg/dL (ref 0.0–1.2)
CO2: 26 mmol/L (ref 20–29)
Calcium: 9.3 mg/dL (ref 8.7–10.3)
Chloride: 99 mmol/L (ref 96–106)
Creatinine, Ser: 0.6 mg/dL (ref 0.57–1.00)
Globulin, Total: 2.6 g/dL (ref 1.5–4.5)
Glucose: 136 mg/dL — ABNORMAL HIGH (ref 70–99)
Potassium: 4.5 mmol/L (ref 3.5–5.2)
Sodium: 137 mmol/L (ref 134–144)
Total Protein: 6.9 g/dL (ref 6.0–8.5)
eGFR: 101 mL/min/{1.73_m2} (ref >59)

## 2022-01-06 LAB — HEMOGLOBIN A1C
Est. average glucose Bld gHb Est-mCnc: 171 mg/dL
Hgb A1c MFr Bld: 7.6 % — ABNORMAL HIGH (ref 4.8–5.6)

## 2022-01-07 NOTE — Progress Notes (Signed)
Blood count normal.  ?Liver function normal.  ?Kidney function normal.  ?Cholesterol: improved, but not quite at goal. Recommend continue to work on eating healthy diet and exercise.  ?HBA1C: 7.6 improved, but not at goal of less than 7. Increase semglee to 24 U daily.  ?Spilling protein in urine. Recommend start kerendia 10 mg daily. Needs to recheck cmp in 1 month if agrees to start this. (The alternative is to increase losartan back up to 50 mg daily, but this was causing dizziness.

## 2022-01-09 ENCOUNTER — Ambulatory Visit (INDEPENDENT_AMBULATORY_CARE_PROVIDER_SITE_OTHER): Payer: BC Managed Care – PPO | Admitting: Cardiology

## 2022-01-09 ENCOUNTER — Encounter: Payer: Self-pay | Admitting: Cardiology

## 2022-01-09 VITALS — BP 134/58 | HR 92 | Ht 63.0 in | Wt 195.8 lb

## 2022-01-09 DIAGNOSIS — I2510 Atherosclerotic heart disease of native coronary artery without angina pectoris: Secondary | ICD-10-CM | POA: Diagnosis not present

## 2022-01-09 DIAGNOSIS — E119 Type 2 diabetes mellitus without complications: Secondary | ICD-10-CM

## 2022-01-09 DIAGNOSIS — E782 Mixed hyperlipidemia: Secondary | ICD-10-CM

## 2022-01-09 DIAGNOSIS — I472 Ventricular tachycardia, unspecified: Secondary | ICD-10-CM

## 2022-01-09 DIAGNOSIS — I255 Ischemic cardiomyopathy: Secondary | ICD-10-CM

## 2022-01-09 NOTE — Progress Notes (Signed)
?Cardiology Office Note:   ? ?Date:  01/09/2022  ? ?ID:  Melinda Nguyen, DOB [birth year], MRN [id number] ? ?PCP:  Rochel Brome, MD  ?Cardiologist:  Jenne Campus, MD   ? ?Referring MD: Rochel Brome, MD  ? ?No chief complaint on file. ?I am doing very well and feeling well ? ?History of Present Illness:   ? ?Melinda Nguyen is a 61 y.o. female with past medical history significant for coronary artery disease many years ago she had cardiac catheterization done which showed completely occluded circumflex artery, at that time she was also find to have cardiomyopathy with ejection fraction 40 to 45% she have history of ventricular tachycardia she does have ICD which is [employer] device, also dyslipidemia and diabetes. ?She is coming to my office for follow-up overall she is doing very well.  She denies have any chest pain tightness squeezing pressure burning chest no palpitations dizziness swelling of lower extremities. ? ?Past Medical History:  ?Diagnosis Date  ? AICD (automatic cardioverter/defibrillator) present 03/13/2019  ? CAD (coronary artery disease)   ? CHF (congestive heart failure) ([place])   ? Depression   ? Diabetes mellitus without complication ([place])   ? GERD (gastroesophageal reflux disease)   ? Hypertension   ? Mixed hyperlipidemia   ? Myocardial infarction [facility]) 2020  ? Other hypotension   ? Smoking history 03/10/2019  ? Smokes 1/2 pack a day  ? Ventricular tachycardia ([place])   ? ? ?Past Surgical History:  ?Procedure Laterality Date  ? ABDOMINAL HYSTERECTOMY    ? CATARACT EXTRACTION Bilateral   ? 2016-2017  ? EYE SURGERY Right 10/2021  ? "gel leaking"/laser surgery  ? ICD IMPLANT N/A 03/13/2019  ? Procedure: ICD IMPLANT;  Surgeon: Constance Haw, MD;  Location: [place] CV LAB;  Service: Cardiovascular;  Laterality: N/A;  ? ICD IMPLANT  03/13/2019  ? LEFT HEART CATH AND CORONARY ANGIOGRAPHY N/A 03/12/2019  ? Procedure: LEFT HEART CATH AND CORONARY ANGIOGRAPHY;  Surgeon: Wellington Hampshire, MD;  Location: [place] CV LAB;  Service: Cardiovascular;  Laterality: N/A;  ? ? ?Current Medications: ?Current Meds  ?Medication Sig  ? aspirin EC 81 MG tablet Take 1 tablet (81 mg total) by mouth daily.  ? carvedilol (COREG) 3.125 MG tablet TAKE 1 TABLET(3.125 MG) BY MOUTH TWICE DAILY WITH A MEAL  ? DULoxetine (CYMBALTA) 30 MG capsule TAKE 1 CAPSULE(30 MG) BY MOUTH AT BEDTIME  ? furosemide (LASIX) 40 MG tablet TAKE 1 TABLET (40 MG) BY ORAL ROUTE DAILY (Patient taking differently: 40 mg daily.)  ? glucose blood test strip 1 each by Other route as needed. Test blood sugar 3 times daily (Patient taking differently: 1 each by Other route as needed for other (Glucose reading). Test blood sugar 3 times daily)  ? Insulin Pen Needle (B-D UF III MINI PEN NEEDLES) 31G X 5 MM MISC 2 g by Does not apply route 4 (four) times daily as needed. (Patient taking differently: 2 g by Does not apply route 4 (four) times daily as needed (For injectable meds).)  ? LORazepam (ATIVAN) 0.5 MG tablet Take 1 tablet (0.5 mg total) by mouth daily as needed for anxiety.  ? losartan (COZAAR) 50 MG tablet Take 1 tablet (50 mg total) by mouth daily.  ? metFORMIN (GLUCOPHAGE) 1000 MG tablet TAKE 1 TABLET(1000 MG) BY MOUTH TWICE DAILY WITH A MEAL  ? nitroGLYCERIN (NITROSTAT) 0.4 MG SL tablet Place 1 tablet (0.4 mg total) under the tongue every 5 (five) minutes as needed. (Patient  taking differently: Place 0.4 mg under the tongue every 5 (five) minutes as needed for chest pain.)  ? omeprazole (PRILOSEC) 20 MG capsule Take 20 mg by mouth daily as needed (acid reflux).  ? SEMGLEE, YFGN, 100 UNIT/ML Pen INJECT 26 UNITS UNDER THE SKIN AS DIRECTED EVERY NIGHT AT BEDTIME  ?  ? ?Allergies:   Shrimp [shellfish allergy], Hydrocodone, Codeine, Crestor [rosuvastatin], Farxiga [dapagliflozin], Flagyl [metronidazole], Glimepiride, Jardiance [empagliflozin], Lipitor [atorvastatin], Ozempic (0.25 or 0.5 mg-dose) [semaglutide(0.25 or 0.5mg -dos)], Victoza [liraglutide], and Zetia  [ezetimibe]  ? ?Social History  ? ?Socioeconomic History  ? Marital status: Widowed  ?  Spouse name: Not on file  ? Number of children: Not on file  ? Years of education: Not on file  ? Highest education level: Not on file  ?Occupational History  ? Not on file  ?Tobacco Use  ? Smoking status: Former  ?  Packs/day: 0.50  ?  Types: Cigarettes  ?  Quit date: 03/2019  ?  Years since quitting: 2.8  ? Smokeless tobacco: Never  ?Vaping Use  ? Vaping Use: Never used  ?Substance and Sexual Activity  ? Alcohol use: Not Currently  ? Drug use: Never  ? Sexual activity: Not on file  ?Other Topics Concern  ? Not on file  ?Social History Narrative  ? Not on file  ? ?Social Determinants of Health  ? ?Financial Resource Strain: Not on file  ?Food Insecurity: Not on file  ?Transportation Needs: Not on file  ?Physical Activity: Not on file  ?Stress: Not on file  ?Social Connections: Not on file  ?  ? ?Family History: ?The patient's family history includes COPD in her father; Diabetes in her mother; Hypertension in her mother. ?ROS:   ?Please see the history of present illness.    ?All 14 point review of systems negative except as described per history of present illness ? ?EKGs/Labs/Other Studies Reviewed:   ? ? ? ?Recent Labs: ?01/05/2022: ALT 10; BUN 11; Creatinine, Ser 0.60; Hemoglobin 14.8; Platelets 273; Potassium 4.5; Sodium 137  ?Recent Lipid Panel ?   ?Component Value Date/Time  ? CHOL 178 01/05/2022 0910  ? TRIG 144 01/05/2022 0910  ? HDL 52 01/05/2022 0910  ? CHOLHDL 3.4 01/05/2022 0910  ? CHOLHDL 3.7 03/11/2019 0101  ? VLDL 22 03/11/2019 0101  ? LDLCALC 101 (H) 01/05/2022 0910  ? ? ?Physical Exam:   ? ?VS:  BP (!) 134/58 (BP Location: Right Arm)   Pulse 92   Ht 5\' 3"  (1.6 m)   Wt 195 lb 12.8 oz (88.8 kg)   SpO2 100%   BMI 34.68 kg/m?    ? ?Wt Readings from Last 3 Encounters:  ?01/09/22 195 lb 12.8 oz (88.8 kg)  ?01/05/22 195 lb (88.5 kg)  ?10/31/21 199 lb 6.4 oz (90.4 kg)  ?  ? ?GEN:  Well nourished, well developed in  no acute distress ?HEENT: Normal ?NECK: No JVD; No carotid bruits ?LYMPHATICS: No lymphadenopathy ?CARDIAC: RRR, no murmurs, no rubs, no gallops ?RESPIRATORY:  Clear to auscultation without rales, wheezing or rhonchi  ?ABDOMEN: Soft, non-tender, non-distended ?MUSCULOSKELETAL:  No edema; No deformity  ?SKIN: Warm and dry ?LOWER EXTREMITIES: no swelling ?NEUROLOGIC:  Alert and oriented x 3 ?PSYCHIATRIC:  Normal affect  ? ?ASSESSMENT:   ? ?1. Ischemic cardiomyopathy   ?2. Coronary artery disease involving native coronary artery of native heart without angina pectoris   ?3. Ventricular tachycardia ([place])   ?4. Diabetes mellitus without complication ([place])   ?5. Mixed hyperlipidemia   ? ?  PLAN:   ? ?In order of problems listed above: ? ?Ischemic cardiomyopathy echocardiogram reviewed showed preserved left ventricle ejection fraction.  Echo was done in December. ?Coronary disease stable on appropriate medications which I will continue.  She is on antiplatelet therapy. ?Dyslipidemia still uncontrolled I did review K PN which show LDL 101 HDL 52.  I told her that because of coronary artery disease as well as because of diabetes she need to be taken at least moderate intensity statin.  She does not want to do it she is afraid of this medication she prefers to simply go with diet with exercise which she can try for a little bit longer however ultimate goal will be to put her on statin or similar class of medications. ?History of ventricular tachycardia I did review interrogation of her device there were no detected arrhythmias. ?ICD present, I did review interrogation normal function. ? ? ?Medication Adjustments/Labs and Tests Ordered: ?Current medicines are reviewed at length with the patient today.  Concerns regarding medicines are outlined above.  ?No orders of the defined types were placed in this encounter. ? ?Medication changes: No orders of the defined types were placed in this encounter. ? ? ?Signed, ?Park Liter, MD, [facility] ?01/09/2022 11:11 AM    ?[place] ?

## 2022-01-09 NOTE — Patient Instructions (Signed)

## 2022-01-11 DIAGNOSIS — H35371 Puckering of macula, right eye: Secondary | ICD-10-CM | POA: Diagnosis not present

## 2022-01-11 DIAGNOSIS — E113592 Type 2 diabetes mellitus with proliferative diabetic retinopathy without macular edema, left eye: Secondary | ICD-10-CM | POA: Diagnosis not present

## 2022-01-11 DIAGNOSIS — E113511 Type 2 diabetes mellitus with proliferative diabetic retinopathy with macular edema, right eye: Secondary | ICD-10-CM | POA: Diagnosis not present

## 2022-01-19 ENCOUNTER — Encounter: Payer: Self-pay | Admitting: Family Medicine

## 2022-01-19 NOTE — Assessment & Plan Note (Signed)
Intolerant to statins. 

## 2022-01-19 NOTE — Assessment & Plan Note (Addendum)
Control: fair. ?Recommend check sugars fasting daily. ?Recommend check feet daily. ?Recommend annual eye exams. ?Medicines: Continue Metformin 1000 mg 1 tablet twice daily, increase Semglee 24 units before bed. Increase losartan to 50 mg daily.  ?Continue to work on eating a healthy diet and exercise.  ?Labs drawn today.   ? ?

## 2022-01-19 NOTE — Assessment & Plan Note (Addendum)
Well controlled.  ?No changes to medicines. Continue Carvedilol 3.125 mg 1 tablet twice daily, Losartan 50 mg take 1/2 tablet daily. Continue lasix 40 mg daily.  ?Continue to work on eating a healthy diet and exercise.  ?Labs drawn today.  ? ?

## 2022-01-19 NOTE — Assessment & Plan Note (Signed)
The current medical regimen is effective;  continue present plan and medications.  

## 2022-01-19 NOTE — Assessment & Plan Note (Signed)
Continue carvedilol, losartan, aspirin 81 mg daily,  ?

## 2022-01-19 NOTE — Assessment & Plan Note (Signed)
Not at goal. Refuses statins.  Continue to work on eating a healthy diet and exercise.  Labs drawn today.   

## 2022-02-27 ENCOUNTER — Other Ambulatory Visit: Payer: Self-pay | Admitting: Family Medicine

## 2022-03-27 ENCOUNTER — Ambulatory Visit (INDEPENDENT_AMBULATORY_CARE_PROVIDER_SITE_OTHER): Payer: BC Managed Care – PPO

## 2022-03-27 DIAGNOSIS — I255 Ischemic cardiomyopathy: Secondary | ICD-10-CM | POA: Diagnosis not present

## 2022-03-30 LAB — CUP PACEART REMOTE DEVICE CHECK
Battery Remaining Longevity: 73 mo
Battery Remaining Percentage: 72 %
Battery Voltage: 2.98 V
Brady Statistic RV Percent Paced: 0 %
Date Time Interrogation Session: 20230619020638
HighPow Impedance: 81 Ohm
HighPow Impedance: 81 Ohm
Implantable Lead Implant Date: 20200604
Implantable Lead Location: 753860
Implantable Pulse Generator Implant Date: 20200604
Lead Channel Impedance Value: 440 Ohm
Lead Channel Pacing Threshold Amplitude: 1 V
Lead Channel Pacing Threshold Pulse Width: 0.5 ms
Lead Channel Sensing Intrinsic Amplitude: 11.3 mV
Lead Channel Setting Pacing Amplitude: 2.5 V
Lead Channel Setting Pacing Pulse Width: 0.5 ms
Lead Channel Setting Sensing Sensitivity: 0.5 mV
Pulse Gen Serial Number: 9860889

## 2022-04-14 NOTE — Progress Notes (Signed)
Remote ICD transmission.   

## 2022-04-25 NOTE — Progress Notes (Signed)
Subjective:  Patient ID: Melinda Nguyen, female    DOB: 06/27/1961  Age: 61 y.o. MRN: [id number]  Chief Complaint  Patient presents with   Diabetes   Hyperlipidemia    HPI Diabetes:  Complications: Glomerulopathy Glucose checking: Patient checks two times a day Glucose logs: 80-90s, 140-200s in pm based on what she ate.  Hypoglycemia: 80s feels shaky.  Most recent A1C: 7.6 Current medications: Metformin 1000 mg 1 tablet twice daily, Semglee 20 units before bed. If sugar is below 150 at night, holds evening metformin.  Foot checks: daily Eye visit: due in October 2023.   Hyperlipidemia: Current medications: Patient is currently not taking any medications for this. Patient is intolerant to statins and zetia.  Hypertensive Heart Disease with Chronic Systolic Congestive Heart Failure: Current medications: Patient is currently taking Carvedilol 3.125 mg 1 tablet twice daily, Losartan 50 mg take 1/2 tablet daily, baby aspirin 81 mg daily,   Depression, mild recurrent: Patient is currently taking Cymbalta 30 mg 1 tablet daily.     04/26/2022    8:44 AM 01/05/2022    7:55 AM 06/23/2021    7:46 AM  PHQ9 SCORE ONLY  PHQ-9 Total Score 2 0 0    GERD: Omeprazole 20 mg daily as needed.   Diet: fairly healthy. Exercise: walking some at beach.   Current Outpatient Medications on File Prior to Visit  Medication Sig Dispense Refill   aspirin EC 81 MG tablet Take 1 tablet (81 mg total) by mouth daily. 90 tablet 3   carvedilol (COREG) 3.125 MG tablet TAKE 1 TABLET(3.125 MG) BY MOUTH TWICE DAILY WITH A MEAL 180 tablet 0   DULoxetine (CYMBALTA) 30 MG capsule TAKE 1 CAPSULE(30 MG) BY MOUTH AT BEDTIME 90 capsule 3   glucose blood test strip 1 each by Other route as needed. Test blood sugar 3 times daily (Patient taking differently: 1 each by Other route as needed for other (Glucose reading). Test blood sugar 3 times daily) 100 each 6   Insulin Pen Needle (B-D UF III MINI PEN NEEDLES) 31G X 5 MM  MISC 2 g by Does not apply route 4 (four) times daily as needed. (Patient taking differently: 2 g by Does not apply route 4 (four) times daily as needed (For injectable meds).) 100 each 3   LORazepam (ATIVAN) 0.5 MG tablet Take 1 tablet (0.5 mg total) by mouth daily as needed for anxiety. 30 tablet 0   losartan (COZAAR) 50 MG tablet Take 1 tablet (50 mg total) by mouth daily. 90 tablet 3   metFORMIN (GLUCOPHAGE) 1000 MG tablet TAKE 1 TABLET(1000 MG) BY MOUTH TWICE DAILY WITH A MEAL 180 tablet 1   nitroGLYCERIN (NITROSTAT) 0.4 MG SL tablet Place 1 tablet (0.4 mg total) under the tongue every 5 (five) minutes as needed. (Patient taking differently: Place 0.4 mg under the tongue every 5 (five) minutes as needed for chest pain.) 25 tablet 12   omeprazole (PRILOSEC) 20 MG capsule Take 20 mg by mouth daily as needed (acid reflux).     SEMGLEE, YFGN, 100 UNIT/ML Pen INJECT 26 UNITS UNDER THE SKIN AS DIRECTED EVERY NIGHT AT BEDTIME (Patient taking differently: 22-26 Units daily.) 27 mL 0   No current facility-administered medications on file prior to visit.   Past Medical History:  Diagnosis Date   AICD (automatic cardioverter/defibrillator) present 03/13/2019   CAD (coronary artery disease)    CHF (congestive heart failure) (HCC)    Depression    Diabetes mellitus without complication (HCC)  GERD (gastroesophageal reflux disease)    Hypertension    Mixed hyperlipidemia    Myocardial infarction [facility]) 2020   Other hypotension    Smoking history 03/10/2019   Smokes 1/2 pack a day   Ventricular tachycardia [facility])    Past Surgical History:  Procedure Laterality Date   ABDOMINAL HYSTERECTOMY     CATARACT EXTRACTION Bilateral    2016-2017   EYE SURGERY Right 10/2021   "gel leaking"/laser surgery   ICD IMPLANT N/A 03/13/2019   Procedure: ICD IMPLANT;  Surgeon: Regan Lemming, MD;  Location: [facility] INVASIVE CV LAB;  Service: Cardiovascular;  Laterality: N/A;   ICD IMPLANT  03/13/2019   LEFT  HEART CATH AND CORONARY ANGIOGRAPHY N/A 03/12/2019   Procedure: LEFT HEART CATH AND CORONARY ANGIOGRAPHY;  Surgeon: Iran Ouch, MD;  Location: [facility] INVASIVE CV LAB;  Service: Cardiovascular;  Laterality: N/A;    Family History  Problem Relation Age of Onset   Hypertension Mother    Diabetes Mother    COPD Father    Social History   Socioeconomic History   Marital status: Widowed    Spouse name: Not on file   Number of children: Not on file   Years of education: Not on file   Highest education level: Not on file  Occupational History   Not on file  Tobacco Use   Smoking status: Former    Packs/day: 0.50    Types: Cigarettes    Quit date: 03/2019    Years since quitting: 3.1   Smokeless tobacco: Never  Vaping Use   Vaping Use: Never used  Substance and Sexual Activity   Alcohol use: Not Currently   Drug use: Never   Sexual activity: Not on file  Other Topics Concern   Not on file  Social History Narrative   Not on file   Social Determinants of Health   Financial Resource Strain: Not on file  Food Insecurity: Not on file  Transportation Needs: Not on file  Physical Activity: Not on file  Stress: Not on file  Social Connections: Not on file    Review of Systems  Constitutional:  Negative for appetite change, fatigue and fever.  HENT:  Negative for congestion, ear pain, sinus pressure and sore throat.   Respiratory:  Negative for cough, shortness of breath and wheezing.   Cardiovascular:  Negative for chest pain and palpitations.  Gastrointestinal:  Negative for abdominal pain, constipation, diarrhea, nausea and vomiting.  Genitourinary:  Negative for dysuria and frequency.  Musculoskeletal:  Negative for arthralgias, back pain, joint swelling and myalgias.  Skin:  Negative for rash.  Neurological:  Negative for dizziness, weakness and headaches.  Psychiatric/Behavioral:  Negative for dysphoric mood. The patient is not nervous/anxious.      Objective:  BP  122/62   Pulse 84   Temp (!) 97 F (36.1 C)   Resp 14   Ht 5\' 3"  (1.6 m)   Wt 193 lb (87.5 kg)   BMI 34.19 kg/m      04/26/2022    8:55 AM 04/26/2022    8:03 AM 01/09/2022   10:56 AM  BP/Weight  Systolic BP 122 144 134  Diastolic BP 62 60 58  Wt. (Lbs)  193 195.8  BMI  34.19 kg/m2 34.68 kg/m2    Physical Exam Vitals reviewed.  Constitutional:      Appearance: Normal appearance. She is normal weight.  HENT:     Right Ear: Tympanic membrane normal.     Left  Ear: Tympanic membrane normal.     Nose: Nose normal.  Cardiovascular:     Rate and Rhythm: Normal rate and regular rhythm.     Pulses: Normal pulses.     Heart sounds: Normal heart sounds.  Pulmonary:     Effort: Pulmonary effort is normal.     Breath sounds: Normal breath sounds.  Abdominal:     General: Bowel sounds are normal.     Tenderness: There is no abdominal tenderness.  Neurological:     Mental Status: She is oriented to person, place, and time. Mental status is at baseline.  Psychiatric:        Mood and Affect: Mood normal.        Behavior: Behavior normal.     Diabetic Foot Exam - Simple   Simple Foot Form  04/26/2022  4:27 PM  Visual Inspection No deformities, no ulcerations, no other skin breakdown bilaterally: Yes Sensation Testing Intact to touch and monofilament testing bilaterally: Yes Pulse Check Posterior Tibialis and Dorsalis pulse intact bilaterally: Yes Comments      Lab Results  Component Value Date   WBC 9.2 04/26/2022   HGB 14.1 04/26/2022   HCT 43.1 04/26/2022   PLT 278 04/26/2022   GLUCOSE 117 (H) 04/26/2022   CHOL 187 04/26/2022   TRIG 136 04/26/2022   HDL 57 04/26/2022   LDLCALC 106 (H) 04/26/2022   ALT 11 04/26/2022   AST 14 04/26/2022   NA 138 04/26/2022   K 4.9 04/26/2022   CL 100 04/26/2022   CREATININE 0.64 04/26/2022   BUN 12 04/26/2022   CO2 25 04/26/2022   TSH 2.440 04/26/2022   HGBA1C 7.7 (H) 04/26/2022   MICROALBUR 80 06/23/2021      Assessment  & Plan:   Problem List Items Addressed This Visit       Cardiovascular and Mediastinum   Coronary artery disease completely occluded circumflex artery based on cardiac cath in June 2020 - Primary    Continue Carvedilol 3.125 mg 1 tablet twice daily, Losartan 50 mg take 1/2 tablet daily, aspirin 81 mg daily.       Relevant Orders   CBC with Differential (Completed)   Comprehensive metabolic panel (Completed)   Lipid Panel (Completed)   Hypertensive heart disease with chronic systolic congestive heart failure (HCC)    The current medical regimen is effective;  continue present plan and medications.         Endocrine   Diabetic glomerulopathy (HCC)    Control: fair  Recommend check sugars fasting daily. Recommend check feet daily. Recommend annual eye exams. Medicines:  Metformin 1000 mg 1 tablet twice daily, Semglee 20 units before bed. If sugar is below 150 at night, holds evening metformin.  Continue to work on eating a healthy diet and exercise.  Labs drawn today.         Relevant Orders   Comprehensive metabolic panel (Completed)   Hemoglobin A1c (Completed)   Microalbumin / creatinine urine ratio (Completed)     Other   Class 1 obesity due to excess calories with serious comorbidity and body mass index (BMI) of 33.0 to 33.9 in adult    Recommend continue to work on eating healthy diet and exercise.       Mixed hyperlipidemia    Fair control.  Refuses statins.  Continue to work on eating a healthy diet and exercise.  Labs drawn today.       Relevant Orders   CBC with Differential (Completed)  Comprehensive metabolic panel (Completed)   Lipid Panel (Completed)   TSH (Completed)   Other Visit Diagnoses     Hypertensive heart disease with chronic combined systolic and diastolic congestive heart failure (HCC)       Relevant Orders   Comprehensive metabolic panel (Completed)     .  Orders Placed This Encounter  Procedures   CBC with Differential    Comprehensive metabolic panel   Lipid Panel   Hemoglobin A1c   Microalbumin / creatinine urine ratio   TSH   Cardiovascular Risk Assessment     Follow-up: Return in about 3 months (around 07/27/2022) for chronic fasting.  An After Visit Summary was printed and given to the patient.  Blane Ohara, MD [facility] [phone]

## 2022-04-26 ENCOUNTER — Ambulatory Visit: Payer: BC Managed Care – PPO | Admitting: Family Medicine

## 2022-04-26 VITALS — BP 122/62 | HR 84 | Temp 97.0°F | Resp 14 | Ht 63.0 in | Wt 193.0 lb

## 2022-04-26 DIAGNOSIS — I2510 Atherosclerotic heart disease of native coronary artery without angina pectoris: Secondary | ICD-10-CM

## 2022-04-26 DIAGNOSIS — I5022 Chronic systolic (congestive) heart failure: Secondary | ICD-10-CM

## 2022-04-26 DIAGNOSIS — E1121 Type 2 diabetes mellitus with diabetic nephropathy: Secondary | ICD-10-CM

## 2022-04-26 DIAGNOSIS — E782 Mixed hyperlipidemia: Secondary | ICD-10-CM | POA: Diagnosis not present

## 2022-04-26 DIAGNOSIS — I5042 Chronic combined systolic (congestive) and diastolic (congestive) heart failure: Secondary | ICD-10-CM

## 2022-04-26 DIAGNOSIS — I110 Hypertensive heart disease with heart failure: Secondary | ICD-10-CM

## 2022-04-26 DIAGNOSIS — E6609 Other obesity due to excess calories: Secondary | ICD-10-CM | POA: Diagnosis not present

## 2022-04-26 DIAGNOSIS — Z6833 Body mass index (BMI) 33.0-33.9, adult: Secondary | ICD-10-CM

## 2022-04-26 NOTE — Assessment & Plan Note (Signed)
Control: fair  Recommend check sugars fasting daily. Recommend check feet daily. Recommend annual eye exams. Medicines:  Metformin 1000 mg 1 tablet twice daily, Semglee 20 units before bed. If sugar is below 150 at night, holds evening metformin.  Continue to work on eating a healthy diet and exercise.  Labs drawn today.

## 2022-04-26 NOTE — Assessment & Plan Note (Signed)
Continue Carvedilol 3.125 mg 1 tablet twice daily, Losartan 50 mg take 1/2 tablet daily, aspirin 81 mg daily.

## 2022-04-26 NOTE — Assessment & Plan Note (Signed)
Fair control.  Refuses statins.  Continue to work on eating a healthy diet and exercise.  Labs drawn today.

## 2022-04-26 NOTE — Assessment & Plan Note (Signed)
Recommend continue to work on eating healthy diet and exercise.  

## 2022-04-27 LAB — CBC WITH DIFFERENTIAL/PLATELET
Basophils Absolute: 0.1 x10E3/uL (ref 0.0–0.2)
Basos: 1 %
EOS (ABSOLUTE): 0.2 x10E3/uL (ref 0.0–0.4)
Eos: 2 %
Hematocrit: 43.1 % (ref 34.0–46.6)
Hemoglobin: 14.1 g/dL (ref 11.1–15.9)
Immature Grans (Abs): 0 x10E3/uL (ref 0.0–0.1)
Immature Granulocytes: 0 %
Lymphocytes Absolute: 2.6 x10E3/uL (ref 0.7–3.1)
Lymphs: 28 %
MCH: 27.9 pg (ref 26.6–33.0)
MCHC: 32.7 g/dL (ref 31.5–35.7)
MCV: 85 fL (ref 79–97)
Monocytes Absolute: 0.6 x10E3/uL (ref 0.1–0.9)
Monocytes: 7 %
Neutrophils Absolute: 5.7 x10E3/uL (ref 1.4–7.0)
Neutrophils: 62 %
Platelets: 278 x10E3/uL (ref 150–450)
RBC: 5.05 x10E6/uL (ref 3.77–5.28)
RDW: 13.3 % (ref 11.7–15.4)
WBC: 9.2 x10E3/uL (ref 3.4–10.8)

## 2022-04-27 LAB — COMPREHENSIVE METABOLIC PANEL
ALT: 11 IU/L (ref 0–32)
AST: 14 IU/L (ref 0–40)
Albumin/Globulin Ratio: 1.5 (ref 1.2–2.2)
Albumin: 4.1 g/dL (ref 3.9–4.9)
Alkaline Phosphatase: 130 IU/L — ABNORMAL HIGH (ref 44–121)
BUN/Creatinine Ratio: 19 (ref 12–28)
BUN: 12 mg/dL (ref 8–27)
Bilirubin Total: 0.4 mg/dL (ref 0.0–1.2)
CO2: 25 mmol/L (ref 20–29)
Calcium: 9.4 mg/dL (ref 8.7–10.3)
Chloride: 100 mmol/L (ref 96–106)
Creatinine, Ser: 0.64 mg/dL (ref 0.57–1.00)
Globulin, Total: 2.7 g/dL (ref 1.5–4.5)
Glucose: 117 mg/dL — ABNORMAL HIGH (ref 70–99)
Potassium: 4.9 mmol/L (ref 3.5–5.2)
Sodium: 138 mmol/L (ref 134–144)
Total Protein: 6.8 g/dL (ref 6.0–8.5)
eGFR: 100 mL/min/{1.73_m2} (ref >59)

## 2022-04-27 LAB — LIPID PANEL
Chol/HDL Ratio: 3.3 ratio (ref 0.0–4.4)
Cholesterol, Total: 187 mg/dL (ref 100–199)
HDL: 57 mg/dL
LDL Chol Calc (NIH): 106 mg/dL — ABNORMAL HIGH (ref 0–99)
Triglycerides: 136 mg/dL (ref 0–149)
VLDL Cholesterol Cal: 24 mg/dL (ref 5–40)

## 2022-04-27 LAB — MICROALBUMIN / CREATININE URINE RATIO
Creatinine, Urine: 65.9 mg/dL
Microalb/Creat Ratio: 80 mg/g{creat} — ABNORMAL HIGH (ref 0–29)
Microalbumin, Urine: 53 ug/mL

## 2022-04-27 LAB — HEMOGLOBIN A1C
Est. average glucose Bld gHb Est-mCnc: 174 mg/dL
Hgb A1c MFr Bld: 7.7 % — ABNORMAL HIGH (ref 4.8–5.6)

## 2022-04-27 LAB — TSH: TSH: 2.44 u[IU]/mL (ref 0.450–4.500)

## 2022-04-29 NOTE — Progress Notes (Signed)
Blood count normal.  Liver function normal.  Kidney function normal.  Thyroid function normal.  Cholesterol: LDL elevated at 106. HBA1C: 7.7.Recommend increase semglee insulin to 34 U daily.  Spilling protein in urine.Greatly improved. Recommend increase losartan to 100 mg daily.

## 2022-04-30 ENCOUNTER — Encounter: Payer: Self-pay | Admitting: Family Medicine

## 2022-04-30 NOTE — Assessment & Plan Note (Signed)
The current medical regimen is effective;  continue present plan and medications.  

## 2022-06-01 ENCOUNTER — Telehealth: Payer: Self-pay | Admitting: Cardiology

## 2022-06-01 MED ORDER — NITROGLYCERIN 0.4 MG SL SUBL: 0.4000 mg | SUBLINGUAL_TABLET | SUBLINGUAL | 12 refills | Status: AC | PRN

## 2022-06-01 NOTE — Telephone Encounter (Signed)
*  STAT* If patient is at the pharmacy, call can be transferred to refill team.   1. Which medications need to be refilled? (please list name of each medication and dose if known)   nitroGLYCERIN (NITROSTAT) 0.4 MG SL tablet    2. Which pharmacy/location (including street and city if local pharmacy) is medication to be sent to?  [facility] [phone] - [place], [place] - [address]. Do they need a 30 day or 90 day supply?  90 day

## 2022-06-13 ENCOUNTER — Telehealth: Payer: Self-pay

## 2022-06-13 NOTE — Patient Outreach (Signed)
  Care Coordination   06/13/2022 Name: Melinda Nguyen MRN: [id number] DOB: 1961-09-06   Care Coordination Outreach Attempts:  An unsuccessful telephone outreach was attempted today to offer the patient information about available care coordination services as a benefit of their health plan.   Follow Up Plan:  Additional outreach attempts will be made to offer the patient care coordination information and services.   Encounter Outcome:  No Answer  Care Coordination Interventions Activated:  No   Care Coordination Interventions:  No, not indicated    Rowe Pavy, RN, BSN, CEN [facility] [employer] [phone]

## 2022-06-26 ENCOUNTER — Ambulatory Visit (INDEPENDENT_AMBULATORY_CARE_PROVIDER_SITE_OTHER): Payer: BC Managed Care – PPO

## 2022-06-26 DIAGNOSIS — I255 Ischemic cardiomyopathy: Secondary | ICD-10-CM | POA: Diagnosis not present

## 2022-06-28 ENCOUNTER — Other Ambulatory Visit: Payer: Self-pay

## 2022-06-28 LAB — CUP PACEART REMOTE DEVICE CHECK
Battery Remaining Longevity: 72 mo
Battery Remaining Percentage: 71 %
Battery Voltage: 2.98 V
Brady Statistic RV Percent Paced: 0 %
Date Time Interrogation Session: 20230918164115
HighPow Impedance: 82 Ohm
HighPow Impedance: 82 Ohm
Implantable Lead Implant Date: 20200604
Implantable Lead Location: 753860
Implantable Pulse Generator Implant Date: 20200604
Lead Channel Impedance Value: 480 Ohm
Lead Channel Pacing Threshold Amplitude: 1 V
Lead Channel Pacing Threshold Pulse Width: 0.5 ms
Lead Channel Sensing Intrinsic Amplitude: 11.3 mV
Lead Channel Setting Pacing Amplitude: 2.5 V
Lead Channel Setting Pacing Pulse Width: 0.5 ms
Lead Channel Setting Sensing Sensitivity: 0.5 mV
Pulse Gen Serial Number: 9860889

## 2022-06-28 MED ORDER — INSULIN GLARGINE-YFGN 100 UNIT/ML ~~LOC~~ SOPN: 34.0000 [IU] | PEN_INJECTOR | Freq: Every day | SUBCUTANEOUS | 0 refills | Status: DC

## 2022-07-03 ENCOUNTER — Telehealth: Payer: Self-pay

## 2022-07-03 NOTE — Patient Outreach (Signed)
  Care Coordination   Initial Visit Note   07/03/2022 Name: Melinda Nguyen MRN: [id number] DOB: 1961-01-03  Melinda Nguyen is a 61 y.o. year old female who sees Cox, Kirsten, MD for primary care. I spoke with  [name] by phone today.  What matters to the patients health and wellness today?  Placed call to patient to offer and explain [facility] care coordination program.  Patient reports she is doing well except for her allergies. Reports taking zyrtec.  Encouraged patient to call MD office for worsening S/S. Denies fever. Patient declines services at this time.    SDOH assessments and interventions completed:  No     Care Coordination Interventions Activated:  No  Care Coordination Interventions:  No, not indicated   Follow up plan: No further intervention required.   Encounter Outcome:  Pt. Refused   Tomasa Rand, RN, BSN, CEN [facility] [employer] [phone]

## 2022-07-07 NOTE — Progress Notes (Signed)
Remote ICD transmission.   

## 2022-07-12 DIAGNOSIS — E113491 Type 2 diabetes mellitus with severe nonproliferative diabetic retinopathy without macular edema, right eye: Secondary | ICD-10-CM | POA: Diagnosis not present

## 2022-07-12 DIAGNOSIS — H43822 Vitreomacular adhesion, left eye: Secondary | ICD-10-CM | POA: Diagnosis not present

## 2022-07-12 DIAGNOSIS — E113511 Type 2 diabetes mellitus with proliferative diabetic retinopathy with macular edema, right eye: Secondary | ICD-10-CM | POA: Diagnosis not present

## 2022-07-12 DIAGNOSIS — H35372 Puckering of macula, left eye: Secondary | ICD-10-CM | POA: Diagnosis not present

## 2022-07-12 LAB — HM DIABETES EYE EXAM

## 2022-07-31 ENCOUNTER — Other Ambulatory Visit: Payer: Self-pay

## 2022-07-31 DIAGNOSIS — E1121 Type 2 diabetes mellitus with diabetic nephropathy: Secondary | ICD-10-CM

## 2022-07-31 DIAGNOSIS — I110 Hypertensive heart disease with heart failure: Secondary | ICD-10-CM

## 2022-07-31 MED ORDER — GLUCOSE BLOOD VI STRP: 1.0000 | ORAL_STRIP | 6 refills | Status: DC | PRN

## 2022-07-31 MED ORDER — CARVEDILOL 3.125 MG PO TABS: 3.1250 mg | ORAL_TABLET | Freq: Two times a day (BID) | ORAL | 0 refills | Status: DC

## 2022-08-02 DIAGNOSIS — E1140 Type 2 diabetes mellitus with diabetic neuropathy, unspecified: Secondary | ICD-10-CM | POA: Diagnosis not present

## 2022-08-16 ENCOUNTER — Ambulatory Visit: Payer: BC Managed Care – PPO | Admitting: Family Medicine

## 2022-08-16 ENCOUNTER — Encounter: Payer: Self-pay | Admitting: Family Medicine

## 2022-08-16 VITALS — BP 120/70 | HR 84 | Temp 97.1°F | Resp 18 | Ht 63.0 in | Wt 198.0 lb

## 2022-08-16 DIAGNOSIS — E782 Mixed hyperlipidemia: Secondary | ICD-10-CM

## 2022-08-16 DIAGNOSIS — Z6835 Body mass index (BMI) 35.0-35.9, adult: Secondary | ICD-10-CM

## 2022-08-16 DIAGNOSIS — G720 Drug-induced myopathy: Secondary | ICD-10-CM

## 2022-08-16 DIAGNOSIS — E1121 Type 2 diabetes mellitus with diabetic nephropathy: Secondary | ICD-10-CM

## 2022-08-16 DIAGNOSIS — I255 Ischemic cardiomyopathy: Secondary | ICD-10-CM | POA: Diagnosis not present

## 2022-08-16 DIAGNOSIS — F330 Major depressive disorder, recurrent, mild: Secondary | ICD-10-CM

## 2022-08-16 DIAGNOSIS — I2510 Atherosclerotic heart disease of native coronary artery without angina pectoris: Secondary | ICD-10-CM

## 2022-08-16 DIAGNOSIS — T466X5A Adverse effect of antihyperlipidemic and antiarteriosclerotic drugs, initial encounter: Secondary | ICD-10-CM

## 2022-08-16 MED ORDER — DULOXETINE HCL 40 MG PO CPEP: 40.0000 mg | ORAL_CAPSULE | Freq: Every day | ORAL | 0 refills | Status: DC

## 2022-08-16 NOTE — Assessment & Plan Note (Signed)
Intolerant to statins. 

## 2022-08-16 NOTE — Progress Notes (Signed)
Subjective:  Patient ID: Melinda Nguyen, female    DOB: 30-Dec-1960  Age: 61 y.o. MRN: [id number]  Chief Complaint  Patient presents with   Diabetes   HPI Diabetes:  Complications: Glomerulopathy Glucose checking: Patient checks two times a day Glucose logs: 80-90s, 140-200s in pm. Hypoglycemia: 80s feels shaky.  Most recent A1C: 7.9 Current medications: Metformin 1000 mg 1 tablet twice daily, Semglee 26 units before bed. If sugar is below 150 at night, holds evening metformin.  Foot checks: daily Eye visit: Stephannie Li, MD - October 2023.  Not eating healthy.   Hyperlipidemia: Current medications: Patient is currently not taking any medications for this. Patient is intolerant to statins and zetia.  Hypertensive Heart Disease with Chronic Systolic Congestive Heart Failure: Current medications: Patient is currently taking Carvedilol 3.125 mg 1 tablet twice daily, Losartan 50 mg take 1 tablet daily, baby aspirin 81 mg daily,   Depression, mild recurrent: Patient is currently taking Cymbalta 30 mg 1 tablet daily. She would like to increase cymbalta. Does not want to go all the way to 60 mg daily.  Taking Calm Day.      08/16/2022    8:48 AM 04/26/2022    8:44 AM 01/05/2022    7:55 AM  PHQ9 SCORE ONLY  PHQ-9 Total Score 5 2 0       04/26/2022    8:44 AM 01/05/2022    7:55 AM 06/23/2021    7:46 AM  PHQ9 SCORE ONLY  PHQ-9 Total Score 2 0 0    GERD: Omeprazole 20 mg daily as needed.     Current Outpatient Medications on File Prior to Visit  Medication Sig Dispense Refill   aspirin EC 81 MG tablet Take 1 tablet (81 mg total) by mouth daily. 90 tablet 3   carvedilol (COREG) 3.125 MG tablet Take 1 tablet (3.125 mg total) by mouth 2 (two) times daily with a meal. 180 tablet 0   glucose blood test strip 1 each by Other route as needed. Test blood sugar 3 times daily 100 each 6   insulin glargine-yfgn (SEMGLEE, YFGN,) 100 UNIT/ML Pen Inject 34 Units into the skin daily. (Patient  taking differently: Inject 24 Units into the skin daily.) 36 mL 0   Insulin Pen Needle (B-D UF III MINI PEN NEEDLES) 31G X 5 MM MISC 2 g by Does not apply route 4 (four) times daily as needed. (Patient taking differently: 2 g by Does not apply route 4 (four) times daily as needed (For injectable meds).) 100 each 3   LORazepam (ATIVAN) 0.5 MG tablet Take 1 tablet (0.5 mg total) by mouth daily as needed for anxiety. 30 tablet 0   losartan (COZAAR) 50 MG tablet Take 1 tablet (50 mg total) by mouth daily. 90 tablet 3   metFORMIN (GLUCOPHAGE) 1000 MG tablet TAKE 1 TABLET(1000 MG) BY MOUTH TWICE DAILY WITH A MEAL 180 tablet 1   nitroGLYCERIN (NITROSTAT) 0.4 MG SL tablet Place 1 tablet (0.4 mg total) under the tongue every 5 (five) minutes as needed. 25 tablet 12   omeprazole (PRILOSEC) 20 MG capsule Take 20 mg by mouth daily as needed (acid reflux).     No current facility-administered medications on file prior to visit.   Past Medical History:  Diagnosis Date   AICD (automatic cardioverter/defibrillator) present 03/13/2019   CAD (coronary artery disease)    CHF (congestive heart failure) (HCC)    Depression    Diabetes mellitus without complication (HCC)    GERD (gastroesophageal  reflux disease)    Hypertension    Mixed hyperlipidemia    Myocardial infarction [facility]) 2020   Other hypotension    Smoking history 03/10/2019   Smokes 1/2 pack a day   Ventricular tachycardia [facility])    Past Surgical History:  Procedure Laterality Date   ABDOMINAL HYSTERECTOMY     CATARACT EXTRACTION Bilateral    2016-2017   EYE SURGERY Right 10/2021   "gel leaking"/laser surgery   ICD IMPLANT N/A 03/13/2019   Procedure: ICD IMPLANT;  Surgeon: Regan Lemming, MD;  Location: [facility] INVASIVE CV LAB;  Service: Cardiovascular;  Laterality: N/A;   ICD IMPLANT  03/13/2019   LEFT HEART CATH AND CORONARY ANGIOGRAPHY N/A 03/12/2019   Procedure: LEFT HEART CATH AND CORONARY ANGIOGRAPHY;  Surgeon: Iran Ouch, MD;   Location: [facility] INVASIVE CV LAB;  Service: Cardiovascular;  Laterality: N/A;    Family History  Problem Relation Age of Onset   Hypertension Mother    Diabetes Mother    COPD Father    Social History   Socioeconomic History   Marital status: Widowed    Spouse name: Not on file   Number of children: Not on file   Years of education: Not on file   Highest education level: Not on file  Occupational History   Not on file  Tobacco Use   Smoking status: Former    Packs/day: 0.50    Types: Cigarettes    Quit date: 03/2019    Years since quitting: 3.4   Smokeless tobacco: Never  Vaping Use   Vaping Use: Never used  Substance and Sexual Activity   Alcohol use: Not Currently   Drug use: Never   Sexual activity: Not on file  Other Topics Concern   Not on file  Social History Narrative   Not on file   Social Determinants of Health   Financial Resource Strain: Not on file  Food Insecurity: Not on file  Transportation Needs: Not on file  Physical Activity: Not on file  Stress: Not on file  Social Connections: Not on file    Review of Systems  Constitutional:  Negative for chills, fatigue and fever.  HENT:  Negative for congestion, rhinorrhea and sore throat.   Respiratory:  Positive for cough. Negative for shortness of breath.   Cardiovascular:  Negative for chest pain.  Gastrointestinal:  Positive for constipation. Negative for abdominal pain, diarrhea, nausea and vomiting.  Genitourinary:  Negative for dysuria and urgency.  Musculoskeletal:  Negative for back pain and myalgias.  Neurological:  Negative for dizziness, weakness, light-headedness and headaches.  Psychiatric/Behavioral:  Negative for dysphoric mood. The patient is not nervous/anxious.      Objective:  BP 120/70   Pulse 84   Temp (!) 97.1 F (36.2 C)   Resp 18   Ht 5\' 3"  (1.6 m)   Wt 198 lb (89.8 kg)   BMI 35.07 kg/m      08/16/2022    8:10 AM 08/16/2022    8:05 AM 04/26/2022    8:55 AM  BP/Weight   Systolic BP 120 168 122  Diastolic BP 70 80 62  Wt. (Lbs)  198   BMI  35.07 kg/m2     Physical Exam Vitals reviewed.  Constitutional:      Appearance: Normal appearance.  Neck:     Vascular: No carotid bruit.  Cardiovascular:     Rate and Rhythm: Normal rate and regular rhythm.     Heart sounds: Normal heart sounds.  Pulmonary:     Effort: Pulmonary effort is normal. No respiratory distress.     Breath sounds: Normal breath sounds.  Abdominal:     General: Abdomen is flat. Bowel sounds are normal.     Palpations: Abdomen is soft.     Tenderness: There is no abdominal tenderness.  Neurological:     Mental Status: She is alert and oriented to person, place, and time.  Psychiatric:        Mood and Affect: Mood normal.        Behavior: Behavior normal.     Diabetic Foot Exam - Simple   Simple Foot Form Diabetic Foot exam was performed with the following findings: Yes 08/16/2022  3:33 PM  Visual Inspection No deformities, no ulcerations, no other skin breakdown bilaterally: Yes Sensation Testing Intact to touch and monofilament testing bilaterally: Yes Pulse Check Posterior Tibialis and Dorsalis pulse intact bilaterally: Yes Comments      Lab Results  Component Value Date   WBC 9.2 04/26/2022   HGB 14.1 04/26/2022   HCT 43.1 04/26/2022   PLT 278 04/26/2022   GLUCOSE 117 (H) 04/26/2022   CHOL 187 04/26/2022   TRIG 136 04/26/2022   HDL 57 04/26/2022   LDLCALC 106 (H) 04/26/2022   ALT 11 04/26/2022   AST 14 04/26/2022   NA 138 04/26/2022   K 4.9 04/26/2022   CL 100 04/26/2022   CREATININE 0.64 04/26/2022   BUN 12 04/26/2022   CO2 25 04/26/2022   TSH 2.440 04/26/2022   HGBA1C 7.7 (H) 04/26/2022   MICROALBUR 80 06/23/2021      Assessment & Plan:   Problem List Items Addressed This Visit       Cardiovascular and Mediastinum   Coronary artery disease completely occluded circumflex artery based on cardiac cath in June 2020   Ischemic cardiomyopathy      Endocrine   Diabetic glomerulopathy (HCC)    Control:  Recommend check sugars fasting daily. Recommend check feet daily. Recommend annual eye exams. Medicines: Metformin 1000 mg 1 tablet twice daily, Semglee 26 units before bed. If sugar is below 150 at night, changes semglee. Continue to work on eating a healthy diet and exercise.  Labs requested           Musculoskeletal and Integument   Statin myopathy    Intolerant to statins        Other   Depression, major, recurrent, mild (HCC) - Primary (Chronic)    Change duloxetine to 40 mg before bed.       Relevant Medications   DULoxetine HCl 40 MG CPEP   Severe obesity with body mass index (BMI) of 35.0 to 35.9 and comorbidity (HCC)    Comorbidities include coronary artery disease and diabetes. Work on eating healthier.  Exercise recommended.      Mixed hyperlipidemia    Recommend continue to work on eating healthy diet and exercise. Intolerant to stains and zetia.  Request labs     .  Meds ordered this encounter  Medications   DULoxetine HCl 40 MG CPEP    Sig: Take 40 mg by mouth daily.    Dispense:  90 capsule    Refill:  0    No orders of the defined types were placed in this encounter.    Follow-up: Return in about 3 months (around 11/16/2022) for chronic fasting.  An After Visit Summary was printed and given to the patient.  Blane Ohara, MD [facility] [phone]

## 2022-08-16 NOTE — Assessment & Plan Note (Signed)
Comorbidities include coronary artery disease and diabetes. Work on eating healthier.  Exercise recommended.

## 2022-08-16 NOTE — Assessment & Plan Note (Signed)
Recommend continue to work on eating healthy diet and exercise. Intolerant to stains and zetia.  Request labs

## 2022-08-16 NOTE — Assessment & Plan Note (Signed)
Control:  Recommend check sugars fasting daily. Recommend check feet daily. Recommend annual eye exams. Medicines: Metformin 1000 mg 1 tablet twice daily, Semglee 26 units before bed. If sugar is below 150 at night, changes semglee. Continue to work on eating a healthy diet and exercise.  Labs requested

## 2022-08-16 NOTE — Patient Instructions (Addendum)
Increase carvedilol to 3.125 mg 2 pills twice daily.  Continue losartan 50 mg once daily. Check bp and pulse daily. Call in 3 weeks after thanksgiving.   Change insulin to am dosing.

## 2022-08-16 NOTE — Assessment & Plan Note (Signed)
Change duloxetine to 40 mg before bed.

## 2022-08-18 ENCOUNTER — Other Ambulatory Visit: Payer: Self-pay

## 2022-08-18 MED ORDER — DULOXETINE HCL 20 MG PO CPEP: 40.0000 mg | ORAL_CAPSULE | Freq: Every day | ORAL | 0 refills | Status: DC

## 2022-09-06 ENCOUNTER — Encounter: Payer: Self-pay | Admitting: Cardiology

## 2022-09-06 ENCOUNTER — Ambulatory Visit: Payer: BC Managed Care – PPO | Attending: Cardiology | Admitting: Cardiology

## 2022-09-06 VITALS — BP 138/72 | HR 82 | Ht 64.0 in | Wt 197.4 lb

## 2022-09-06 DIAGNOSIS — I2510 Atherosclerotic heart disease of native coronary artery without angina pectoris: Secondary | ICD-10-CM | POA: Diagnosis not present

## 2022-09-06 DIAGNOSIS — I472 Ventricular tachycardia, unspecified: Secondary | ICD-10-CM

## 2022-09-06 DIAGNOSIS — G720 Drug-induced myopathy: Secondary | ICD-10-CM | POA: Diagnosis not present

## 2022-09-06 DIAGNOSIS — I255 Ischemic cardiomyopathy: Secondary | ICD-10-CM | POA: Diagnosis not present

## 2022-09-06 DIAGNOSIS — E782 Mixed hyperlipidemia: Secondary | ICD-10-CM

## 2022-09-06 DIAGNOSIS — Z95810 Presence of automatic (implantable) cardiac defibrillator: Secondary | ICD-10-CM

## 2022-09-06 DIAGNOSIS — T466X5A Adverse effect of antihyperlipidemic and antiarteriosclerotic drugs, initial encounter: Secondary | ICD-10-CM

## 2022-09-06 MED ORDER — NEXLETOL 180 MG PO TABS: 108.0000 mg | ORAL_TABLET | Freq: Every day | ORAL | 3 refills | Status: DC

## 2022-09-06 NOTE — Progress Notes (Signed)
Cardiology Office Note:    Date:  09/06/2022   ID:  [name], DOB January 17, 1961, MRN [id number]  PCP:  Blane Ohara, MD  Cardiologist:  Gypsy Balsam, MD    Referring MD: Blane Ohara, MD   Chief Complaint  Patient presents with   Hypertension    Ongoing since 05/09/2022    History of Present Illness:    Melinda Nguyen is a 61 y.o. female   with past medical history significant for coronary artery disease many years ago she had cardiac catheterization done which showed completely occluded circumflex artery, at that time she was also find to have cardiomyopathy with ejection fraction 40 to 45% she have history of ventricular tachycardia she does have ICD which is Abbott device, also dyslipidemia and diabetes.  Since the time I see her last time she is doing very well she did have echocardiogram done which showed normalization of left ventricle ejection fraction.  Denies have any chest pain tightness squeezing pressure burning chest no palpitations dizziness.  She is concerned about her blood pressure she brought in blood pressure measurements blood pressure is elevated but she always check blood pressure in the morning when she wakes up today in the office slightly on the higher side but still acceptable.  Past Medical History:  Diagnosis Date   AICD (automatic cardioverter/defibrillator) present 03/13/2019   CAD (coronary artery disease)    CHF (congestive heart failure) (HCC)    Depression    Diabetes mellitus without complication (HCC)    GERD (gastroesophageal reflux disease)    Hypertension    Mixed hyperlipidemia    Myocardial infarction (HCC) 2020   Other hypotension    Smoking history 03/10/2019   Smokes 1/2 pack a day   Ventricular tachycardia [facility])     Past Surgical History:  Procedure Laterality Date   ABDOMINAL HYSTERECTOMY     CATARACT EXTRACTION Bilateral    2016-2017   EYE SURGERY Right 10/2021   "gel leaking"/laser surgery   ICD IMPLANT N/A 03/13/2019    Procedure: ICD IMPLANT;  Surgeon: Regan Lemming, MD;  Location: [facility] INV[facility];  Service: Cardiovascular;  Laterality: N/A;   ICD IMPLANT  03/13/2019   LEFT HEART CATH AND CORONARY ANGIOGRAPHY N/A 03/12/2019   Procedure: LEFT HEART CATH AND CORONARY ANGIOGRAPHY;  Surgeon: Iran Ouch, MD;  Location: [facility] INVASIVE CV LAB;  Service: Cardiovascular;  Laterality: N/A;    Current Medications: Current Meds  Medication Sig   aspirin EC 81 MG tablet Take 1 tablet (81 mg total) by mouth daily.   carvedilol (COREG) 3.125 MG tablet Take 1 tablet (3.125 mg total) by mouth 2 (two) times daily with a meal.   DULoxetine (CYMBALTA) 20 MG capsule Take 2 capsules (40 mg total) by mouth daily. (Patient taking differently: Take 30 mg by mouth daily.)   glucose blood test strip 1 each by Other route as needed. Test blood sugar 3 times daily (Patient taking differently: 1 each by Other route as needed for other (Glucose check). Test blood sugar 3 times daily)   insulin glargine-yfgn (SEMGLEE, YFGN,) 100 UNIT/ML Pen Inject 34 Units into the skin daily. (Patient taking differently: Inject 24 Units into the skin daily.)   Insulin Pen Needle (B-D UF III MINI PEN NEEDLES) 31G X 5 MM MISC 2 g by Does not apply route 4 (four) times daily as needed. (Patient taking differently: 2 g by Does not apply route 4 (four) times daily as needed (For injectable meds).)   LORazepam (  ATIVAN) 0.5 MG tablet Take 1 tablet (0.5 mg total) by mouth daily as needed for anxiety.   losartan (COZAAR) 50 MG tablet Take 1 tablet (50 mg total) by mouth daily.   metFORMIN (GLUCOPHAGE) 1000 MG tablet TAKE 1 TABLET(1000 MG) BY MOUTH TWICE DAILY WITH A MEAL (Patient taking differently: Take 1,000 mg by mouth 2 (two) times daily with a meal.)   nitroGLYCERIN (NITROSTAT) 0.4 MG SL tablet Place 1 tablet (0.4 mg total) under the tongue every 5 (five) minutes as needed. (Patient taking differently: Place 0.4 mg under the tongue every 5 (five)  minutes as needed for chest pain.)   omeprazole (PRILOSEC) 20 MG capsule Take 20 mg by mouth daily as needed (acid reflux).   OVER THE COUNTER MEDICATION Take 1 tablet by mouth daily. Nutritional Frontier: Cybzyme   OVER THE COUNTER MEDICATION Take 1 tablet by mouth daily. Nutritional Frontier: Calm Day   OVER THE COUNTER MEDICATION Take 1 tablet by mouth daily. Nutritional Frontier: Thermogenesis Complete   OVER THE COUNTER MEDICATION Take 1 tablet by mouth daily. Nutritional Frontier: SPM     Allergies:   Shrimp [shellfish allergy], Hydrocodone, Codeine, Crestor [rosuvastatin], Farxiga [dapagliflozin], Flagyl [metronidazole], Glimepiride, Jardiance [empagliflozin], Lipitor [atorvastatin], Ozempic (0.25 or 0.5 mg-dose) [semaglutide(0.25 or 0.5mg -dos)], Victoza [liraglutide], and Zetia [ezetimibe]   Social History   Socioeconomic History   Marital status: Widowed    Spouse name: Not on file   Number of children: Not on file   Years of education: Not on file   Highest education level: Not on file  Occupational History   Not on file  Tobacco Use   Smoking status: Former    Packs/day: 0.50    Types: Cigarettes    Quit date: 03/2019    Years since quitting: 3.4   Smokeless tobacco: Never  Vaping Use   Vaping Use: Never used  Substance and Sexual Activity   Alcohol use: Not Currently   Drug use: Never   Sexual activity: Not on file  Other Topics Concern   Not on file  Social History Narrative   Not on file   Social Determinants of Health   Financial Resource Strain: Not on file  Food Insecurity: Not on file  Transportation Needs: Not on file  Physical Activity: Not on file  Stress: Not on file  Social Connections: Not on file     Family History: The patient's family history includes COPD in her father; Diabetes in her mother; Hypertension in her mother. ROS:   Please see the history of present illness.    All 14 point review of systems negative except as described per  history of present illness  EKGs/Labs/Other Studies Reviewed:      Recent Labs: 04/26/2022: ALT 11; BUN 12; Creatinine, Ser 0.64; Hemoglobin 14.1; Platelets 278; Potassium 4.9; Sodium 138; TSH 2.440  Recent Lipid Panel    Component Value Date/Time   CHOL 187 04/26/2022 0857   TRIG 136 04/26/2022 0857   HDL 57 04/26/2022 0857   CHOLHDL 3.3 04/26/2022 0857   CHOLHDL 3.7 03/11/2019 0101   VLDL 22 03/11/2019 0101   LDLCALC 106 (H) 04/26/2022 0857    Physical Exam:    VS:  BP 138/72 (BP Location: Left Arm, Patient Position: Sitting)   Pulse 82   Ht 5\' 4"  (1.626 m)   Wt 197 lb 6.4 oz (89.5 kg)   SpO2 95%   BMI 33.88 kg/m     Wt Readings from Last 3 Encounters:  09/06/22 197  lb 6.4 oz (89.5 kg)  08/16/22 198 lb (89.8 kg)  04/26/22 193 lb (87.5 kg)     GEN:  Well nourished, well developed in no acute distress HEENT: Normal NECK: No JVD; No carotid bruits LYMPHATICS: No lymphadenopathy CARDIAC: RRR, no murmurs, no rubs, no gallops RESPIRATORY:  Clear to auscultation without rales, wheezing or rhonchi  ABDOMEN: Soft, non-tender, non-distended MUSCULOSKELETAL:  No edema; No deformity  SKIN: Warm and dry LOWER EXTREMITIES: no swelling NEUROLOGIC:  Alert and oriented x 3 PSYCHIATRIC:  Normal affect   ASSESSMENT:    1. Ischemic cardiomyopathy   2. Coronary artery disease involving native coronary artery of native heart without angina pectoris   3. Ventricular tachycardia (HCC)   4. Statin myopathy   5. Mixed hyperlipidemia   6. ICD (implantable cardioverter-defibrillator) in place    PLAN:    In order of problems listed above:  Ischemic cardiomyopathy normalization of left ventricle ejection fraction based on last echocardiogram.  Continue present management which include antiplatelets therapy. History of ventricle tachycardia she does have ICD no discharges no detection. Dyslipidemia with intolerance to multiple medications spent great of time talking to her about  that.  Recently she was at the beach and she had some lab work test done.  Her LDL was significantly elevated 123.  Again had a long discussion about need to treat this cholesterol.  She does have multiple indications for it.  She agreed to try Nexletol if that will be insertable then we will consider PCSK9 agents. ICD present: I did review interrogation normal function   Medication Adjustments/Labs and Tests Ordered: Current medicines are reviewed at length with the patient today.  Concerns regarding medicines are outlined above.  No orders of the defined types were placed in this encounter.  Medication changes: No orders of the defined types were placed in this encounter.   Signed, Georgeanna Lea, MD, [facility] 09/06/2022 8:39 AM    [facility]

## 2022-09-06 NOTE — Addendum Note (Signed)
Addended by: Baldo Ash D on: 09/06/2022 09:03 AM   Modules accepted: Orders

## 2022-09-06 NOTE — Patient Instructions (Addendum)
Medication Instructions:  Your physician has recommended you make the following change in your medication:   START: Nexletol 1 tablet daily    Lab Work: Your physician recommends that you return for lab work in: 6 weeks after starting Nexletol You need to have labs done when you are fasting.  You can come Monday through Friday 8:30 am to 12:00 pm and 1:15 to 4:30. You do not need to make an appointment as the order has already been placed. The labs you are going to have done are AST, ALT  Lipids. .   Testing/Procedures:     Follow-Up: At [facility], you and your health needs are our priority.  As part of our continuing mission to provide you with exceptional heart care, we have created designated Provider Care Teams.  These Care Teams include your primary Cardiologist (physician) and Advanced Practice Providers (APPs -  Physician Assistants and Nurse Practitioners) who all work together to provide you with the care you need, when you need it.  We recommend signing up for the patient portal called "MyChart".  Sign up information is provided on this After Visit Summary.  MyChart is used to connect with patients for Virtual Visits (Telemedicine).  Patients are able to view lab/test results, encounter notes, upcoming appointments, etc.  Non-urgent messages can be sent to your provider as well.   To learn more about what you can do with MyChart, go to [email or web address].    Your next appointment:   6 month(s)  The format for your next appointment:   In Person  Provider:   Gypsy Balsam, MD    Other Instructions NA

## 2022-09-07 ENCOUNTER — Telehealth: Payer: Self-pay

## 2022-09-07 NOTE — Telephone Encounter (Signed)
Patient notified of prior auth for Nexletol has been approved. Confirmation #[id number]

## 2022-09-25 ENCOUNTER — Telehealth: Payer: Self-pay

## 2022-09-25 ENCOUNTER — Other Ambulatory Visit: Payer: Self-pay | Admitting: Cardiology

## 2022-09-25 ENCOUNTER — Other Ambulatory Visit: Payer: Self-pay | Admitting: Family Medicine

## 2022-09-25 NOTE — Telephone Encounter (Signed)
Patient informed Nexletol samples available for pick up. Patient is out of town and will be back on 10/04/2022

## 2022-10-11 ENCOUNTER — Other Ambulatory Visit: Payer: Self-pay

## 2022-10-11 MED ORDER — DULOXETINE HCL 40 MG PO CPEP: 40.0000 mg | ORAL_CAPSULE | Freq: Every day | ORAL | 0 refills | Status: DC

## 2022-10-12 ENCOUNTER — Other Ambulatory Visit: Payer: Self-pay

## 2022-10-12 MED ORDER — DULOXETINE HCL 40 MG PO CPEP: 40.0000 mg | ORAL_CAPSULE | Freq: Every day | ORAL | 1 refills | Status: DC

## 2022-10-16 ENCOUNTER — Other Ambulatory Visit: Payer: Self-pay

## 2022-10-16 ENCOUNTER — Ambulatory Visit (INDEPENDENT_AMBULATORY_CARE_PROVIDER_SITE_OTHER): Payer: BC Managed Care – PPO

## 2022-10-16 DIAGNOSIS — I255 Ischemic cardiomyopathy: Secondary | ICD-10-CM

## 2022-10-16 MED ORDER — DULOXETINE HCL 40 MG PO CPEP: 40.0000 mg | ORAL_CAPSULE | Freq: Every day | ORAL | 1 refills | Status: DC

## 2022-10-17 DIAGNOSIS — I255 Ischemic cardiomyopathy: Secondary | ICD-10-CM | POA: Diagnosis not present

## 2022-10-17 LAB — CUP PACEART REMOTE DEVICE CHECK
Battery Remaining Longevity: 70 mo
Battery Remaining Percentage: 69 %
Battery Voltage: 2.98 V
Brady Statistic RV Percent Paced: 1 %
Date Time Interrogation Session: 20240106021907
HighPow Impedance: 82 Ohm
HighPow Impedance: 82 Ohm
Implantable Lead Connection Status: 753985
Implantable Lead Implant Date: 20200604
Implantable Lead Location: 753860
Implantable Pulse Generator Implant Date: 20200604
Lead Channel Impedance Value: 490 Ohm
Lead Channel Pacing Threshold Amplitude: 1 V
Lead Channel Pacing Threshold Pulse Width: 0.5 ms
Lead Channel Sensing Intrinsic Amplitude: 11.3 mV
Lead Channel Setting Pacing Amplitude: 2.5 V
Lead Channel Setting Pacing Pulse Width: 0.5 ms
Lead Channel Setting Sensing Sensitivity: 0.5 mV
Pulse Gen Serial Number: 9860889
Zone Setting Status: 755011

## 2022-10-28 ENCOUNTER — Other Ambulatory Visit: Payer: Self-pay | Admitting: Family Medicine

## 2022-10-28 DIAGNOSIS — I110 Hypertensive heart disease with heart failure: Secondary | ICD-10-CM

## 2022-10-30 ENCOUNTER — Ambulatory Visit: Payer: BC Managed Care – PPO | Attending: Cardiology | Admitting: Cardiology

## 2022-10-30 ENCOUNTER — Other Ambulatory Visit: Payer: Self-pay | Admitting: Family Medicine

## 2022-10-30 ENCOUNTER — Encounter: Payer: Self-pay | Admitting: Cardiology

## 2022-10-30 VITALS — BP 146/78 | HR 92 | Ht 64.0 in | Wt 200.0 lb

## 2022-10-30 DIAGNOSIS — I472 Ventricular tachycardia, unspecified: Secondary | ICD-10-CM

## 2022-10-30 DIAGNOSIS — I25119 Atherosclerotic heart disease of native coronary artery with unspecified angina pectoris: Secondary | ICD-10-CM | POA: Diagnosis not present

## 2022-10-30 DIAGNOSIS — I10 Essential (primary) hypertension: Secondary | ICD-10-CM

## 2022-10-30 LAB — CUP PACEART INCLINIC DEVICE CHECK
Battery Remaining Longevity: 68 mo
Brady Statistic RV Percent Paced: 0 %
Date Time Interrogation Session: 20240122084208
HighPow Impedance: 84.375
Implantable Lead Connection Status: 753985
Implantable Lead Implant Date: 20200604
Implantable Lead Location: 753860
Implantable Pulse Generator Implant Date: 20200604
Lead Channel Impedance Value: 512.5 Ohm
Lead Channel Pacing Threshold Amplitude: 0.5 V
Lead Channel Pacing Threshold Amplitude: 0.5 V
Lead Channel Pacing Threshold Pulse Width: 0.5 ms
Lead Channel Pacing Threshold Pulse Width: 0.5 ms
Lead Channel Sensing Intrinsic Amplitude: 11.3 mV
Lead Channel Setting Pacing Amplitude: 2.5 V
Lead Channel Setting Pacing Pulse Width: 0.5 ms
Lead Channel Setting Sensing Sensitivity: 0.5 mV
Pulse Gen Serial Number: 9860889
Zone Setting Status: 755011

## 2022-10-30 MED ORDER — INSULIN GLARGINE-YFGN 100 UNIT/ML ~~LOC~~ SOPN: 34.0000 [IU] | PEN_INJECTOR | Freq: Every day | SUBCUTANEOUS | 0 refills | Status: DC

## 2022-10-30 MED ORDER — CARVEDILOL 12.5 MG PO TABS: 12.5000 mg | ORAL_TABLET | Freq: Two times a day (BID) | ORAL | 11 refills | Status: DC

## 2022-10-30 NOTE — Progress Notes (Signed)
Electrophysiology Office Note   Date:  10/30/2022   ID:  [name], DOB 05-18-1961, MRN [id number]  PCP:  Rochel Brome, MD  Cardiologist:  Agustin Cree Primary Electrophysiologist:  Annelyse Rey Meredith Leeds, MD    Chief Complaint: VT   History of Present Illness: Melinda Nguyen is a 62 y.o. female who is being seen today for the evaluation of VT at the request of Cox, Elnita Maxwell, MD. Presenting today for electrophysiology evaluation.  She has a history significant for poorly diabetes, obesity, tobacco abuse, CHF, hyperlipidemia, coronary artery disease.  She presented [facility] July 2020 with nausea and vomiting.  She did not have a primary physician's office and was noted to be in ventricular tachycardia.  She is status post Arpelar ICD implanted 03/13/2019.  Today, denies symptoms of palpitations, chest pain, shortness of breath, orthopnea, PND, lower extremity edema, claudication, dizziness, presyncope, syncope, bleeding, or neurologic sequela. The patient is tolerating medications without difficulties.  She currently feels well.  She has noted increased blood pressures at home into the 140s to 160s.  She has had no further palpitations.  She otherwise feels well and is without complaint.    Past Medical History:  Diagnosis Date   AICD (automatic cardioverter/defibrillator) present 03/13/2019   CAD (coronary artery disease)    CHF (congestive heart failure) ([place])    Depression    Diabetes mellitus without complication (HCC)    GERD (gastroesophageal reflux disease)    Hypertension    Mixed hyperlipidemia    Myocardial infarction ([place]) 2020   Other hypotension    Smoking history 03/10/2019   Smokes 1/2 pack a day   Ventricular tachycardia [facility])    Past Surgical History:  Procedure Laterality Date   ABDOMINAL HYSTERECTOMY     CATARACT EXTRACTION Bilateral    2016-2017   EYE SURGERY Right 10/2021   "gel leaking"/laser surgery   ICD IMPLANT N/A 03/13/2019   Procedure:  ICD IMPLANT;  Surgeon: Constance Haw, MD;  Location: [place] CV LAB;  Service: Cardiovascular;  Laterality: N/A;   ICD IMPLANT  03/13/2019   LEFT HEART CATH AND CORONARY ANGIOGRAPHY N/A 03/12/2019   Procedure: LEFT HEART CATH AND CORONARY ANGIOGRAPHY;  Surgeon: Wellington Hampshire, MD;  Location: [place] CV LAB;  Service: Cardiovascular;  Laterality: N/A;     Current Outpatient Medications  Medication Sig Dispense Refill   aspirin EC 81 MG tablet Take 1 tablet (81 mg total) by mouth daily. 90 tablet 3   Bempedoic Acid (NEXLETOL) 180 MG TABS Take 108 mg by mouth daily. 90 tablet 3   carvedilol (COREG) 12.5 MG tablet Take 1 tablet (12.5 mg total) by mouth 2 (two) times daily. 60 tablet 11   DULoxetine (CYMBALTA) 30 MG capsule Take 30 mg by mouth daily.     glucose blood test strip 1 each by Other route as needed. Test blood sugar 3 times daily (Patient taking differently: 1 each by Other route as needed for other (Glucose check). Test blood sugar 3 times daily) 100 each 6   insulin glargine-yfgn (SEMGLEE, YFGN,) 100 UNIT/ML Pen Inject 34 Units into the skin daily. (Patient taking differently: Inject 24 Units into the skin daily.) 36 mL 0   Insulin Pen Needle (B-D UF III MINI PEN NEEDLES) 31G X 5 MM MISC 2 g by Does not apply route 4 (four) times daily as needed. (Patient taking differently: 2 g by Does not apply route 4 (four) times daily as needed (For injectable meds).) 100  each 3   LORazepam (ATIVAN) 0.5 MG tablet Take 1 tablet (0.5 mg total) by mouth daily as needed for anxiety. 30 tablet 0   losartan (COZAAR) 50 MG tablet TAKE 1 TABLET(50 MG) BY MOUTH DAILY 90 tablet 3   MAGNESIUM PO Take 1 tablet by mouth daily.     metFORMIN (GLUCOPHAGE) 1000 MG tablet TAKE 1 TABLET(1000 MG) BY MOUTH TWICE DAILY WITH A MEAL 180 tablet 0   nitroGLYCERIN (NITROSTAT) 0.4 MG SL tablet Place 1 tablet (0.4 mg total) under the tongue every 5 (five) minutes as needed. (Patient taking differently: Place  0.4 mg under the tongue every 5 (five) minutes as needed for chest pain.) 25 tablet 12   omeprazole (PRILOSEC) 20 MG capsule Take 20 mg by mouth daily as needed (acid reflux).     OVER THE COUNTER MEDICATION Take 1 tablet by mouth daily. Nutritional Frontier: Cybzyme     OVER THE COUNTER MEDICATION Take 1 tablet by mouth daily. Nutritional Frontier: Calm Day     OVER THE COUNTER MEDICATION Take 1 tablet by mouth daily. Nutritional Frontier: Thermogenesis Complete     OVER THE COUNTER MEDICATION Take 1 tablet by mouth daily. Nutritional Frontier: SPM     No current facility-administered medications for this visit.    Allergies:   Shrimp [shellfish allergy], Hydrocodone, Codeine, Crestor [rosuvastatin], Farxiga [dapagliflozin], Flagyl [metronidazole], Glimepiride, Jardiance [empagliflozin], Lipitor [atorvastatin], Ozempic (0.25 or 0.5 mg-dose) [semaglutide(0.25 or 0.5mg -dos)], Victoza [liraglutide], and Zetia [ezetimibe]   Social History:  The patient  reports that she quit smoking about 3 years ago. Her smoking use included cigarettes. She smoked an average of .5 packs per day. She has never used smokeless tobacco. She reports that she does not currently use alcohol. She reports that she does not use drugs.   Family History:  The patient's family history includes COPD in her father; Diabetes in her mother; Hypertension in her mother.   ROS:  Please see the history of present illness.   Otherwise, review of systems is positive for none.   All other systems are reviewed and negative.   PHYSICAL EXAM: VS:  BP (!) 146/78   Pulse 92   Ht 5\' 4"  (1.626 m)   Wt 200 lb (90.7 kg)   SpO2 94%   BMI 34.33 kg/m  , BMI Body mass index is 34.33 kg/m. GEN: Well nourished, well developed, in no acute distress  HEENT: normal  Neck: no JVD, carotid bruits, or masses Cardiac: RRR; no murmurs, rubs, or gallops,no edema  Respiratory:  clear to auscultation bilaterally, normal work of breathing GI: soft,  nontender, nondistended, + BS MS: no deformity or atrophy  Skin: warm and dry, device site well healed Neuro:  Strength and sensation are intact Psych: euthymic mood, full affect  EKG:  EKG is ordered today. Personal review of the ekg ordered shows sinus rhythm  Personal review of the device interrogation today. Results in [place]: 04/26/2022: ALT 11; BUN 12; Creatinine, Ser 0.64; Hemoglobin 14.1; Platelets 278; Potassium 4.9; Sodium 138; TSH 2.440    Lipid Panel     Component Value Date/Time   CHOL 187 04/26/2022 0857   TRIG 136 04/26/2022 0857   HDL 57 04/26/2022 0857   CHOLHDL 3.3 04/26/2022 0857   CHOLHDL 3.7 03/11/2019 0101   VLDL 22 03/11/2019 0101   LDLCALC 106 (H) 04/26/2022 0857     Wt Readings from Last 3 Encounters:  10/30/22 200 lb (90.7 kg)  09/06/22 197  lb 6.4 oz (89.5 kg)  08/16/22 198 lb (89.8 kg)      Other studies Reviewed: Additional studies/ records that were reviewed today include: TTE 09/27/2021 Review of the above records today demonstrates:   Left Ventricle: Left ventricular ejection fraction, by estimation, is 60  to 65%. The left ventricle has normal function. The left ventricle has no  regional wall motion abnormalities. Global longitudinal strain performed  but not reported based on  interpreter judgement due to suboptimal tracking. The left ventricular  internal cavity size was normal in size. There is mild concentric left  ventricular hypertrophy. Left ventricular diastolic parameters are  consistent with Grade I diastolic dysfunction   (impaired relaxation). Normal left ventricular filling pressure.   Left heart catheterization 03/12/2019 Prox Cx to Mid Cx lesion is 100% stenosed. Prox LAD lesion is 30% stenosed. Mid RCA lesion is 30% stenosed.   ASSESSMENT AND PLAN:  1.  Coronary artery disease with chronically Interval has a chronically occluded circumflex and lesion in the LAD and RCA.  Currently on carvedilol and  as needed nitroglycerin.  No current chest pain.  2.  Ventricular tachycardia: Status post Saint Jude ICD estimated on 2020.  Device function appropriately.  Continues to have nonsustained VT.  She has been hypertensive and mildly tachycardic.  Alice Vitelli increase carvedilol to 12.5 mg twice daily.  3.  Hyperlipidemia: Patient Natika Geyer refuse.  No current statin therapy.  Plan per primary cardiology.  4.  Hypertension: Blood pressure elevated today.  Increase carvedilol as above.   Current medicines are reviewed at length with the patient today.   The patient does not have concerns regarding her medicines.  The following changes were made today: Increase carvedilol  Labs/ tests ordered today include:  Orders Placed This Encounter  Procedures   CUP PACEART INCLINIC DEVICE CHECK   EKG 12-Lead     Disposition:   FU with Kahner Yanik 1 year  Signed, Treasure Ochs Jorja Loa, MD  10/30/2022 8:54 AM     [facility] [address] [place] [address] [phone] (office) [phone] (fax)

## 2022-10-30 NOTE — Patient Instructions (Signed)
Medication Instructions:  Your physician has recommended you make the following change in your medication:  INCREASE Carvedilol to 12.5 mg twice daily  *If you need a refill on your cardiac medications before your next appointment, please call your pharmacy*   Lab Work: None ordered   Testing/Procedures: None ordered   Follow-Up: At [facility], you and your health needs are our priority.  As part of our continuing mission to provide you with exceptional heart care, we have created designated Provider Care Teams.  These Care Teams include your primary Cardiologist (physician) and Advanced Practice Providers (APPs -  Physician Assistants and Nurse Practitioners) who all work together to provide you with the care you need, when you need it.  Remote monitoring is used to monitor your Pacemaker or ICD from home. This monitoring reduces the number of office visits required to check your device to one time per year. It allows [place] to keep an eye on the functioning of your device to ensure it is working properly. You are scheduled for a device check from home on 01/15/23. You may send your transmission at any time that day. If you have a wireless device, the transmission will be sent automatically. After your physician reviews your transmission, you will receive a postcard with your next transmission date.  Your next appointment:   1 year(s)  The format for your next appointment:   In Person  Provider:   Allegra Lai, MD    Thank you for choosing [facility]!!   Trinidad Curet, RN [phone]

## 2022-11-17 NOTE — Progress Notes (Signed)
Remote ICD transmission.   

## 2022-12-05 NOTE — Assessment & Plan Note (Signed)
Continue Carvedilol 3.125 mg 1 tablet twice daily, Nexletol 180 mg daily, aspirin 81 mg daily.

## 2022-12-05 NOTE — Assessment & Plan Note (Signed)
Well controlled.  No changes to medicines. Losartan 50 mg daily, Carvedilol 12.5 mg twice a day. Continue to work on eating a healthy diet and exercise.  Labs drawn today.

## 2022-12-05 NOTE — Assessment & Plan Note (Signed)
The current medical regimen is effective;  continue present plan and medications.  Continue omeprazole 20 mg daily as needed.

## 2022-12-05 NOTE — Assessment & Plan Note (Signed)
Control: good Recommend check sugars fasting daily. Recommend check feet daily. Recommend annual eye exams. Medicines: Metformin 1000 mg 1 tablet twice daily, Semglee 34 units before bed. If sugar is below 150 at night, changes semglee. Continue to work on eating a healthy diet and exercise.  Labs ordered.

## 2022-12-05 NOTE — Progress Notes (Unsigned)
Subjective:  Patient ID: Melinda Nguyen, female    DOB: 1960/12/10  Age: 62 y.o. MRN: [id number]  Chief Complaint  Patient presents with   Hypertension   Hyperlipidemia    HPI   Diabetes:  Complications: Glomerulopathy Glucose checking: Patient checks two times a day Glucose logs: 80-90s, 140-200s in pm. Hypoglycemia: 80s feels shaky.  Most recent A1C: 7.7 Current medications: Metformin 1000 mg 1 tablet twice daily, Semglee 34 units before bed. If sugar is below 150 at night, holds evening metformin.  Foot checks: daily Eye visit: [name], MD - October 2023.  Not eating healthy.   Hyperlipidemia: Current medications: Patient is currently taking Nexletol 180 mg daily. Patient is intolerant to statins and zetia.  Hypertensive Heart Disease with Chronic Systolic Congestive Heart Failure: Current medications: Patient is currently taking Carvedilol 12.5  mg 1 tablet twice daily, Losartan 50 mg take 1 tablet daily, baby aspirin 81 mg daily,   Depression, mild recurrent: Patient is currently taking Cymbalta 30 mg 1 tablet daily.   GERD: Omeprazole 20 mg daily as needed.       08/16/2022    8:48 AM 04/26/2022    8:44 AM 01/05/2022    7:55 AM 06/23/2021    7:46 AM 08/12/2020    8:07 AM  Depression screen PHQ 2/9  Decreased Interest 0 0 0 0 1  Down, Depressed, Hopeless 1 1 0 0 2  PHQ - 2 Score 1 1 0 0 3  Altered sleeping 0 0   0  Tired, decreased energy 0 0   1  Change in appetite 2 1   1  $ Feeling bad or failure about yourself  0 0   0  Trouble concentrating 0 0   0  Moving slowly or fidgety/restless 2 0   0  Suicidal thoughts 0 0   0  PHQ-9 Score 5 2   5  $ Difficult doing work/chores Somewhat difficult Not difficult at all   Somewhat difficult         03/15/2019    1:00 AM 03/15/2019    8:00 AM 02/05/2020    7:48 AM 08/12/2020    8:07 AM 06/23/2021    7:46 AM  Fall Risk  Falls in the past year?   0 0 0  Was there an injury with Fall?   0  0  Fall Risk Category  Calculator   0  0  Fall Risk Category (Retired)   Low  Low  (RETIRED) Patient Fall Risk Level High fall risk High fall risk Low fall risk  Low fall risk  Patient at Risk for Falls Due to     No Fall Risks  Fall risk Follow up   Falls prevention discussed;Falls evaluation completed  Falls evaluation completed      Review of Systems  Current Outpatient Medications on File Prior to Visit  Medication Sig Dispense Refill   aspirin EC 81 MG tablet Take 1 tablet (81 mg total) by mouth daily. 90 tablet 3   Bempedoic Acid (NEXLETOL) 180 MG TABS Take 108 mg by mouth daily. 90 tablet 3   carvedilol (COREG) 12.5 MG tablet Take 1 tablet (12.5 mg total) by mouth 2 (two) times daily. 60 tablet 11   DULoxetine (CYMBALTA) 30 MG capsule Take 30 mg by mouth daily.     glucose blood test strip 1 each by Other route as needed. Test blood sugar 3 times daily (Patient taking differently: 1 each by Other route as needed for  other (Glucose check). Test blood sugar 3 times daily) 100 each 6   insulin glargine-yfgn (SEMGLEE, YFGN,) 100 UNIT/ML Pen Inject 34 Units into the skin daily. 36 mL 0   Insulin Pen Needle (B-D UF III MINI PEN NEEDLES) 31G X 5 MM MISC 2 g by Does not apply route 4 (four) times daily as needed. (Patient taking differently: 2 g by Does not apply route 4 (four) times daily as needed (For injectable meds).) 100 each 3   LORazepam (ATIVAN) 0.5 MG tablet Take 1 tablet (0.5 mg total) by mouth daily as needed for anxiety. 30 tablet 0   losartan (COZAAR) 50 MG tablet TAKE 1 TABLET(50 MG) BY MOUTH DAILY 90 tablet 3   MAGNESIUM PO Take 1 tablet by mouth daily.     metFORMIN (GLUCOPHAGE) 1000 MG tablet TAKE 1 TABLET(1000 MG) BY MOUTH TWICE DAILY WITH A MEAL 180 tablet 0   nitroGLYCERIN (NITROSTAT) 0.4 MG SL tablet Place 1 tablet (0.4 mg total) under the tongue every 5 (five) minutes as needed. (Patient taking differently: Place 0.4 mg under the tongue every 5 (five) minutes as needed for chest pain.) 25  tablet 12   omeprazole (PRILOSEC) 20 MG capsule Take 20 mg by mouth daily as needed (acid reflux).     OVER THE COUNTER MEDICATION Take 1 tablet by mouth daily. Nutritional Frontier: Cybzyme     OVER THE COUNTER MEDICATION Take 1 tablet by mouth daily. Nutritional Frontier: Calm Day     OVER THE COUNTER MEDICATION Take 1 tablet by mouth daily. Nutritional Frontier: Thermogenesis Complete     OVER THE COUNTER MEDICATION Take 1 tablet by mouth daily. Nutritional Frontier: SPM     No current facility-administered medications on file prior to visit.   Past Medical History:  Diagnosis Date   AICD (automatic cardioverter/defibrillator) present 03/13/2019   CAD (coronary artery disease)    CHF (congestive heart failure) ([place])    Depression    Diabetes mellitus without complication (HCC)    GERD (gastroesophageal reflux disease)    Hypertension    Mixed hyperlipidemia    Myocardial infarction ([place]) 2020   Other hypotension    Smoking history 03/10/2019   Smokes 1/2 pack a day   Ventricular tachycardia [facility])    Past Surgical History:  Procedure Laterality Date   ABDOMINAL HYSTERECTOMY     CATARACT EXTRACTION Bilateral    2016-2017   EYE SURGERY Right 10/2021   "gel leaking"/laser surgery   ICD IMPLANT N/A 03/13/2019   Procedure: ICD IMPLANT;  Surgeon: Constance Haw, MD;  Location: [place] [facility];  Service: Cardiovascular;  Laterality: N/A;   ICD IMPLANT  03/13/2019   LEFT HEART CATH AND CORONARY ANGIOGRAPHY N/A 03/12/2019   Procedure: LEFT HEART CATH AND CORONARY ANGIOGRAPHY;  Surgeon: Wellington Hampshire, MD;  Location: [place] [facility];  Service: Cardiovascular;  Laterality: N/A;    Family History  Problem Relation Age of Onset   Hypertension Mother    Diabetes Mother    COPD Father    Social History   Socioeconomic History   Marital status: Widowed    Spouse name: Not on file   Number of children: Not on file   Years of education: Not on file   Highest  education level: Not on file  Occupational History   Not on file  Tobacco Use   Smoking status: Former    Packs/day: 0.50    Types: Cigarettes    Quit date: 03/2019  Years since quitting: 3.7   Smokeless tobacco: Never  Vaping Use   Vaping Use: Never used  Substance and Sexual Activity   Alcohol use: Not Currently   Drug use: Never   Sexual activity: Not on file  Other Topics Concern   Not on file  Social History Narrative   Not on file   Social Determinants of Health   Financial Resource Strain: Not on file  Food Insecurity: Not on file  Transportation Needs: Not on file  Physical Activity: Not on file  Stress: Not on file  Social Connections: Not on file    Objective:  There were no vitals taken for this visit.     10/30/2022    8:30 AM 09/06/2022    8:23 AM 08/16/2022    8:10 AM  BP/Weight  Systolic BP 123456 0000000 [id number]  Diastolic BP 78 72 70  Wt. (Lbs) 200 197.4   BMI 34.33 kg/m2 33.88 kg/m2     Physical Exam  Diabetic Foot Exam - Simple   No data filed      Lab Results  Component Value Date   WBC 9.2 04/26/2022   HGB 14.1 04/26/2022   HCT 43.1 04/26/2022   PLT 278 04/26/2022   GLUCOSE 117 (H) 04/26/2022   CHOL 187 04/26/2022   TRIG 136 04/26/2022   HDL 57 04/26/2022   LDLCALC 106 (H) 04/26/2022   ALT 11 04/26/2022   AST 14 04/26/2022   NA 138 04/26/2022   K 4.9 04/26/2022   CL 100 04/26/2022   CREATININE 0.64 04/26/2022   BUN 12 04/26/2022   CO2 25 04/26/2022   TSH 2.440 04/26/2022   HGBA1C 7.7 (H) 04/26/2022   MICROALBUR 80 06/23/2021      Assessment & Plan:    Hypertensive heart disease with chronic systolic congestive heart failure ([place]) Assessment & Plan: Well controlled.  No changes to medicines. Losartan 50 mg daily, Carvedilol 12.5 mg twice a day. Continue to work on eating a healthy diet and exercise.  Labs drawn today.     Coronary artery disease involving native coronary artery of native heart without angina  pectoris Assessment & Plan: Continue Carvedilol 3.125 mg 1 tablet twice daily, Nexletol 180 mg daily, aspirin 81 mg daily.    Gastroesophageal reflux disease with esophagitis, unspecified whether hemorrhage Assessment & Plan: The current medical regimen is effective;  continue present plan and medications.    Diabetic glomerulopathy (HCC) Assessment & Plan: Control:  Recommend check sugars fasting daily. Recommend check feet daily. Recommend annual eye exams. Medicines: Metformin 1000 mg 1 tablet twice daily, Semglee 34 units before bed. If sugar is below 150 at night, changes semglee. Continue to work on eating a healthy diet and exercise.  Labs requested      Mixed hyperlipidemia Assessment & Plan: Recommend continue to work on eating healthy diet and exercise. Intolerant to stains and zetia. Continue on Nexletol 180 mg daily. labs draw today.      No orders of the defined types were placed in this encounter.   No orders of the defined types were placed in this encounter.    Follow-up: No follow-ups on file.   I,Marla I Leal-Borjas,acting as a scribe for Rochel Brome, MD.,have documented all relevant documentation on the behalf of Rochel Brome, MD,as directed by  Rochel Brome, MD while in the presence of Rochel Brome, MD.   An After Visit Summary was printed and given to the patient.  Rochel Brome, MD [facility] [phone]  [phone] 

## 2022-12-05 NOTE — Assessment & Plan Note (Addendum)
Recommend continue to work on eating healthy diet and exercise. Intolerant to stains and zetia. Continue on Nexletol 180 mg daily. labs draw today.

## 2022-12-06 ENCOUNTER — Encounter: Payer: Self-pay | Admitting: Family Medicine

## 2022-12-06 ENCOUNTER — Ambulatory Visit: Payer: BC Managed Care – PPO | Admitting: Family Medicine

## 2022-12-06 VITALS — BP 132/68 | HR 91 | Temp 97.1°F | Ht 64.0 in | Wt 200.0 lb

## 2022-12-06 DIAGNOSIS — Z716 Tobacco abuse counseling: Secondary | ICD-10-CM | POA: Insufficient documentation

## 2022-12-06 DIAGNOSIS — I5022 Chronic systolic (congestive) heart failure: Secondary | ICD-10-CM

## 2022-12-06 DIAGNOSIS — K2100 Gastro-esophageal reflux disease with esophagitis, without bleeding: Secondary | ICD-10-CM | POA: Diagnosis not present

## 2022-12-06 DIAGNOSIS — G720 Drug-induced myopathy: Secondary | ICD-10-CM

## 2022-12-06 DIAGNOSIS — T466X5A Adverse effect of antihyperlipidemic and antiarteriosclerotic drugs, initial encounter: Secondary | ICD-10-CM

## 2022-12-06 DIAGNOSIS — I110 Hypertensive heart disease with heart failure: Secondary | ICD-10-CM | POA: Diagnosis not present

## 2022-12-06 DIAGNOSIS — E1121 Type 2 diabetes mellitus with diabetic nephropathy: Secondary | ICD-10-CM | POA: Diagnosis not present

## 2022-12-06 DIAGNOSIS — E66811 Obesity, class 1: Secondary | ICD-10-CM

## 2022-12-06 DIAGNOSIS — E782 Mixed hyperlipidemia: Secondary | ICD-10-CM

## 2022-12-06 DIAGNOSIS — Z6834 Body mass index (BMI) 34.0-34.9, adult: Secondary | ICD-10-CM

## 2022-12-06 DIAGNOSIS — F330 Major depressive disorder, recurrent, mild: Secondary | ICD-10-CM

## 2022-12-06 DIAGNOSIS — M791 Myalgia, unspecified site: Secondary | ICD-10-CM

## 2022-12-06 DIAGNOSIS — F17218 Nicotine dependence, cigarettes, with other nicotine-induced disorders: Secondary | ICD-10-CM

## 2022-12-06 DIAGNOSIS — E6609 Other obesity due to excess calories: Secondary | ICD-10-CM | POA: Insufficient documentation

## 2022-12-06 DIAGNOSIS — F17219 Nicotine dependence, cigarettes, with unspecified nicotine-induced disorders: Secondary | ICD-10-CM | POA: Insufficient documentation

## 2022-12-06 DIAGNOSIS — I2510 Atherosclerotic heart disease of native coronary artery without angina pectoris: Secondary | ICD-10-CM

## 2022-12-06 DIAGNOSIS — F17200 Nicotine dependence, unspecified, uncomplicated: Secondary | ICD-10-CM | POA: Insufficient documentation

## 2022-12-06 DIAGNOSIS — Z95810 Presence of automatic (implantable) cardiac defibrillator: Secondary | ICD-10-CM

## 2022-12-06 MED ORDER — SCOPOLAMINE 1 MG/3DAYS TD PT72: 1.0000 | MEDICATED_PATCH | TRANSDERMAL | 0 refills | Status: DC

## 2022-12-06 NOTE — Assessment & Plan Note (Signed)
Intolerant to statins. 

## 2022-12-06 NOTE — Assessment & Plan Note (Signed)
Recommend cessation. ?

## 2022-12-06 NOTE — Assessment & Plan Note (Signed)
The current medical regimen is effective;  continue present plan and medications. Continue duloxetine 30 mg daily.

## 2022-12-06 NOTE — Assessment & Plan Note (Signed)
Recommended cessation.

## 2022-12-06 NOTE — Assessment & Plan Note (Signed)
Recommend continue to work on eating healthy diet and exercise.  

## 2022-12-08 LAB — CBC WITH DIFFERENTIAL/PLATELET
Basophils Absolute: 0.1 10*3/uL (ref 0.0–0.2)
Basos: 1 %
EOS (ABSOLUTE): 0.2 10*3/uL (ref 0.0–0.4)
Eos: 2 %
Hematocrit: 41.5 % (ref 34.0–46.6)
Hemoglobin: 13.7 g/dL (ref 11.1–15.9)
Immature Grans (Abs): 0 10*3/uL (ref 0.0–0.1)
Immature Granulocytes: 0 %
Lymphocytes Absolute: 2.3 10*3/uL (ref 0.7–3.1)
Lymphs: 30 %
MCH: 28.4 pg (ref 26.6–33.0)
MCHC: 33 g/dL (ref 31.5–35.7)
MCV: 86 fL (ref 79–97)
Monocytes Absolute: 0.5 10*3/uL (ref 0.1–0.9)
Monocytes: 7 %
Neutrophils Absolute: 4.7 10*3/uL (ref 1.4–7.0)
Neutrophils: 60 %
Platelets: 252 10*3/uL (ref 150–450)
RBC: 4.82 x10E6/uL (ref 3.77–5.28)
RDW: 12.9 % (ref 11.7–15.4)
WBC: 7.7 10*3/uL (ref 3.4–10.8)

## 2022-12-08 LAB — COMPREHENSIVE METABOLIC PANEL
ALT: 12 IU/L (ref 0–32)
AST: 14 IU/L (ref 0–40)
Albumin/Globulin Ratio: 1.8 (ref 1.2–2.2)
Albumin: 4.2 g/dL (ref 3.9–4.9)
Alkaline Phosphatase: 111 IU/L (ref 44–121)
BUN/Creatinine Ratio: 14 (ref 12–28)
BUN: 9 mg/dL (ref 8–27)
Bilirubin Total: 0.3 mg/dL (ref 0.0–1.2)
CO2: 26 mmol/L (ref 20–29)
Calcium: 8.9 mg/dL (ref 8.7–10.3)
Chloride: 99 mmol/L (ref 96–106)
Creatinine, Ser: 0.63 mg/dL (ref 0.57–1.00)
Globulin, Total: 2.4 g/dL (ref 1.5–4.5)
Glucose: 137 mg/dL — ABNORMAL HIGH (ref 70–99)
Potassium: 4.8 mmol/L (ref 3.5–5.2)
Sodium: 137 mmol/L (ref 134–144)
Total Protein: 6.6 g/dL (ref 6.0–8.5)
eGFR: 100 mL/min/{1.73_m2} (ref >59)

## 2022-12-08 LAB — LIPID PANEL
Chol/HDL Ratio: 3.4 ratio (ref 0.0–4.4)
Cholesterol, Total: 184 mg/dL (ref 100–199)
HDL: 54 mg/dL (ref >39)
LDL Chol Calc (NIH): 108 mg/dL — ABNORMAL HIGH (ref 0–99)
Triglycerides: 121 mg/dL (ref 0–149)
VLDL Cholesterol Cal: 22 mg/dL (ref 5–40)

## 2022-12-08 LAB — MICROALBUMIN / CREATININE URINE RATIO
Creatinine, Urine: 119.9 mg/dL
Microalb/Creat Ratio: 153 mg/g creat — ABNORMAL HIGH (ref 0–29)
Microalbumin, Urine: 183.7 ug/mL

## 2022-12-08 LAB — HEMOGLOBIN A1C
Est. average glucose Bld gHb Est-mCnc: 186 mg/dL
Hgb A1c MFr Bld: 8.1 % — ABNORMAL HIGH (ref 4.8–5.6)

## 2022-12-08 LAB — TSH: TSH: 2.72 u[IU]/mL (ref 0.450–4.500)

## 2022-12-08 LAB — MAGNESIUM: Magnesium: 1.4 mg/dL — ABNORMAL LOW (ref 1.6–2.3)

## 2022-12-08 LAB — CARDIOVASCULAR RISK ASSESSMENT

## 2022-12-10 NOTE — Progress Notes (Signed)
Blood count normal.  Liver function normal.  Kidney function normal.  Thyroid function normal.  Cholesterol: LDL still a little too high, but improved. Recommend less than 55 due to her CORONARY ARTERY DISEASE. Recommend continue nexlitol. HBA1C: 8.1. Poorly controlled. Recommend increase insulin (semglee) to 30 U dailyl. Continue metformin 1000 mg twice daily.  Spilling more protein in urine. Increase losartan to 100 mg daily.  Magnesium low. Find out how much magnesium she is taking an double dose.

## 2022-12-11 ENCOUNTER — Other Ambulatory Visit: Payer: Self-pay

## 2023-01-05 ENCOUNTER — Other Ambulatory Visit: Payer: Self-pay

## 2023-01-05 MED ORDER — DULOXETINE HCL 30 MG PO CPEP: 30.0000 mg | ORAL_CAPSULE | Freq: Every day | ORAL | 0 refills | Status: DC

## 2023-01-05 MED ORDER — LOSARTAN POTASSIUM 100 MG PO TABS: 100.0000 mg | ORAL_TABLET | Freq: Every day | ORAL | 0 refills | Status: DC

## 2023-01-15 ENCOUNTER — Ambulatory Visit (INDEPENDENT_AMBULATORY_CARE_PROVIDER_SITE_OTHER): Payer: BC Managed Care – PPO

## 2023-01-15 DIAGNOSIS — I255 Ischemic cardiomyopathy: Secondary | ICD-10-CM

## 2023-01-15 DIAGNOSIS — I472 Ventricular tachycardia, unspecified: Secondary | ICD-10-CM

## 2023-01-17 LAB — CUP PACEART REMOTE DEVICE CHECK
Battery Remaining Longevity: 68 mo
Battery Remaining Percentage: 67 %
Battery Voltage: 2.98 V
Brady Statistic RV Percent Paced: 0 %
Date Time Interrogation Session: 20240409214820
HighPow Impedance: 83 Ohm
HighPow Impedance: 83 Ohm
Implantable Lead Connection Status: 753985
Implantable Lead Implant Date: 20200604
Implantable Lead Location: 753860
Implantable Pulse Generator Implant Date: 20200604
Lead Channel Impedance Value: 490 Ohm
Lead Channel Pacing Threshold Amplitude: 0.5 V
Lead Channel Pacing Threshold Pulse Width: 0.5 ms
Lead Channel Sensing Intrinsic Amplitude: 11.3 mV
Lead Channel Setting Pacing Amplitude: 2.5 V
Lead Channel Setting Pacing Pulse Width: 0.5 ms
Lead Channel Setting Sensing Sensitivity: 0.5 mV
Pulse Gen Serial Number: 9860889
Zone Setting Status: 755011

## 2023-02-10 ENCOUNTER — Other Ambulatory Visit: Payer: Self-pay | Admitting: Family Medicine

## 2023-02-19 NOTE — Progress Notes (Signed)
Remote ICD transmission.   

## 2023-03-15 ENCOUNTER — Other Ambulatory Visit: Payer: Self-pay | Admitting: Family Medicine

## 2023-03-15 ENCOUNTER — Other Ambulatory Visit: Payer: Self-pay

## 2023-03-15 MED ORDER — LORAZEPAM 0.5 MG PO TABS: 0.5000 mg | ORAL_TABLET | Freq: Every day | ORAL | 3 refills | Status: AC | PRN

## 2023-03-21 ENCOUNTER — Ambulatory Visit: Payer: BC Managed Care – PPO | Admitting: Family Medicine

## 2023-03-28 ENCOUNTER — Telehealth: Payer: Self-pay | Admitting: *Deleted

## 2023-03-28 NOTE — Telephone Encounter (Signed)
Spoke with pt about samples we have for her for Nexlitol. Pt state she did not want them. Will put them back in sample closet.

## 2023-04-16 ENCOUNTER — Ambulatory Visit (INDEPENDENT_AMBULATORY_CARE_PROVIDER_SITE_OTHER): Payer: BC Managed Care – PPO

## 2023-04-16 DIAGNOSIS — I255 Ischemic cardiomyopathy: Secondary | ICD-10-CM | POA: Diagnosis not present

## 2023-04-23 LAB — CUP PACEART REMOTE DEVICE CHECK
Battery Remaining Longevity: 66 mo
Battery Remaining Percentage: 65 %
Battery Voltage: 2.98 V
Brady Statistic RV Percent Paced: 1 %
Date Time Interrogation Session: 20240713222126
HighPow Impedance: 83 Ohm
HighPow Impedance: 83 Ohm
Implantable Lead Connection Status: 753985
Implantable Lead Implant Date: 20200604
Implantable Lead Location: 753860
Implantable Pulse Generator Implant Date: 20200604
Lead Channel Impedance Value: 510 Ohm
Lead Channel Pacing Threshold Amplitude: 0.5 V
Lead Channel Pacing Threshold Pulse Width: 0.5 ms
Lead Channel Sensing Intrinsic Amplitude: 11.3 mV
Lead Channel Setting Pacing Amplitude: 2.5 V
Lead Channel Setting Pacing Pulse Width: 0.5 ms
Lead Channel Setting Sensing Sensitivity: 0.5 mV
Pulse Gen Serial Number: 9860889
Zone Setting Status: 755011

## 2023-05-01 NOTE — Progress Notes (Signed)
Remote ICD transmission.   

## 2023-05-21 ENCOUNTER — Ambulatory Visit: Payer: BC Managed Care – PPO | Admitting: Family Medicine

## 2023-06-12 ENCOUNTER — Other Ambulatory Visit: Payer: Self-pay | Admitting: Family Medicine

## 2023-06-12 NOTE — Assessment & Plan Note (Addendum)
Recommend continue to work on eating healthy diet and exercise. Consider restarting nexlitol labs draw today.

## 2023-06-12 NOTE — Assessment & Plan Note (Addendum)
Well controlled.  Continue Carvedilol 3.125 mg 1 tablet twice daily and aspirin 81 mg daily. Management per specialist.

## 2023-06-12 NOTE — Assessment & Plan Note (Signed)
The current medical regimen is effective;  continue present plan and medications.  Continue omeprazole 20 mg daily as needed.

## 2023-06-12 NOTE — Assessment & Plan Note (Addendum)
Control: good Recommend check sugars fasting daily. Recommend check feet daily. Recommend annual eye exams. Medicines: Metformin 1000 mg 1 tablet twice daily, Semglee 22 units before bed. C ontinue to work on eating a healthy diet and exercise.  Labs ordered.

## 2023-06-12 NOTE — Progress Notes (Signed)
Subjective:  Patient ID: Melinda Nguyen, female    DOB: 06-16-1961  Age: 62 y.o. MRN: [id number]  Chief Complaint  Patient presents with   Medical Management of Chronic Issues    HPI Diabetes:  Complications: Glomerulopathy Glucose checking: Patient has not been checking it regularly  Glucose logs: 100-180 Most recent A1C: 8.1 Current medications: Metformin 1000 mg 1 tablet twice daily, Semglee 24 units before bed. (Rx is written for 34 units) If sugar is below 150 at night, holds evening metformin.  Foot checks: daily Eye visit: Stephannie Li, MD  Not eating healthy.   Hyperlipidemia: Current medications:Is not taking Nexletol 180 mg daily. Patient is intolerant to statins. Taking black seed oil and magnesium 400 mg daily.   Hypertensive Heart Disease with Chronic Systolic Congestive Heart Failure: Current medications: Patient is currently taking Carvedilol 12.5  mg 1 tablet twice daily, Losartan 50 mg take 1 tablet daily, baby aspirin 81 mg daily, see's Dr. Elberta Fortis once a year and Dr. Bing Matter every 6 months.  Depression, mild recurrent: Patient is currently taking Cymbalta 30 mg 1 tablet daily.   GERD: Omeprazole 20 mg daily as needed.      06/13/2023    8:01 AM 12/06/2022    8:38 AM 12/06/2022    8:37 AM 08/16/2022    8:48 AM 04/26/2022    8:44 AM  Depression screen PHQ 2/9  Decreased Interest 0 0 0 0 0  Down, Depressed, Hopeless 1 0 0 1 1  PHQ - 2 Score 1 0 0 1 1  Altered sleeping 1 0  0 0  Tired, decreased energy 1 0  0 0  Change in appetite 0 0  2 1  Feeling bad or failure about yourself  0 0  0 0  Trouble concentrating 0 0  0 0  Moving slowly or fidgety/restless 0 0  2 0  Suicidal thoughts 0 0  0 0  PHQ-9 Score 3 0  5 2  Difficult doing work/chores Somewhat difficult Not difficult at all  Somewhat difficult Not difficult at all        06/13/2023    8:00 AM  Fall Risk   Falls in the past year? 1  Number falls in past yr: 0  Injury with Fall? 0  Risk for fall  due to : History of fall(s)  Follow up Falls evaluation completed;Falls prevention discussed    Patient Care Team: CoxFritzi Mandes, MD as PCP - General (Family Medicine) Regan Lemming, MD as PCP - Electrophysiology (Cardiology)   Review of Systems  Constitutional:  Negative for chills, fatigue and fever.  HENT:  Negative for congestion, ear pain, rhinorrhea and sore throat.   Respiratory:  Negative for cough and shortness of breath.   Cardiovascular:  Negative for chest pain.  Gastrointestinal:  Negative for abdominal pain, constipation, diarrhea, nausea and vomiting.  Genitourinary:  Negative for dysuria and urgency.  Musculoskeletal:  Negative for back pain and myalgias.  Neurological:  Negative for dizziness, weakness, light-headedness and headaches.  Psychiatric/Behavioral:  Negative for dysphoric mood. The patient is not nervous/anxious.     Current Outpatient Medications on File Prior to Visit  Medication Sig Dispense Refill   aspirin EC 81 MG tablet Take 1 tablet (81 mg total) by mouth daily. 90 tablet 3   carvedilol (COREG) 12.5 MG tablet Take 1 tablet (12.5 mg total) by mouth 2 (two) times daily. 60 tablet 11   DULoxetine (CYMBALTA) 30 MG capsule TAKE 1 CAPSULE(30 MG) BY  MOUTH DAILY 90 capsule 0   glucose blood test strip 1 each by Other route as needed. Test blood sugar 3 times daily (Patient taking differently: 1 each by Other route as needed for other (Glucose check). Test blood sugar 3 times daily) 100 each 6   insulin glargine-yfgn (SEMGLEE, YFGN,) 100 UNIT/ML Pen INJECT 34 UNITS INTO THE SKIN DAILY 30 mL 1   Insulin Pen Needle (B-D UF III MINI PEN NEEDLES) 31G X 5 MM MISC 2 g by Does not apply route 4 (four) times daily as needed. (Patient taking differently: 2 g by Does not apply route 4 (four) times daily as needed (For injectable meds).) 100 each 3   LORazepam (ATIVAN) 0.5 MG tablet Take 1 tablet (0.5 mg total) by mouth daily as needed. 30 tablet 3   losartan  (COZAAR) 100 MG tablet TAKE 1 TABLET(100 MG) BY MOUTH DAILY 90 tablet 0   MAGNESIUM PO Take 1 tablet by mouth daily.     metFORMIN (GLUCOPHAGE) 1000 MG tablet TAKE 1 TABLET(1000 MG) BY MOUTH TWICE DAILY WITH A MEAL 180 tablet 0   nitroGLYCERIN (NITROSTAT) 0.4 MG SL tablet Place 1 tablet (0.4 mg total) under the tongue every 5 (five) minutes as needed. (Patient taking differently: Place 0.4 mg under the tongue every 5 (five) minutes as needed for chest pain.) 25 tablet 12   omeprazole (PRILOSEC) 20 MG capsule Take 20 mg by mouth daily as needed (acid reflux).     OVER THE COUNTER MEDICATION Take 1 tablet by mouth daily. Nutritional Frontier: Cybzyme     OVER THE COUNTER MEDICATION Take 1 tablet by mouth daily. Nutritional Frontier: Calm Day     OVER THE COUNTER MEDICATION Take 1 tablet by mouth daily. Nutritional Frontier: Thermogenesis Complete     OVER THE COUNTER MEDICATION Take 1 tablet by mouth daily. Nutritional Frontier: SPM     scopolamine (TRANSDERM-SCOP) 1 MG/3DAYS Place 1 patch (1.5 mg total) onto the skin every 3 (three) days. 4 patch 0   No current facility-administered medications on file prior to visit.   Past Medical History:  Diagnosis Date   AICD (automatic cardioverter/defibrillator) present 03/13/2019   CAD (coronary artery disease)    CHF (congestive heart failure) (HCC)    Depression    Diabetes mellitus without complication (HCC)    GERD (gastroesophageal reflux disease)    Hypertension    Mixed hyperlipidemia    Myocardial infarction (HCC) 2020   Other hypotension    Smoking history 03/10/2019   Smokes 1/2 pack a day   Ventricular tachycardia [facility])    Past Surgical History:  Procedure Laterality Date   ABDOMINAL HYSTERECTOMY     CATARACT EXTRACTION Bilateral    2016-2017   EYE SURGERY Right 10/2021   "gel leaking"/laser surgery   ICD IMPLANT N/A 03/13/2019   Procedure: ICD IMPLANT;  Surgeon: Regan Lemming, MD;  Location: [facility];  Service:  Cardiovascular;  Laterality: N/A;   ICD IMPLANT  03/13/2019   LEFT HEART CATH AND CORONARY ANGIOGRAPHY N/A 03/12/2019   Procedure: LEFT HEART CATH AND CORONARY ANGIOGRAPHY;  Surgeon: Iran Ouch, MD;  Location: [facility];  Service: Cardiovascular;  Laterality: N/A;    Family History  Problem Relation Age of Onset   Hypertension Mother    Diabetes Mother    COPD Father    Social History   Socioeconomic History   Marital status: Widowed    Spouse name: Not on file   Number  of children: Not on file   Years of education: Not on file   Highest education level: Not on file  Occupational History   Not on file  Tobacco Use   Smoking status: Every Day    Current packs/day: 0.50    Average packs/day: 0.5 packs/day for 1.4 years (0.7 ttl pk-yrs)    Types: Cigarettes    Start date: 01/2022    Last attempt to quit: 03/2019   Smokeless tobacco: Never  Vaping Use   Vaping status: Never Used  Substance and Sexual Activity   Alcohol use: Not Currently   Drug use: Never   Sexual activity: Not on file  Other Topics Concern   Not on file  Social History Narrative   Not on file   Social Determinants of Health   Financial Resource Strain: Low Risk  (12/06/2022)   Overall Financial Resource Strain (CARDIA)    Difficulty of Paying Living Expenses: Not hard at all  Food Insecurity: No Food Insecurity (12/06/2022)   Hunger Vital Sign    Worried About Running Out of Food in the Last Year: Never true    Ran Out of Food in the Last Year: Never true  Transportation Needs: No Transportation Needs (12/06/2022)   PRAPARE - Administrator, Civil Service (Medical): No    Lack of Transportation (Non-Medical): No  Physical Activity: Sufficiently Active (12/06/2022)   Exercise Vital Sign    Days of Exercise per Week: 3 days    Minutes of Exercise per Session: 60 min  Stress: No Stress Concern Present (12/06/2022)   [employer] of Occupational Health - Occupational  Stress Questionnaire    Feeling of Stress : Not at all  Social Connections: Moderately Isolated (12/06/2022)   Social Connection and Isolation Panel [NHANES]    Frequency of Communication with Friends and Family: Three times a week    Frequency of Social Gatherings with Friends and Family: Three times a week    Attends Religious Services: More than 4 times per year    Active Member of Clubs or Organizations: No    Attends Banker Meetings: Never    Marital Status: Widowed    Objective:  BP 128/62   Pulse 88   Temp (!) 97.5 F (36.4 C)   Ht 5\' 4"  (1.626 m)   Wt 197 lb (89.4 kg)   LMP  (LMP Unknown)   SpO2 96%   BMI 33.81 kg/m      06/13/2023    7:57 AM 12/06/2022    8:36 AM 10/30/2022    8:30 AM  BP/Weight  Systolic BP 128 132 146  Diastolic BP 62 68 78  Wt. (Lbs) 197 200 200  BMI 33.81 kg/m2 34.33 kg/m2 34.33 kg/m2    Physical Exam Vitals reviewed.  Constitutional:      Appearance: Normal appearance. She is obese.  Neck:     Vascular: No carotid bruit.  Cardiovascular:     Rate and Rhythm: Normal rate and regular rhythm.     Heart sounds: Normal heart sounds.  Pulmonary:     Effort: Pulmonary effort is normal. No respiratory distress.     Breath sounds: Normal breath sounds.  Abdominal:     General: Abdomen is flat. Bowel sounds are normal.     Palpations: Abdomen is soft.     Tenderness: There is no abdominal tenderness.  Neurological:     Mental Status: She is alert and oriented to person, place, and time.  Psychiatric:  Mood and Affect: Mood normal.        Behavior: Behavior normal.     Diabetic Foot Exam - Simple   No data filed      Lab Results  Component Value Date   WBC 7.7 12/06/2022   HGB 13.7 12/06/2022   HCT 41.5 12/06/2022   PLT 252 12/06/2022   GLUCOSE 137 (H) 12/06/2022   CHOL 184 12/06/2022   TRIG 121 12/06/2022   HDL 54 12/06/2022   LDLCALC 108 (H) 12/06/2022   ALT 12 12/06/2022   AST 14 12/06/2022   NA 137  12/06/2022   K 4.8 12/06/2022   CL 99 12/06/2022   CREATININE 0.63 12/06/2022   BUN 9 12/06/2022   CO2 26 12/06/2022   TSH 2.720 12/06/2022   HGBA1C 8.1 (H) 12/06/2022   MICROALBUR 80 06/23/2021      Assessment & Plan:    Hypertensive heart disease with chronic systolic congestive heart failure (HCC) Assessment & Plan: Well controlled.  No changes to medicines. Losartan 50 mg daily, Carvedilol 12.5 mg twice a day. Continue to work on eating a healthy diet and exercise.  Labs drawn today.    Orders: -     CBC with Differential/Platelet -     Comprehensive metabolic panel  Coronary artery disease involving native coronary artery of native heart without angina pectoris Assessment & Plan: Well controlled.  Continue Carvedilol 3.125 mg 1 tablet twice daily and aspirin 81 mg daily. Management per specialist.     Diabetic glomerulopathy (HCC) Assessment & Plan: Control: good Recommend check sugars fasting daily. Recommend check feet daily. Recommend annual eye exams. Medicines: Metformin 1000 mg 1 tablet twice daily, Semglee 22 units before bed. C ontinue to work on eating a healthy diet and exercise.  Labs ordered.   Orders: -     Hemoglobin A1c -     Microalbumin / creatinine urine ratio  Mixed hyperlipidemia Assessment & Plan: Recommend continue to work on eating healthy diet and exercise. Consider restarting nexlitol labs draw today.  Orders: -     Lipid panel  Gastroesophageal reflux disease with esophagitis, unspecified whether hemorrhage Assessment & Plan: The current medical regimen is effective;  continue present plan and medications.  Continue omeprazole 20 mg daily as needed.    Depression, major, recurrent, mild (HCC) Assessment & Plan: The current medical regimen is effective;  continue present plan and medications. Continue duloxetine 30 mg daily.    Statin myopathy Assessment & Plan: Intolerant to statins   Class 1 obesity due to  excess calories with serious comorbidity and body mass index (BMI) of 33.0 to 33.9 in adult Assessment & Plan: Recommend continue to work on eating healthy diet and exercise.       No orders of the defined types were placed in this encounter.   Orders Placed This Encounter  Procedures   CBC with Differential/Platelet   Comprehensive metabolic panel   Hemoglobin A1c   Lipid panel   Microalbumin / creatinine urine ratio     Follow-up: Return in about 3 months (around 09/12/2023) for chronic follow up.   I,Marla I Leal-Borjas,acting as a scribe for Blane Ohara, MD.,have documented all relevant documentation on the behalf of Blane Ohara, MD,as directed by  Blane Ohara, MD while in the presence of Blane Ohara, MD.   An After Visit Summary was printed and given to the patient.  Blane Ohara, MD [facility] [phone]

## 2023-06-13 ENCOUNTER — Encounter: Payer: Self-pay | Admitting: Family Medicine

## 2023-06-13 ENCOUNTER — Ambulatory Visit: Payer: BC Managed Care – PPO | Admitting: Family Medicine

## 2023-06-13 VITALS — BP 128/62 | HR 88 | Temp 97.5°F | Ht 64.0 in | Wt 197.0 lb

## 2023-06-13 DIAGNOSIS — I2510 Atherosclerotic heart disease of native coronary artery without angina pectoris: Secondary | ICD-10-CM | POA: Diagnosis not present

## 2023-06-13 DIAGNOSIS — E782 Mixed hyperlipidemia: Secondary | ICD-10-CM

## 2023-06-13 DIAGNOSIS — K2100 Gastro-esophageal reflux disease with esophagitis, without bleeding: Secondary | ICD-10-CM

## 2023-06-13 DIAGNOSIS — I110 Hypertensive heart disease with heart failure: Secondary | ICD-10-CM | POA: Diagnosis not present

## 2023-06-13 DIAGNOSIS — G720 Drug-induced myopathy: Secondary | ICD-10-CM

## 2023-06-13 DIAGNOSIS — E1121 Type 2 diabetes mellitus with diabetic nephropathy: Secondary | ICD-10-CM | POA: Diagnosis not present

## 2023-06-13 DIAGNOSIS — F330 Major depressive disorder, recurrent, mild: Secondary | ICD-10-CM

## 2023-06-13 DIAGNOSIS — E6609 Other obesity due to excess calories: Secondary | ICD-10-CM

## 2023-06-13 DIAGNOSIS — T466X5A Adverse effect of antihyperlipidemic and antiarteriosclerotic drugs, initial encounter: Secondary | ICD-10-CM

## 2023-06-13 DIAGNOSIS — Z6833 Body mass index (BMI) 33.0-33.9, adult: Secondary | ICD-10-CM

## 2023-06-13 DIAGNOSIS — I5022 Chronic systolic (congestive) heart failure: Secondary | ICD-10-CM

## 2023-06-13 NOTE — Assessment & Plan Note (Signed)
Recommend continue to work on eating healthy diet and exercise.  

## 2023-06-13 NOTE — Patient Instructions (Signed)
Diabetic recommendations: Visits with PCP every 3-6 months depending on Hemoglobin A1C control (goal is < 7.0.) Dietary avoidance/limitation of sugar and carbohydrates, fat, and salt. Exercise (aerobic) 3-5 days per week.  Kidney Care:  Urine microalbumin (protein) should be checked at least annually. To treat or prevent nephropathy (leaking of protein in urine) - all patients should be on an ACE or an ARB Medicines. Heart Disease Prevention: All diabetics should be on a statin cholesterol medicine regardless of cholesterol levels. If cholesterol levels are high, recommend treat to goal of LDL cholesterol level of less than 70. Diabetic Eye Care: See an eye doctor at least annually. More if recommended by the eye doctor.  Foot Care: Check feet visually daily. Blood pressure (hypertension) control < 130/85 Vaccinations: annual flu shot, pneumovax 23, tetanus (tdap)   

## 2023-06-13 NOTE — Assessment & Plan Note (Signed)
The current medical regimen is effective;  continue present plan and medications. Continue duloxetine 30 mg daily.

## 2023-06-13 NOTE — Assessment & Plan Note (Signed)
Intolerant to statins. 

## 2023-06-13 NOTE — Assessment & Plan Note (Signed)
Well controlled.  No changes to medicines. Losartan 50 mg daily, Carvedilol 12.5 mg twice a day. Continue to work on eating a healthy diet and exercise.  Labs drawn today.

## 2023-06-14 LAB — COMPREHENSIVE METABOLIC PANEL
ALT: 10 IU/L (ref 0–32)
AST: 12 IU/L (ref 0–40)
Albumin: 4 g/dL (ref 3.9–4.9)
Alkaline Phosphatase: 102 IU/L (ref 44–121)
BUN/Creatinine Ratio: 14 (ref 12–28)
BUN: 9 mg/dL (ref 8–27)
Bilirubin Total: 0.3 mg/dL (ref 0.0–1.2)
CO2: 26 mmol/L (ref 20–29)
Calcium: 8.9 mg/dL (ref 8.7–10.3)
Chloride: 101 mmol/L (ref 96–106)
Creatinine, Ser: 0.63 mg/dL (ref 0.57–1.00)
Globulin, Total: 2.6 g/dL (ref 1.5–4.5)
Glucose: 81 mg/dL (ref 70–99)
Potassium: 4.7 mmol/L (ref 3.5–5.2)
Sodium: 140 mmol/L (ref 134–144)
Total Protein: 6.6 g/dL (ref 6.0–8.5)
eGFR: 100 mL/min/{1.73_m2} (ref >59)

## 2023-06-14 LAB — CBC WITH DIFFERENTIAL/PLATELET
Basophils Absolute: 0.1 10*3/uL (ref 0.0–0.2)
Basos: 1 %
EOS (ABSOLUTE): 0.2 10*3/uL (ref 0.0–0.4)
Eos: 3 %
Hematocrit: 43.1 % (ref 34.0–46.6)
Hemoglobin: 14.2 g/dL (ref 11.1–15.9)
Immature Grans (Abs): 0 10*3/uL (ref 0.0–0.1)
Immature Granulocytes: 0 %
Lymphocytes Absolute: 2.3 10*3/uL (ref 0.7–3.1)
Lymphs: 25 %
MCH: 29.2 pg (ref 26.6–33.0)
MCHC: 32.9 g/dL (ref 31.5–35.7)
MCV: 89 fL (ref 79–97)
Monocytes Absolute: 0.7 10*3/uL (ref 0.1–0.9)
Monocytes: 7 %
Neutrophils Absolute: 5.8 10*3/uL (ref 1.4–7.0)
Neutrophils: 64 %
Platelets: 280 10*3/uL (ref 150–450)
RBC: 4.86 x10E6/uL (ref 3.77–5.28)
RDW: 13.2 % (ref 11.7–15.4)
WBC: 9.1 10*3/uL (ref 3.4–10.8)

## 2023-06-14 LAB — LIPID PANEL
Chol/HDL Ratio: 3.1 ratio (ref 0.0–4.4)
Cholesterol, Total: 162 mg/dL (ref 100–199)
HDL: 52 mg/dL (ref >39)
LDL Chol Calc (NIH): 89 mg/dL (ref 0–99)
Triglycerides: 116 mg/dL (ref 0–149)
VLDL Cholesterol Cal: 21 mg/dL (ref 5–40)

## 2023-06-14 LAB — MICROALBUMIN / CREATININE URINE RATIO
Creatinine, Urine: 82.8 mg/dL
Microalb/Creat Ratio: 95 mg/g{creat} — ABNORMAL HIGH (ref 0–29)
Microalbumin, Urine: 78.6 ug/mL

## 2023-06-14 LAB — HEMOGLOBIN A1C
Est. average glucose Bld gHb Est-mCnc: 192 mg/dL
Hgb A1c MFr Bld: 8.3 % — ABNORMAL HIGH (ref 4.8–5.6)

## 2023-06-28 ENCOUNTER — Other Ambulatory Visit: Payer: Self-pay | Admitting: Family Medicine

## 2023-07-10 DIAGNOSIS — E113513 Type 2 diabetes mellitus with proliferative diabetic retinopathy with macular edema, bilateral: Secondary | ICD-10-CM | POA: Diagnosis not present

## 2023-07-10 DIAGNOSIS — H43822 Vitreomacular adhesion, left eye: Secondary | ICD-10-CM | POA: Diagnosis not present

## 2023-07-10 DIAGNOSIS — H35372 Puckering of macula, left eye: Secondary | ICD-10-CM | POA: Diagnosis not present

## 2023-07-16 ENCOUNTER — Ambulatory Visit (INDEPENDENT_AMBULATORY_CARE_PROVIDER_SITE_OTHER): Payer: BC Managed Care – PPO

## 2023-07-16 DIAGNOSIS — I472 Ventricular tachycardia, unspecified: Secondary | ICD-10-CM

## 2023-07-16 DIAGNOSIS — I255 Ischemic cardiomyopathy: Secondary | ICD-10-CM

## 2023-07-16 LAB — CUP PACEART REMOTE DEVICE CHECK
Battery Remaining Longevity: 65 mo
Battery Remaining Percentage: 63 %
Battery Voltage: 2.96 V
Brady Statistic RV Percent Paced: 1 %
Date Time Interrogation Session: 20241007040015
HighPow Impedance: 83 Ohm
HighPow Impedance: 83 Ohm
Implantable Lead Connection Status: 753985
Implantable Lead Implant Date: 20200604
Implantable Lead Location: 753860
Implantable Pulse Generator Implant Date: 20200604
Lead Channel Impedance Value: 460 Ohm
Lead Channel Pacing Threshold Amplitude: 0.5 V
Lead Channel Pacing Threshold Pulse Width: 0.5 ms
Lead Channel Sensing Intrinsic Amplitude: 11.3 mV
Lead Channel Setting Pacing Amplitude: 2.5 V
Lead Channel Setting Pacing Pulse Width: 0.5 ms
Lead Channel Setting Sensing Sensitivity: 0.5 mV
Pulse Gen Serial Number: 9860889
Zone Setting Status: 755011

## 2023-07-27 NOTE — Progress Notes (Signed)
Remote ICD transmission.   

## 2023-07-27 NOTE — Progress Notes (Signed)
 " Cardiology Office Note:  .   Date:  07/30/2023  ID:  Orie Fell, DOB 1960/12/12, MRN [id number] PCP: Sherre Clapper, MD  [facility] Providers Cardiologist:  Lamar Fitch, MD Electrophysiologist:  Soyla Gladis Norton, MD    History of Present Illness: Melinda Nguyen is a 62 y.o. female with a past medical history of NSVT, CAD, ischemic cardiomyopathy, s/p ICD, GERD, nicotine abuse, dyslipidemia with statin myopathy.  07/16/2023 device check-normal device functioning, 1 brief NSVT, no therapy changes 09/27/2021 echo EF 60 to 65%, mild concentric LVH, grade 1 DD 03/14/2019 ICD implantation 03/12/2019 left heart cath significant one-vessel CAD with occluded mid left circumflex, mild LAD and RCA disease  Most recently evaluated by Dr. Norton on 10/30/2022.  [facility] with [facility] after presenting to the hospital in July 2020 with nausea and vomiting, she was noted to be in V. tach, she underwent left heart cath as outlined above, s/p [employer] ICD implanted on 03/13/2019.  She was doing well from a cardiac perspective during this visit, she did note that she had increased blood pressure readings at home and her carvedilol  was increased to 12.5 mg twice daily, but otherwise had no complaints, advised to follow-up in 1 year.  She presents today for follow-up of her CAD and NSVT.  She has been doing well from a cardiac perspective although she has had a relatively stressful year, caring for her mother who passed away in Jun 24, 2025 of this year.  Due to that stress, she did resume smoking however she is actively trying to cut back again.  Offered her pharmacological assistance with this however she politely declines and feels she can stop smoking on her own volition again. She denies chest pain, palpitations, dyspnea, pnd, orthopnea, n, v, dizziness, syncope, edema, weight gain, or early satiety.   ROS: Review of Systems  All other systems reviewed and are negative.    Studies Reviewed: .         Cardiac Studies & Procedures   CARDIAC CATHETERIZATION  CARDIAC CATHETERIZATION 03/12/2019  Narrative  Prox Cx to Mid Cx lesion is 100% stenosed.  Prox LAD lesion is 30% stenosed.  Mid RCA lesion is 30% stenosed.  1.  Significant one-vessel coronary artery disease with an occluded mid left circumflex with some antegrade flow via the vessel likely due to spontaneous recanalization.  Mild LAD and RCA disease. 2.  Moderately elevated left ventricular end-diastolic pressure at 26 mmHg.  Left ventricular angiography was not performed due to renal failure.  EF was 40% by echo.  Recommendations: I suspect that the patient had a left circumflex infarct recently.  No indication for revascularization at the present time although this can be considered in the future for refractory angina. Recommend medical therapy for coronary artery disease and cardiomyopathy. I started small dose carvedilol . Only 20 mL of contrast was used for the procedure.  I am not going to hydrate the patient postprocedure given elevated LVEDP.  Findings Coronary Findings Diagnostic  Dominance: Right  Left Main Vessel is angiographically normal.  Left Anterior Descending Prox LAD lesion is 30% stenosed.  Left Circumflex Prox Cx to Mid Cx lesion is 100% stenosed.  Right Coronary Artery Mid RCA lesion is 30% stenosed.  Right Posterior Descending Artery Vessel is angiographically normal.  Inferior Septal Vessel is angiographically normal.  Right Posterior Atrioventricular Artery Vessel is angiographically normal.  Intervention  No interventions have been documented.     ECHOCARDIOGRAM  ECHOCARDIOGRAM COMPLETE 09/27/2021  Narrative  ECHOCARDIOGRAM REPORT    Patient Name:   Melinda Nguyen Date of Exam: 09/27/2021 Medical Rec #:  [id number]    Height:       64.0 in Accession #:    [id number]   Weight:       193.4 lb Date of Birth:  19-Nov-1960     BSA:          1.929 m Patient Age:    60 years      BP:           138/74 mmHg Patient Gender: F            HR:           98 bpm. Exam Location:  [facility]  Procedure: 2D Echo, Strain Analysis, Cardiac Doppler and Color Doppler  Indications:    Cardiomyopathy-Ischemic I25.5  History:        Patient has prior history of Echocardiogram examinations, most recent 02/02/2020. Signs/Symptoms:Hypertensive heart disease with chronic systolic congestive heart failure; Risk Factors:Former Smoker and Diabetes.  Sonographer:    Lynwood Silvas RDCS Referring Phys: [phone] LAMAR JINNY FITCH   Sonographer Comments: Suboptimal apical window. Contrast needed but not used per patient request. IMPRESSIONS   1. Left ventricular ejection fraction, by estimation, is 60 to 65%. The left ventricle has normal function. The left ventricle has no regional wall motion abnormalities. There is mild concentric left ventricular hypertrophy. Left ventricular diastolic parameters are consistent with Grade I diastolic dysfunction (impaired relaxation). 2. Right ventricular systolic function is normal. The right ventricular size is normal. There is normal pulmonary artery systolic pressure. 3. The mitral valve is normal in structure. No evidence of mitral valve regurgitation. No evidence of mitral stenosis. 4. The aortic valve is tricuspid. Aortic valve regurgitation is not visualized. No aortic stenosis is present. 5. The inferior vena cava is normal in size with greater than 50% respiratory variability, suggesting right atrial pressure of 3 mmHg.  FINDINGS Left Ventricle: Left ventricular ejection fraction, by estimation, is 60 to 65%. The left ventricle has normal function. The left ventricle has no regional wall motion abnormalities. Global longitudinal strain performed but not reported based on interpreter judgement due to suboptimal tracking. The left ventricular internal cavity size was normal in size. There is mild concentric left ventricular hypertrophy. Left  ventricular diastolic parameters are consistent with Grade I diastolic dysfunction (impaired relaxation). Normal left ventricular filling pressure.  Right Ventricle: The right ventricular size is normal. No increase in right ventricular wall thickness. Right ventricular systolic function is normal. There is normal pulmonary artery systolic pressure. The tricuspid regurgitant velocity is 1.72 m/s, and with an assumed right atrial pressure of 3 mmHg, the estimated right ventricular systolic pressure is 14.8 mmHg.  Left Atrium: Left atrial size was normal in size.  Right Atrium: Right atrial size was normal in size.  Pericardium: There is no evidence of pericardial effusion.  Mitral Valve: The mitral valve is normal in structure. No evidence of mitral valve regurgitation. No evidence of mitral valve stenosis.  Tricuspid Valve: The tricuspid valve is normal in structure. Tricuspid valve regurgitation is trivial. No evidence of tricuspid stenosis.  Aortic Valve: The aortic valve is tricuspid. Aortic valve regurgitation is not visualized. No aortic stenosis is present.  Pulmonic Valve: The pulmonic valve was not well visualized. Pulmonic valve regurgitation is not visualized. No evidence of pulmonic stenosis.  Aorta: The aortic root and ascending aorta are structurally normal, with no evidence of dilitation and the aortic arch was  not well visualized.  Venous: A normal flow pattern is recorded from the right upper pulmonary vein. The inferior vena cava is normal in size with greater than 50% respiratory variability, suggesting right atrial pressure of 3 mmHg.  IAS/Shunts: No atrial level shunt detected by color flow Doppler.   LEFT VENTRICLE PLAX 2D LVIDd:         5.40 cm   Diastology LVIDs:         3.40 cm   LV e' medial:    7.83 cm/s LV PW:         1.20 cm   LV E/e' medial:  14.7 LV IVS:        1.20 cm   LV e' lateral:   7.29 cm/s LVOT diam:     1.90 cm   LV E/e' lateral: 15.8 LV SV:          53 LV SV Index:   28 LVOT Area:     2.84 cm   RIGHT VENTRICLE RV S prime:     18.10 cm/s TAPSE (M-mode): 2.9 cm  LEFT ATRIUM             Index        RIGHT ATRIUM           Index LA diam:        3.50 cm 1.81 cm/m   RA Area:     13.40 cm LA Vol (A2C):   60.0 ml 31.11 ml/m  RA Volume:   33.80 ml  17.53 ml/m LA Vol (A4C):   46.2 ml 23.95 ml/m LA Biplane Vol: 55.4 ml 28.72 ml/m AORTIC VALVE LVOT Vmax:   87.50 cm/s LVOT Vmean:  64.700 cm/s LVOT VTI:    0.188 m  AORTA Ao Root diam: 2.90 cm Ao Asc diam:  2.70 cm Ao Desc diam: 2.05 cm  MITRAL VALVE                TRICUSPID VALVE MV Area (PHT): 5.62 cm     TR Peak grad:   11.8 mmHg MV Decel Time: 135 msec     TR Vmax:        172.00 cm/s MV E velocity: 115.00 cm/s MV A velocity: 118.00 cm/s  SHUNTS MV E/A ratio:  0.97         Systemic VTI:  0.19 m Systemic Diam: 1.90 cm  Redell Leiter MD Electronically signed by Redell Leiter MD Signature Date/Time: 09/27/2021/1:29:45 PM    Final             Risk Assessment/Calculations:          Physical Exam:   VS:  BP (!) 163/89 (BP Location: Right Arm, Patient Position: Sitting, Cuff Size: Normal) Comment: has not taken her BP meds this am, appt at 0800  Pulse 82   Ht 5' 4 (1.626 m)   Wt 192 lb (87.1 kg)   LMP  (LMP Unknown)   SpO2 95%   BMI 32.96 kg/m    Wt Readings from Last 3 Encounters:  07/30/23 192 lb (87.1 kg)  06/13/23 197 lb (89.4 kg)  12/06/22 200 lb (90.7 kg)    GEN: Well nourished, well developed in no acute distress NECK: No JVD; No carotid bruits CARDIAC: RRR, no murmurs, rubs, gallops RESPIRATORY:  Clear to auscultation without rales, wheezing or rhonchi  ABDOMEN: Soft, non-tender, non-distended EXTREMITIES:  No edema; No deformity   ASSESSMENT AND PLAN: .   Ischemic cardiomyopathy/NSVT/presence of ICD-recent device check on 07/16/2023  revealed 1 episode of NSVT.  NYHA class I, euvolemic.  Continue carvedilol  12.5 mg twice daily, continue losartan   100 mg daily.  CAD- Stable with no anginal symptoms. No indication for ischemic evaluation.  Continue aspirin  81 mg daily, continue nitroglycerin  as needed discharge has not needed. Dyslipidemia with statin intolerance-cholesterol is formally managed by her PCP, would prefer her LDL be less than 70 however she is intolerant to statins.  She was previously started on Nexletol  at her last OV with Dr. Bernie however she states that her cholesterol was well-controlled and this was stopped. Hypertension-blood pressure is elevated in the office today at 163/89 however she has not taken her medications this morning, she states she checks it sporadically at home and it is typically well-controlled.  For now we will not make any changes however blood pressure continues to be elevated would likely need to stop her losartan  and transition her to valsartan. Tobacco abuse-she previously stopped however with the stress of caring for her mother she resumed smoking.  Currently smoking one half PPD, offered her pharmacological help with cessation however she politely declines and feels she can do it on her own as she has previously done in the past.       Dispo: Follow-up in 1 year with general cardiology.  Signed, Delon JAYSON Hoover, NP  "

## 2023-07-30 ENCOUNTER — Encounter: Payer: Self-pay | Admitting: Cardiology

## 2023-07-30 ENCOUNTER — Ambulatory Visit: Payer: BC Managed Care – PPO | Attending: Cardiology | Admitting: Cardiology

## 2023-07-30 VITALS — BP 163/89 | HR 82 | Ht 64.0 in | Wt 192.0 lb

## 2023-07-30 DIAGNOSIS — I25119 Atherosclerotic heart disease of native coronary artery with unspecified angina pectoris: Secondary | ICD-10-CM

## 2023-07-30 DIAGNOSIS — I2510 Atherosclerotic heart disease of native coronary artery without angina pectoris: Secondary | ICD-10-CM

## 2023-07-30 DIAGNOSIS — Z720 Tobacco use: Secondary | ICD-10-CM

## 2023-07-30 DIAGNOSIS — I472 Ventricular tachycardia, unspecified: Secondary | ICD-10-CM

## 2023-07-30 DIAGNOSIS — E785 Hyperlipidemia, unspecified: Secondary | ICD-10-CM

## 2023-07-30 DIAGNOSIS — I255 Ischemic cardiomyopathy: Secondary | ICD-10-CM

## 2023-07-30 DIAGNOSIS — Z95810 Presence of automatic (implantable) cardiac defibrillator: Secondary | ICD-10-CM

## 2023-07-30 DIAGNOSIS — I10 Essential (primary) hypertension: Secondary | ICD-10-CM

## 2023-07-30 NOTE — Patient Instructions (Signed)
Medication Instructions:  Your physician recommends that you continue on your current medications as directed. Please refer to the Current Medication list given to you today.  *If you need a refill on your cardiac medications before your next appointment, please call your pharmacy*   Lab Work: NONE If you have labs (blood work) drawn today and your tests are completely normal, you will receive your results only by: MyChart Message (if you have MyChart) OR A paper copy in the mail If you have any lab test that is abnormal or we need to change your treatment, we will call you to review the results.   Testing/Procedures: NONE   Follow-Up: At [facility], you and your health needs are our priority.  As part of our continuing mission to provide you with exceptional heart care, we have created designated Provider Care Teams.  These Care Teams include your primary Cardiologist (physician) and Advanced Practice Providers (APPs -  Physician Assistants and Nurse Practitioners) who all work together to provide you with the care you need, when you need it.  We recommend signing up for the patient portal called "MyChart".  Sign up information is provided on this After Visit Summary.  MyChart is used to connect with patients for Virtual Visits (Telemedicine).  Patients are able to view lab/test results, encounter notes, upcoming appointments, etc.  Non-urgent messages can be sent to your provider as well.   To learn more about what you can do with MyChart, go to [email or web address].    Your next appointment:   1 year(s)  Provider:   Neysa Hotter, MD  Other Instructions

## 2023-07-31 DIAGNOSIS — E113512 Type 2 diabetes mellitus with proliferative diabetic retinopathy with macular edema, left eye: Secondary | ICD-10-CM | POA: Diagnosis not present

## 2023-08-02 ENCOUNTER — Ambulatory Visit: Payer: BC Managed Care – PPO | Admitting: Physician Assistant

## 2023-08-28 ENCOUNTER — Telehealth: Payer: Self-pay

## 2023-08-28 ENCOUNTER — Other Ambulatory Visit: Payer: Self-pay | Admitting: Physician Assistant

## 2023-08-28 MED ORDER — SCOPOLAMINE 1 MG/3DAYS TD PT72: 1.0000 | MEDICATED_PATCH | TRANSDERMAL | 0 refills | Status: DC

## 2023-08-28 NOTE — Telephone Encounter (Signed)
Pt is requesting something to be called in for motion sickness before she goes on cruise. Advised her of 3 day time-frame but she said she needs to have it by tomorrow.

## 2023-08-28 NOTE — Telephone Encounter (Signed)
I can call something in but is she wanting the patch behind her ear or something she can take as needed for nausea

## 2023-09-03 ENCOUNTER — Telehealth: Payer: Self-pay

## 2023-09-03 NOTE — Telephone Encounter (Signed)
Needs PA for Nexletol.  Key [id number] Last name: Getz DOB: 1960/12/16

## 2023-09-04 ENCOUNTER — Telehealth: Payer: Self-pay | Admitting: Pharmacy Technician

## 2023-09-04 ENCOUNTER — Other Ambulatory Visit (HOSPITAL_COMMUNITY): Payer: Self-pay

## 2023-09-04 NOTE — Telephone Encounter (Signed)
Pharmacy Patient Advocate Encounter   Received notification from Pt Calls Messages that prior authorization for nexletol is required/requested.   Insurance verification completed.   The patient is insured through [facility] .   Per test claim: PA required; PA submitted to above mentioned insurance via CoverMyMeds Key/confirmation #/EOC [id number] Status is pending

## 2023-09-07 ENCOUNTER — Other Ambulatory Visit (HOSPITAL_COMMUNITY): Payer: Self-pay

## 2023-09-07 NOTE — Telephone Encounter (Signed)
Pharmacy Patient Advocate Encounter  Received notification from [facility] that Prior Authorization for nexletol has been APPROVED from 09/04/23 to unknown. Ran test claim, Copay is $25.00 one month. This test claim was processed through [facility]- copay amounts may vary at other pharmacies due to pharmacy/plan contracts, or as the patient moves through the different stages of their insurance plan.   PA #/Case ID/Reference #: [id number]

## 2023-09-14 ENCOUNTER — Other Ambulatory Visit: Payer: Self-pay | Admitting: Family Medicine

## 2023-09-25 DIAGNOSIS — E113512 Type 2 diabetes mellitus with proliferative diabetic retinopathy with macular edema, left eye: Secondary | ICD-10-CM | POA: Diagnosis not present

## 2023-09-26 ENCOUNTER — Other Ambulatory Visit: Payer: Self-pay | Admitting: Family Medicine

## 2023-09-26 DIAGNOSIS — E1121 Type 2 diabetes mellitus with diabetic nephropathy: Secondary | ICD-10-CM

## 2023-09-26 NOTE — Progress Notes (Unsigned)
Subjective:  Patient ID: Melinda Nguyen, female    DOB: 11-29-1960  Age: 62 y.o. MRN: [id number]  Chief Complaint  Patient presents with   Medical Management of Chronic Issues    HPI   Diabetes:  Complications: Glomerulopathy Glucose checking: Patient has not been checking it regularly k Glucose logs: 100-180 Most recent A1C: 8.3% Current medications: Takes Metformin 1000 mg 1 tablet in am and takes Semglee units before bed at 20 -24 U before bed.  Foot checks: daily Eye visit: Stephannie Li, MD - just saw Tuesday. Getting laser surgery in the future for diabetic retinopathy.  Not eating healthy all the time, but tries.   Hyperlipidemia: Current medications:Is not taking Nexletol 180 mg daily. Patient is intolerant to statins. Taking black seed oil and magnesium 400 mg daily.   Hypertensive Heart Disease with Chronic Systolic Congestive Heart Failure: Current medications: Patient is currently taking Carvedilol 12.5  mg 1 tablet twice daily, Losartan 50 mg take 1 tablet daily, baby aspirin 81 mg daily, see's Dr. Elberta Fortis once a year and Dr. Bing Matter every 6 months.  Depression, mild recurrent: Patient is currently taking Cymbalta 30 mg 1 tablet daily.   GERD: Omeprazole 20 mg daily as needed.      09/27/2023    8:31 AM 06/13/2023    8:01 AM 12/06/2022    8:38 AM 12/06/2022    8:37 AM 08/16/2022    8:48 AM  Depression screen PHQ 2/9  Decreased Interest 0 0 0 0 0  Down, Depressed, Hopeless 0 1 0 0 1  PHQ - 2 Score 0 1 0 0 1  Altered sleeping 1 1 0  0  Tired, decreased energy 0 1 0  0  Change in appetite 1 0 0  2  Feeling bad or failure about yourself  0 0 0  0  Trouble concentrating 0 0 0  0  Moving slowly or fidgety/restless 0 0 0  2  Suicidal thoughts 0 0 0  0  PHQ-9 Score 2 3 0  5  Difficult doing work/chores Not difficult at all Somewhat difficult Not difficult at all  Somewhat difficult        06/13/2023    8:00 AM  Fall Risk   Falls in the past year? 1  Number falls  in past yr: 0  Injury with Fall? 0  Risk for fall due to : History of fall(s)  Follow up Falls evaluation completed;Falls prevention discussed    Patient Care Team: Blane Ohara, MD as PCP - General (Family Medicine) Regan Lemming, MD as PCP - Electrophysiology (Cardiology) Georgeanna Lea, MD as PCP - Cardiology (Cardiology) [facility]   Review of Systems  Constitutional:  Negative for chills, fatigue and fever.  HENT:  Negative for congestion, ear pain, rhinorrhea and sore throat.   Respiratory:  Negative for cough and shortness of breath.   Cardiovascular:  Negative for chest pain.  Gastrointestinal:  Negative for abdominal pain, constipation, diarrhea, nausea and vomiting.  Genitourinary:  Negative for dysuria and urgency.  Musculoskeletal:  Negative for back pain and myalgias.  Neurological:  Negative for dizziness, weakness, light-headedness and headaches.  Psychiatric/Behavioral:  Negative for dysphoric mood. The patient is not nervous/anxious.     Current Outpatient Medications on File Prior to Visit  Medication Sig Dispense Refill   aspirin EC 81 MG tablet Take 1 tablet (81 mg total) by mouth daily. 90 tablet 3   carvedilol (COREG) 12.5 MG tablet Take 1  tablet (12.5 mg total) by mouth 2 (two) times daily. 60 tablet 11   DULoxetine (CYMBALTA) 30 MG capsule TAKE 1 CAPSULE(30 MG) BY MOUTH DAILY 90 capsule 3   LORazepam (ATIVAN) 0.5 MG tablet Take 1 tablet (0.5 mg total) by mouth daily as needed. 30 tablet 3   losartan (COZAAR) 100 MG tablet TAKE 1 TABLET(100 MG) BY MOUTH DAILY 90 tablet 0   MAGNESIUM PO Take 1 tablet by mouth daily.     metFORMIN (GLUCOPHAGE) 1000 MG tablet TAKE 1 TABLET(1000 MG) BY MOUTH TWICE DAILY WITH A MEAL 180 tablet 0   nitroGLYCERIN (NITROSTAT) 0.4 MG SL tablet Place 1 tablet (0.4 mg total) under the tongue every 5 (five) minutes as needed. 25 tablet 12   omeprazole (PRILOSEC) 20 MG capsule Take 20 mg by mouth daily as  needed (acid reflux).     SEMGLEE, YFGN, 100 UNIT/ML Pen ADMINISTER 34 UNITS UNDER THE SKIN DAILY 30 mL 1   No current facility-administered medications on file prior to visit.   Past Medical History:  Diagnosis Date   AICD (automatic cardioverter/defibrillator) present 03/13/2019   CAD (coronary artery disease)    CHF (congestive heart failure) (HCC)    Depression    Diabetes mellitus without complication (HCC)    GERD (gastroesophageal reflux disease)    Hypertension    Mixed hyperlipidemia    Myocardial infarction (HCC) 2020   Other hypotension    Smoking history 03/10/2019   Smokes 1/2 pack a day   Ventricular tachycardia [facility])    Past Surgical History:  Procedure Laterality Date   ABDOMINAL HYSTERECTOMY     CATARACT EXTRACTION Bilateral    2016-2017   EYE SURGERY Right 10/2021   "gel leaking"/laser surgery   ICD IMPLANT N/A 03/13/2019   Procedure: ICD IMPLANT;  Surgeon: Regan Lemming, MD;  Location: [facility];  Service: Cardiovascular;  Laterality: N/A;   ICD IMPLANT  03/13/2019   LEFT HEART CATH AND CORONARY ANGIOGRAPHY N/A 03/12/2019   Procedure: LEFT HEART CATH AND CORONARY ANGIOGRAPHY;  Surgeon: Iran Ouch, MD;  Location: [facility];  Service: Cardiovascular;  Laterality: N/A;    Family History  Problem Relation Age of Onset   Hypertension Mother    Diabetes Mother    COPD Father    Social History   Socioeconomic History   Marital status: Widowed    Spouse name: Not on file   Number of children: Not on file   Years of education: Not on file   Highest education level: Not on file  Occupational History   Not on file  Tobacco Use   Smoking status: Every Day    Current packs/day: 0.50    Average packs/day: 0.5 packs/day for 1.7 years (0.9 ttl pk-yrs)    Types: Cigarettes    Start date: 01/2022    Last attempt to quit: 03/2019   Smokeless tobacco: Never  Vaping Use   Vaping status: Never Used  Substance and Sexual Activity    Alcohol use: Not Currently   Drug use: Never   Sexual activity: Not on file  Other Topics Concern   Not on file  Social History Narrative   Not on file   Social Drivers of Health   Financial Resource Strain: Low Risk  (12/06/2022)   Overall Financial Resource Strain (CARDIA)    Difficulty of Paying Living Expenses: Not hard at all  Food Insecurity: No Food Insecurity (12/06/2022)   Hunger Vital Sign  Worried About Programme researcher, broadcasting/film/video in the Last Year: Never true    Ran Out of Food in the Last Year: Never true  Transportation Needs: No Transportation Needs (12/06/2022)   PRAPARE - Administrator, Civil Service (Medical): No    Lack of Transportation (Non-Medical): No  Physical Activity: Sufficiently Active (12/06/2022)   Exercise Vital Sign    Days of Exercise per Week: 3 days    Minutes of Exercise per Session: 60 min  Stress: No Stress Concern Present (12/06/2022)   [employer] of Occupational Health - Occupational Stress Questionnaire    Feeling of Stress : Not at all  Social Connections: Moderately Isolated (12/06/2022)   Social Connection and Isolation Panel [NHANES]    Frequency of Communication with Friends and Family: Three times a week    Frequency of Social Gatherings with Friends and Family: Three times a week    Attends Religious Services: More than 4 times per year    Active Member of Clubs or Organizations: No    Attends Banker Meetings: Never    Marital Status: Widowed    Objective:  BP 112/62   Pulse 99   Temp (!) 87 F (30.6 C)   Ht 5\' 4"  (1.626 m)   Wt 195 lb (88.5 kg)   LMP  (LMP Unknown)   SpO2 96%   BMI 33.47 kg/m      09/27/2023    8:08 AM 07/30/2023    7:52 AM 06/13/2023    7:57 AM  BP/Weight  Systolic BP 112 163 128  Diastolic BP 62 89 62  Wt. (Lbs) 195 192 197  BMI 33.47 kg/m2 32.96 kg/m2 33.81 kg/m2    Physical Exam Vitals reviewed.  Constitutional:      Appearance: Normal appearance. She is obese.   Neck:     Vascular: No carotid bruit.  Cardiovascular:     Rate and Rhythm: Normal rate and regular rhythm.     Heart sounds: Normal heart sounds.  Pulmonary:     Effort: Pulmonary effort is normal. No respiratory distress.     Breath sounds: Normal breath sounds.  Abdominal:     General: Abdomen is flat. Bowel sounds are normal.     Palpations: Abdomen is soft.     Tenderness: There is no abdominal tenderness.  Neurological:     Mental Status: She is alert and oriented to person, place, and time.  Psychiatric:        Mood and Affect: Mood normal.        Behavior: Behavior normal.     Diabetic Foot Exam - Simple   No data filed      Lab Results  Component Value Date   WBC 9.1 06/13/2023   HGB 14.2 06/13/2023   HCT 43.1 06/13/2023   PLT 280 06/13/2023   GLUCOSE 81 06/13/2023   CHOL 162 06/13/2023   TRIG 116 06/13/2023   HDL 52 06/13/2023   LDLCALC 89 06/13/2023   ALT 10 06/13/2023   AST 12 06/13/2023   NA 140 06/13/2023   K 4.7 06/13/2023   CL 101 06/13/2023   CREATININE 0.63 06/13/2023   BUN 9 06/13/2023   CO2 26 06/13/2023   TSH 2.720 12/06/2022   HGBA1C 8.3 (H) 06/13/2023   MICROALBUR 80 06/23/2021      Assessment & Plan:    Hypertensive heart disease with chronic systolic congestive heart failure (HCC) Assessment & Plan: Well controlled.  No changes to medicines. Losartan  50 mg daily, Carvedilol 12.5 mg twice a day. Continue to work on eating a healthy diet and exercise.  Labs drawn today.    Orders: -     CBC with Differential/Platelet -     Comprehensive metabolic panel  Diabetic glomerulopathy [facility]) Assessment & Plan: Control: poor Free style libre 3 applied. Orders sent to dme Patient has highs and lows.  Recommend check feet daily. Recommend annual eye exams. Medicines: Metformin 1000 mg before bed daily, Semglee 22 units before bed.  Continue to work on eating a healthy diet and exercise.  Labs ordered.   Orders: -     Hemoglobin  A1c -     Microalbumin / creatinine urine ratio  Mixed hyperlipidemia Assessment & Plan: Recommend continue to work on eating healthy diet and exercise. Discuss medications after labs back.  labs draw today.  Orders: -     Lipid panel  Gastroesophageal reflux disease with esophagitis, unspecified whether hemorrhage Assessment & Plan: The current medical regimen is effective;  continue present plan and medications.  Continue omeprazole 20 mg daily as needed.    Absence of both cervix and uterus, acquired Assessment & Plan: No paps needed   ICD (implantable cardioverter-defibrillator) in place Assessment & Plan: Management per specialist.     Other orders -     Scopolamine; Place 1 patch (1.5 mg total) onto the skin every 3 (three) days.  Dispense: 4 patch; Refill: 0     Meds ordered this encounter  Medications   scopolamine (TRANSDERM-SCOP) 1 MG/3DAYS    Sig: Place 1 patch (1.5 mg total) onto the skin every 3 (three) days.    Dispense:  4 patch    Refill:  0    Orders Placed This Encounter  Procedures   CBC with Differential/Platelet   Comprehensive metabolic panel   Hemoglobin A1c   Lipid panel   Microalbumin / creatinine urine ratio     Follow-up: Return in about 3 months (around 12/26/2023) for chronic follow up.   I,Marla I Leal-Borjas,acting as a scribe for Blane Ohara, MD.,have documented all relevant documentation on the behalf of Blane Ohara, MD,as directed by  Blane Ohara, MD while in the presence of Blane Ohara, MD.   An After Visit Summary was printed and given to the patient.  Blane Ohara, MD [facility] [phone]

## 2023-09-27 ENCOUNTER — Encounter: Payer: Self-pay | Admitting: Family Medicine

## 2023-09-27 ENCOUNTER — Ambulatory Visit: Payer: BC Managed Care – PPO | Admitting: Family Medicine

## 2023-09-27 ENCOUNTER — Telehealth: Payer: Self-pay

## 2023-09-27 VITALS — BP 112/62 | HR 99 | Temp 87.0°F | Ht 64.0 in | Wt 195.0 lb

## 2023-09-27 DIAGNOSIS — K2100 Gastro-esophageal reflux disease with esophagitis, without bleeding: Secondary | ICD-10-CM | POA: Diagnosis not present

## 2023-09-27 DIAGNOSIS — I110 Hypertensive heart disease with heart failure: Secondary | ICD-10-CM

## 2023-09-27 DIAGNOSIS — E1121 Type 2 diabetes mellitus with diabetic nephropathy: Secondary | ICD-10-CM

## 2023-09-27 DIAGNOSIS — F330 Major depressive disorder, recurrent, mild: Secondary | ICD-10-CM

## 2023-09-27 DIAGNOSIS — Z95810 Presence of automatic (implantable) cardiac defibrillator: Secondary | ICD-10-CM

## 2023-09-27 DIAGNOSIS — I5022 Chronic systolic (congestive) heart failure: Secondary | ICD-10-CM

## 2023-09-27 DIAGNOSIS — E782 Mixed hyperlipidemia: Secondary | ICD-10-CM

## 2023-09-27 DIAGNOSIS — Z90710 Acquired absence of both cervix and uterus: Secondary | ICD-10-CM

## 2023-09-27 LAB — CBC WITH DIFFERENTIAL/PLATELET
Basophils Absolute: 0.1 10*3/uL (ref 0.0–0.2)
Basos: 1 %
EOS (ABSOLUTE): 0.2 10*3/uL (ref 0.0–0.4)
Eos: 3 %
Hematocrit: 43.2 % (ref 34.0–46.6)
Hemoglobin: 13.9 g/dL (ref 11.1–15.9)
Immature Grans (Abs): 0 10*3/uL (ref 0.0–0.1)
Immature Granulocytes: 0 %
Lymphocytes Absolute: 2.5 10*3/uL (ref 0.7–3.1)
Lymphs: 30 %
MCH: 27.9 pg (ref 26.6–33.0)
MCHC: 32.2 g/dL (ref 31.5–35.7)
MCV: 87 fL (ref 79–97)
Monocytes Absolute: 0.6 10*3/uL (ref 0.1–0.9)
Monocytes: 7 %
Neutrophils Absolute: 4.8 10*3/uL (ref 1.4–7.0)
Neutrophils: 59 %
Platelets: 258 10*3/uL (ref 150–450)
RBC: 4.98 x10E6/uL (ref 3.77–5.28)
RDW: 13.3 % (ref 11.7–15.4)
WBC: 8.2 10*3/uL (ref 3.4–10.8)

## 2023-09-27 LAB — LIPID PANEL
Chol/HDL Ratio: 3.3 {ratio} (ref 0.0–4.4)
Cholesterol, Total: 189 mg/dL (ref 100–199)
HDL: 57 mg/dL (ref >39)
LDL Chol Calc (NIH): 106 mg/dL — ABNORMAL HIGH (ref 0–99)
Triglycerides: 146 mg/dL (ref 0–149)
VLDL Cholesterol Cal: 26 mg/dL (ref 5–40)

## 2023-09-27 LAB — COMPREHENSIVE METABOLIC PANEL
ALT: 10 [IU]/L (ref 0–32)
AST: 12 [IU]/L (ref 0–40)
Albumin: 4.1 g/dL (ref 3.9–4.9)
Alkaline Phosphatase: 114 [IU]/L (ref 44–121)
BUN/Creatinine Ratio: 13 (ref 12–28)
BUN: 8 mg/dL (ref 8–27)
Bilirubin Total: 0.3 mg/dL (ref 0.0–1.2)
CO2: 25 mmol/L (ref 20–29)
Calcium: 8.9 mg/dL (ref 8.7–10.3)
Chloride: 102 mmol/L (ref 96–106)
Creatinine, Ser: 0.64 mg/dL (ref 0.57–1.00)
Globulin, Total: 2.9 g/dL (ref 1.5–4.5)
Glucose: 97 mg/dL (ref 70–99)
Potassium: 4.7 mmol/L (ref 3.5–5.2)
Sodium: 141 mmol/L (ref 134–144)
Total Protein: 7 g/dL (ref 6.0–8.5)
eGFR: 99 mL/min/{1.73_m2} (ref >59)

## 2023-09-27 LAB — HEMOGLOBIN A1C
Est. average glucose Bld gHb Est-mCnc: 192 mg/dL
Hgb A1c MFr Bld: 8.3 % — ABNORMAL HIGH (ref 4.8–5.6)

## 2023-09-27 MED ORDER — SCOPOLAMINE 1 MG/3DAYS TD PT72: 1.0000 | MEDICATED_PATCH | TRANSDERMAL | 0 refills | Status: DC

## 2023-09-27 NOTE — Assessment & Plan Note (Signed)
Recommend continue to work on eating healthy diet and exercise. Discuss medications after labs back.  labs draw today.

## 2023-09-27 NOTE — Telephone Encounter (Signed)
Order for freestyle libre 3 plus submitted via parachute to [facility] per Dr. Sedalia Muta request. Patient aware they will contact her via phone prior to sending out.

## 2023-09-27 NOTE — Assessment & Plan Note (Signed)
 No paps needed.

## 2023-09-27 NOTE — Assessment & Plan Note (Signed)
Control: poor Free style libre 3 applied. Orders sent to dme Patient has highs and lows.  Recommend check feet daily. Recommend annual eye exams. Medicines: Metformin 1000 mg before bed daily, Semglee 22 units before bed.  Continue to work on eating a healthy diet and exercise.  Labs ordered.

## 2023-09-27 NOTE — Assessment & Plan Note (Signed)
The current medical regimen is effective;  continue present plan and medications.  Continue omeprazole 20 mg daily as needed.

## 2023-09-27 NOTE — Assessment & Plan Note (Signed)
Well controlled.  No changes to medicines. Losartan 50 mg daily, Carvedilol 12.5 mg twice a day. Continue to work on eating a healthy diet and exercise.  Labs drawn today.

## 2023-09-27 NOTE — Assessment & Plan Note (Signed)
Management per specialist. 

## 2023-09-27 NOTE — Addendum Note (Signed)
Addended by: Arman Bogus A on: 09/27/2023 11:29 AM   Modules accepted: Orders

## 2023-09-28 ENCOUNTER — Encounter: Payer: Self-pay | Admitting: Family Medicine

## 2023-10-01 ENCOUNTER — Other Ambulatory Visit: Payer: Self-pay

## 2023-10-01 DIAGNOSIS — E1165 Type 2 diabetes mellitus with hyperglycemia: Secondary | ICD-10-CM

## 2023-10-01 DIAGNOSIS — I110 Hypertensive heart disease with heart failure: Secondary | ICD-10-CM

## 2023-10-01 MED ORDER — FREESTYLE LIBRE 3 SENSOR MISC: 11 refills | Status: DC

## 2023-10-12 ENCOUNTER — Other Ambulatory Visit: Payer: Self-pay | Admitting: Family Medicine

## 2023-10-12 MED ORDER — METFORMIN HCL 1000 MG PO TABS: 1000.0000 mg | ORAL_TABLET | Freq: Two times a day (BID) | ORAL | 0 refills | Status: DC

## 2023-10-12 NOTE — Telephone Encounter (Signed)
 Copied from CRM [id number]. Topic: Clinical - Medication Refill >> Oct 12, 2023 10:15 AM Delon DASEN wrote: Most Recent Primary Care Visit:  Provider: [facility]  Department[facility]  Visit Type: OFFICE VISIT  Date: 09/27/2023  Medication: ***  Has the patient contacted their pharmacy?  (Agent: If no, request that the patient contact the pharmacy for the refill. If patient does not wish to contact the pharmacy document the reason why and proceed with request.) (Agent: If yes, when and what did the pharmacy advise?)  Is this the correct pharmacy for this prescription?  If no, delete pharmacy and type the correct one.  This is the patient's preferred pharmacy:  [facility] [phone] - [place], [place] - [phone] AT [facility] [address] & [address] [place] [address] Phone: [phone] Fax: [phone]   Has the prescription been filled recently?   Is the patient out of the medication?   Has the patient been seen for an appointment in the last year OR does the patient have an upcoming appointment?   Can we respond through MyChart?   Agent: Please be advised that Rx refills may take up to 3 business days. We ask that you follow-up with your pharmacy.

## 2023-10-18 ENCOUNTER — Ambulatory Visit (INDEPENDENT_AMBULATORY_CARE_PROVIDER_SITE_OTHER): Payer: BC Managed Care – PPO

## 2023-10-18 DIAGNOSIS — I255 Ischemic cardiomyopathy: Secondary | ICD-10-CM

## 2023-10-18 LAB — CUP PACEART REMOTE DEVICE CHECK
Battery Remaining Longevity: 62 mo
Battery Remaining Percentage: 61 %
Battery Voltage: 2.96 V
Brady Statistic RV Percent Paced: 1 %
Date Time Interrogation Session: 20250109002836
HighPow Impedance: 87 Ohm
HighPow Impedance: 87 Ohm
Implantable Lead Connection Status: 753985
Implantable Lead Implant Date: 20200604
Implantable Lead Location: 753860
Implantable Pulse Generator Implant Date: 20200604
Lead Channel Impedance Value: 540 Ohm
Lead Channel Pacing Threshold Amplitude: 0.5 V
Lead Channel Pacing Threshold Pulse Width: 0.5 ms
Lead Channel Sensing Intrinsic Amplitude: 11.3 mV
Lead Channel Setting Pacing Amplitude: 2.5 V
Lead Channel Setting Pacing Pulse Width: 0.5 ms
Lead Channel Setting Sensing Sensitivity: 0.5 mV
Pulse Gen Serial Number: 9860889
Zone Setting Status: 755011

## 2023-10-30 ENCOUNTER — Other Ambulatory Visit: Payer: Self-pay | Admitting: Cardiology

## 2023-11-01 ENCOUNTER — Other Ambulatory Visit: Payer: Self-pay | Admitting: Cardiology

## 2023-11-21 ENCOUNTER — Other Ambulatory Visit: Payer: Self-pay

## 2023-11-21 ENCOUNTER — Telehealth: Payer: Self-pay

## 2023-11-21 DIAGNOSIS — E1121 Type 2 diabetes mellitus with diabetic nephropathy: Secondary | ICD-10-CM

## 2023-11-21 MED ORDER — INSULIN PEN NEEDLE 31G X 6 MM MISC: 3 refills | Status: AC

## 2023-11-21 NOTE — Telephone Encounter (Signed)
Copied from CRM [id number]. Topic: Clinical - Medication Question >> Nov 21, 2023  2:21 PM Melinda Nguyen wrote: Reason for CRM: Patient ran out of her insulin needs and she currently using her late husband needles. Patient wants to know if we can switch the needle size to 0.230.25x6 mL.Patient can be contacted through [place] or by phone [phone]. Listed below is the patient pharmacy   [facility] [phone] - [place], [place] - [address] AT [facility] [address] [facility] [address] [place] [address] Phone: [phone]  Fax: [phone]

## 2023-11-21 NOTE — Telephone Encounter (Signed)
Called patient pen needles sent in as requested

## 2023-11-27 NOTE — Progress Notes (Signed)
 Remote ICD transmission.

## 2023-12-19 ENCOUNTER — Telehealth: Payer: Self-pay

## 2023-12-19 NOTE — Telephone Encounter (Signed)
 Patient was identified as falling into the [place] Measure - Diabetes.   Patient was: Appointment scheduled for lab or office visit for A1c.   Last office visit 09/26/24 and follow-up scheduled for 12/27/23

## 2023-12-20 ENCOUNTER — Other Ambulatory Visit: Payer: Self-pay | Admitting: Family Medicine

## 2023-12-25 DIAGNOSIS — H31093 Other chorioretinal scars, bilateral: Secondary | ICD-10-CM | POA: Diagnosis not present

## 2023-12-25 DIAGNOSIS — E113513 Type 2 diabetes mellitus with proliferative diabetic retinopathy with macular edema, bilateral: Secondary | ICD-10-CM | POA: Diagnosis not present

## 2023-12-25 DIAGNOSIS — H35372 Puckering of macula, left eye: Secondary | ICD-10-CM | POA: Diagnosis not present

## 2023-12-25 DIAGNOSIS — H43822 Vitreomacular adhesion, left eye: Secondary | ICD-10-CM | POA: Diagnosis not present

## 2023-12-26 NOTE — Progress Notes (Unsigned)
 Subjective:  Patient ID: Melinda Nguyen, female    DOB: 08/22/1961  Age: 63 y.o. MRN: [id number]  Chief Complaint  Patient presents with   Medical Management of Chronic Issues   Discussed the use of AI scribe software for clinical note transcription with the patient, who gave verbal consent to proceed.  History of Present Illness  Melinda Nguyen is a 63 year old female with diabetes who presents with concerns about glucose monitoring and sinus symptoms.  She is experiencing issues with her Freestyle Libre glucose monitoring device, which is not recording all her glucose levels accurately. The device does not show any readings for seven days and only shows three readings when it should show more, despite having at least ten episodes where the device beeped, indicating high glucose levels. Her glucose levels tend to rise around supper time.  She is currently on metformin and Semglee insulin, which she takes at 8 PM. She is taking 34 units of Semglee, which sometimes causes her sugar levels to drop significantly. She reports eating healthier than before, although she acknowledges not eating healthy all the time. She is not taking any cholesterol medications but uses black seed oil. She takes magnesium occasionally and is on carvedilol 12.5 mg twice a day, losartan 100 mg once a day, and a baby aspirin daily. She uses omeprazole as needed for acid reflux and lorazepam occasionally for anxiety, most recently before an eye doctor appointment.  She reports sinus symptoms, including pain in the sinus area, drainage, and discomfort in her jaws and teeth, which have been present for about a week. She attributes these symptoms to spending a lot of time outside. She uses an unspecified allergy medication that helps with her symptoms, and she has tried antihistamines like Zyrtec and Benadryl, which provide relief. She experiences sweats and hot flashes. No earache, sore throat, or stuffy nose.  She has a history  of smoking but reports that she has reduced her smoking significantly, although she still smokes occasionally.      Diabetes:  Complications: Glomerulopathy Glucose checking: CGM Glucose logs: 100-180 Most recent A1C: 8.3% Current medications: Takes Metformin 1000 mg 1 tablet in am and takes Semglee units before bed at 20 -24 U before bed.  Foot checks: daily Eye visit: Stephannie Li, MD - just saw Tuesday. Getting laser surgery in the future for diabetic retinopathy.  Eating healthier.   Hyperlipidemia: Current medications: Patient is intolerant to statins and not taking nexlitol. Taking black seed oil and magnesium 400 mg daily.   Hypertensive Heart Disease with Chronic Systolic Congestive Heart Failure: Current medications: Patient is currently taking Carvedilol 12.5  mg 1 tablet twice daily, Losartan 100 mg take 1 tablet daily, baby aspirin 81 mg daily, see's Dr. Elberta Fortis once a year and Dr. Bing Matter every 6 months.  Depression, mild recurrent: Patient is currently taking Cymbalta 30 mg 1 tablet daily.  Lorazepam very sparingly.   GERD: Omeprazole 20 mg daily as needed.           12/27/2023    8:44 AM 09/27/2023    8:31 AM 06/13/2023    8:01 AM 12/06/2022    8:38 AM 12/06/2022    8:37 AM  Depression screen PHQ 2/9  Decreased Interest 0 0 0 0 0  Down, Depressed, Hopeless 1 0 1 0 0  PHQ - 2 Score 1 0 1 0 0  Altered sleeping 1 1 1  0   Tired, decreased energy 0 0 1 0   Change in  appetite 0 1 0 0   Feeling bad or failure about yourself  0 0 0 0   Trouble concentrating 0 0 0 0   Moving slowly or fidgety/restless 0 0 0 0   Suicidal thoughts 0 0 0 0   PHQ-9 Score 2 2 3  0   Difficult doing work/chores Not difficult at all Not difficult at all Somewhat difficult Not difficult at all         12/27/2023    8:43 AM  Fall Risk   Falls in the past year? 1  Number falls in past yr: 0  Injury with Fall? 0  Risk for fall due to : No Fall Risks  Follow up Falls evaluation  completed    Patient Care Team: Blane Ohara, MD as PCP - General (Family Medicine) Regan Lemming, MD as PCP - Electrophysiology (Cardiology) Georgeanna Lea, MD as PCP - Cardiology (Cardiology) [facility]   Review of Systems  Constitutional:  Negative for appetite change, fatigue and fever.  HENT:  Negative for congestion, ear pain, sinus pressure and sore throat.   Respiratory:  Negative for cough, chest tightness, shortness of breath and wheezing.   Cardiovascular:  Negative for chest pain and palpitations.  Gastrointestinal:  Negative for abdominal pain, constipation, diarrhea, nausea and vomiting.  Genitourinary:  Negative for dysuria.  Musculoskeletal:  Negative for arthralgias, back pain, joint swelling and myalgias.  Skin:  Negative for rash.  Neurological:  Negative for dizziness, weakness and headaches.  Psychiatric/Behavioral:  Negative for dysphoric mood. The patient is not nervous/anxious.     Current Outpatient Medications on File Prior to Visit  Medication Sig Dispense Refill   aspirin EC 81 MG tablet Take 1 tablet (81 mg total) by mouth daily. 90 tablet 3   carvedilol (COREG) 12.5 MG tablet TAKE 1 TABLET(12.5 MG) BY MOUTH TWICE DAILY 60 tablet 0   Continuous Glucose Sensor (FREESTYLE LIBRE 3 SENSOR) MISC Continuous Glucose sensor 2 each 11   DULoxetine (CYMBALTA) 30 MG capsule TAKE 1 CAPSULE(30 MG) BY MOUTH DAILY 90 capsule 3   Insulin Pen Needle 31G X 6 MM MISC Inject insulin as directed 100 each 3   LORazepam (ATIVAN) 0.5 MG tablet Take 1 tablet (0.5 mg total) by mouth daily as needed. 30 tablet 3   losartan (COZAAR) 100 MG tablet TAKE 1 TABLET(100 MG) BY MOUTH DAILY 90 tablet 0   MAGNESIUM PO Take 1 tablet by mouth daily.     metFORMIN (GLUCOPHAGE) 1000 MG tablet Take 1 tablet (1,000 mg total) by mouth 2 (two) times daily with a meal. 180 tablet 0   nitroGLYCERIN (NITROSTAT) 0.4 MG SL tablet Place 1 tablet (0.4 mg total) under the  tongue every 5 (five) minutes as needed. 25 tablet 12   omeprazole (PRILOSEC) 20 MG capsule Take 20 mg by mouth daily as needed (acid reflux).     SEMGLEE, YFGN, 100 UNIT/ML Pen ADMINISTER 34 UNITS UNDER THE SKIN DAILY 30 mL 1   No current facility-administered medications on file prior to visit.   Past Medical History:  Diagnosis Date   AICD (automatic cardioverter/defibrillator) present 03/13/2019   CAD (coronary artery disease)    CHF (congestive heart failure) (HCC)    Depression    Diabetes mellitus without complication (HCC)    GERD (gastroesophageal reflux disease)    Hypertension    Mixed hyperlipidemia    Myocardial infarction (HCC) 2020   Other hypotension    Smoking history 03/10/2019  Smokes 1/2 pack a day   Ventricular tachycardia [facility])    Past Surgical History:  Procedure Laterality Date   ABDOMINAL HYSTERECTOMY     CATARACT EXTRACTION Bilateral    2016-2017   EYE SURGERY Right 10/2021   "gel leaking"/laser surgery   ICD IMPLANT N/A 03/13/2019   Procedure: ICD IMPLANT;  Surgeon: Regan Lemming, MD;  Location: [facility] INVASIVE CV LAB;  Service: Cardiovascular;  Laterality: N/A;   ICD IMPLANT  03/13/2019   LEFT HEART CATH AND CORONARY ANGIOGRAPHY N/A 03/12/2019   Procedure: LEFT HEART CATH AND CORONARY ANGIOGRAPHY;  Surgeon: Iran Ouch, MD;  Location: [facility] INVASIVE CV LAB;  Service: Cardiovascular;  Laterality: N/A;    Family History  Problem Relation Age of Onset   Hypertension Mother    Diabetes Mother    COPD Father    Social History   Socioeconomic History   Marital status: Widowed    Spouse name: Not on file   Number of children: Not on file   Years of education: Not on file   Highest education level: Not on file  Occupational History   Not on file  Tobacco Use   Smoking status: Every Day    Current packs/day: 0.50    Average packs/day: 0.5 packs/day for 2.0 years (1.0 ttl pk-yrs)    Types: Cigarettes    Start date: 01/2022    Last  attempt to quit: 03/2019   Smokeless tobacco: Never  Vaping Use   Vaping status: Never Used  Substance and Sexual Activity   Alcohol use: Not Currently   Drug use: Never   Sexual activity: Not on file  Other Topics Concern   Not on file  Social History Narrative   Not on file   Social Drivers of Health   Financial Resource Strain: Low Risk  (12/06/2022)   Overall Financial Resource Strain (CARDIA)    Difficulty of Paying Living Expenses: Not hard at all  Food Insecurity: No Food Insecurity (12/06/2022)   Hunger Vital Sign    Worried About Running Out of Food in the Last Year: Never true    Ran Out of Food in the Last Year: Never true  Transportation Needs: No Transportation Needs (12/06/2022)   PRAPARE - Administrator, Civil Service (Medical): No    Lack of Transportation (Non-Medical): No  Physical Activity: Sufficiently Active (12/06/2022)   Exercise Vital Sign    Days of Exercise per Week: 3 days    Minutes of Exercise per Session: 60 min  Stress: No Stress Concern Present (12/06/2022)   [employer] of Occupational Health - Occupational Stress Questionnaire    Feeling of Stress : Not at all  Social Connections: Moderately Isolated (12/06/2022)   Social Connection and Isolation Panel [NHANES]    Frequency of Communication with Friends and Family: Three times a week    Frequency of Social Gatherings with Friends and Family: Three times a week    Attends Religious Services: More than 4 times per year    Active Member of Clubs or Organizations: No    Attends Banker Meetings: Never    Marital Status: Widowed    Objective:  BP 138/68 (BP Location: Left Arm, Patient Position: Sitting)   Pulse 72   Temp 98 F (36.7 C) (Temporal)   Ht 5\' 4"  (1.626 m)   Wt 190 lb (86.2 kg)   LMP  (LMP Unknown)   SpO2 95%   BMI 32.61 kg/m  12/27/2023    8:45 AM 09/27/2023    8:08 AM 07/30/2023    7:52 AM  BP/Weight  Systolic BP 138 112 163  Diastolic  BP 68 62 89  Wt. (Lbs) 190 195 192  BMI 32.61 kg/m2 33.47 kg/m2 32.96 kg/m2    Physical Exam  Diabetic Foot Exam - Simple   No data filed      Lab Results  Component Value Date   WBC 7.0 12/27/2023   HGB 14.8 12/27/2023   HCT 46.2 12/27/2023   PLT 260 12/27/2023   GLUCOSE 87 12/27/2023   CHOL 163 12/27/2023   TRIG 107 12/27/2023   HDL 52 12/27/2023   LDLCALC 92 12/27/2023   ALT 9 12/27/2023   AST 14 12/27/2023   NA 139 12/27/2023   K 4.5 12/27/2023   CL 102 12/27/2023   CREATININE 0.59 12/27/2023   BUN 11 12/27/2023   CO2 22 12/27/2023   TSH 2.720 12/06/2022   HGBA1C 7.4 (H) 12/27/2023   MICROALBUR 80 06/23/2021      Assessment & Plan:  Assessment and Plan   Type 2 Diabetes Mellitus Experiences elevated blood glucose levels, particularly around supper time, despite being on metformin and Semglee (insulin glargine). The [facility] device is not consistently recording glucose levels, complicating monitoring. A1c has reportedly dropped to 6.7%, indicating some level of control. Considering adding a short-acting insulin before meals to address postprandial spikes but wants to confirm the A1c level before making changes. Discussed moving Semglee administration to before supper to potentially improve control, acknowledging that insulin may decrease in effectiveness towards the end of the 24-hour period. - Check A1c level to confirm current control. - Consider adding short-acting insulin before meals if A1c is not at target. - Advise moving Semglee administration to before supper.  Sinusitis Reports sinus infection symptoms, including facial pain and sinus drainage, likely exacerbated by outdoor exposure. Symptoms include pain in the jaw and teeth, and sinus drainage causing cough. Antihistamines like Zyrtec or Benadryl help alleviate symptoms. - Prescribe Z-Pak (azithromycin) for sinus infection. - Recommend antihistamines like Zyrtec or Benadryl for allergy  relief.  Hypertension On carvedilol and losartan for blood pressure management. Current medication regimen includes carvedilol 12.5 mg twice a day and losartan 100 mg once a day.  Hyperlipidemia Not currently taking any prescribed cholesterol medications due to past adverse effects. Using black seed oil as an alternative. Cannot take Crestor, Lipitor, Zetia, or Nexletol due to past issues.  Smoking Admits to smoking occasionally, despite previously quitting. Recommends cessation. - Advise smoking cessation.  General Health Maintenance Declined mammogram, colonoscopy, and HIV screening. Agreed to receive the shingles vaccine despite uncertainty about past chickenpox infection. Explained that the shingles vaccine is safe even without a history of chickenpox and helps prevent shingles, which can be severe. Recommended the new pneumonia vaccine that covers more strains. - Administer shingles vaccine (first dose) today. - Administer new pneumonia vaccine today. - Schedule second dose of shingles vaccine in three months.         Hypertensive heart disease with chronic systolic congestive heart failure [facility]) Assessment & Plan: On carvedilol and losartan for blood pressure management. Current medication regimen includes carvedilol 12.5 mg twice a day and losartan 100 mg once a day.  Orders: -     CBC with Differential/Platelet -     Comprehensive metabolic panel  Coronary artery disease involving native coronary artery of native heart without angina pectoris Assessment & Plan: Well controlled.  Continue Carvedilol 3.125  mg 1 tablet twice daily and aspirin 81 mg daily. Management per specialist.     Gastroesophageal reflux disease with esophagitis, unspecified whether hemorrhage Assessment & Plan: The current medical regimen is effective;  continue present plan and medications.  Continue omeprazole 20 mg daily as needed.    Diabetic glomerulopathy (HCC) Assessment &  Plan: Experiences elevated blood glucose levels, particularly around supper time, despite being on metformin and Semglee (insulin glargine). The [facility] device is not consistently recording glucose levels, complicating monitoring. A1c has reportedly dropped to 6.7%, indicating some level of control. Considering adding a short-acting insulin before meals to address postprandial spikes but wants to confirm the A1c level before making changes. Discussed moving Semglee administration to before supper to potentially improve control, acknowledging that insulin may decrease in effectiveness towards the end of the 24-hour period. - Check A1c level to confirm current control. - Consider adding short-acting insulin before meals if A1c is not at target. - Advise moving Semglee administration to before supper.  Orders: -     Hemoglobin A1c -     Microalbumin / creatinine urine ratio  Cigarette nicotine dependence with nicotine-induced disorder Assessment & Plan: Admits to smoking occasionally, despite previously quitting. Recommends cessation. - Advise smoking cessation.   Mixed hyperlipidemia Assessment & Plan: Not currently taking any prescribed cholesterol medications due to past adverse effects. Using black seed oil as an alternative. Cannot take Crestor, Lipitor, Zetia, or Nexletol due to past issues.  Orders: -     Lipid panel  Acute non-recurrent maxillary sinusitis Assessment & Plan: Reports sinus infection symptoms, including facial pain and sinus drainage, likely exacerbated by outdoor exposure. Symptoms include pain in the jaw and teeth, and sinus drainage causing cough. Antihistamines like Zyrtec or Benadryl help alleviate symptoms. - Prescribe Z-Pak (azithromycin) for sinus infection. - Recommend antihistamines like Zyrtec or Benadryl for allergy relief.  Orders: -     Azithromycin; Take 2 tablets on day 1, then 1 tablet daily on days 2 through 5  Dispense: 6 tablet; Refill:  0  Tobacco use disorder Assessment & Plan: Admits to smoking occasionally, despite previously quitting. Recommends cessation. - Advise smoking cessation.   Need for shingles vaccine -     Varicella-zoster vaccine IM  Need for vaccination -     Varicella-zoster vaccine IM     Meds ordered this encounter  Medications   azithromycin (ZITHROMAX) 250 MG tablet    Sig: Take 2 tablets on day 1, then 1 tablet daily on days 2 through 5    Dispense:  6 tablet    Refill:  0    Orders Placed This Encounter  Procedures   Zoster, Recombinant (Shingrix)   CBC with Differential/Platelet   Comprehensive metabolic panel   Hemoglobin A1c   Microalbumin / creatinine urine ratio   Lipid panel     Follow-up: Return in about 3 months (around 03/28/2024) for chronic follow up.   I,Marla I Leal-Borjas,acting as a scribe for Blane Ohara, MD.,have documented all relevant documentation on the behalf of Blane Ohara, MD,as directed by  Blane Ohara, MD while in the presence of Blane Ohara, MD.   An After Visit Summary was printed and given to the patient.  I attest that I have reviewed this visit and agree with the plan scribed by my staff.   Blane Ohara, MD [facility] [phone]

## 2023-12-27 ENCOUNTER — Encounter: Payer: Self-pay | Admitting: Family Medicine

## 2023-12-27 ENCOUNTER — Ambulatory Visit: Payer: BC Managed Care – PPO | Admitting: Family Medicine

## 2023-12-27 VITALS — BP 138/68 | HR 72 | Temp 98.0°F | Ht 64.0 in | Wt 190.0 lb

## 2023-12-27 DIAGNOSIS — E1121 Type 2 diabetes mellitus with diabetic nephropathy: Secondary | ICD-10-CM | POA: Diagnosis not present

## 2023-12-27 DIAGNOSIS — J0100 Acute maxillary sinusitis, unspecified: Secondary | ICD-10-CM

## 2023-12-27 DIAGNOSIS — K2100 Gastro-esophageal reflux disease with esophagitis, without bleeding: Secondary | ICD-10-CM | POA: Diagnosis not present

## 2023-12-27 DIAGNOSIS — I2510 Atherosclerotic heart disease of native coronary artery without angina pectoris: Secondary | ICD-10-CM

## 2023-12-27 DIAGNOSIS — I5022 Chronic systolic (congestive) heart failure: Secondary | ICD-10-CM | POA: Diagnosis not present

## 2023-12-27 DIAGNOSIS — F17200 Nicotine dependence, unspecified, uncomplicated: Secondary | ICD-10-CM

## 2023-12-27 DIAGNOSIS — F17219 Nicotine dependence, cigarettes, with unspecified nicotine-induced disorders: Secondary | ICD-10-CM

## 2023-12-27 DIAGNOSIS — Z23 Encounter for immunization: Secondary | ICD-10-CM

## 2023-12-27 DIAGNOSIS — I110 Hypertensive heart disease with heart failure: Secondary | ICD-10-CM

## 2023-12-27 DIAGNOSIS — E782 Mixed hyperlipidemia: Secondary | ICD-10-CM | POA: Diagnosis not present

## 2023-12-27 MED ORDER — AZITHROMYCIN 250 MG PO TABS: ORAL_TABLET | ORAL | 0 refills | Status: AC

## 2023-12-27 NOTE — Patient Instructions (Signed)
 VISIT SUMMARY:  Today, we discussed your concerns about glucose monitoring and sinus symptoms. We reviewed your current medications and lifestyle habits, and we made some adjustments to help manage your diabetes and sinusitis more effectively. We also addressed your blood pressure, cholesterol, and smoking habits, and provided recommendations for general health maintenance.  YOUR PLAN:  -TYPE 2 DIABETES MELLITUS: Type 2 diabetes is a condition where your body does not use insulin properly, leading to high blood sugar levels. We will check your A1c level to confirm your current control. If needed, we may add a short-acting insulin before meals to help manage your blood sugar spikes after eating. We also recommend moving your Semglee insulin administration to before supper to improve control.  -SINUSITIS: Sinusitis is an inflammation of the sinuses that can cause pain, drainage, and discomfort. We have prescribed a Z-Pak (azithromycin) to treat the sinus infection and recommend continuing with antihistamines like Zyrtec or Benadryl for allergy relief.  -HYPERTENSION: Hypertension, or high blood pressure, is being managed with your current medications, carvedilol and losartan. No changes to your medication regimen are needed at this time.  -HYPERLIPIDEMIA: Hyperlipidemia is having high levels of fats in your blood. You are not currently taking prescribed cholesterol medications due to past adverse effects, but you are using black seed oil as an alternative.  -SMOKING: Smoking can have many negative health effects. We strongly advise you to quit smoking completely.  -GENERAL HEALTH MAINTENANCE: For your general health, we administered the first dose of the shingles vaccine and the new pneumonia vaccine today. We will schedule the second dose of the shingles vaccine in three months. These vaccines help prevent serious infections.  INSTRUCTIONS:  Please follow up to check your A1c level. If your A1c is  not at the target level, we may need to add a short-acting insulin before meals. Also, schedule an appointment in three months for the second dose of the shingles vaccine.

## 2023-12-28 LAB — MICROALBUMIN / CREATININE URINE RATIO
Creatinine, Urine: 108.1 mg/dL
Microalb/Creat Ratio: 43 mg/g{creat} — ABNORMAL HIGH (ref 0–29)
Microalbumin, Urine: 46.7 ug/mL

## 2023-12-28 LAB — COMPREHENSIVE METABOLIC PANEL
ALT: 9 IU/L (ref 0–32)
AST: 14 IU/L (ref 0–40)
Albumin: 4.1 g/dL (ref 3.9–4.9)
Alkaline Phosphatase: 105 IU/L (ref 44–121)
BUN/Creatinine Ratio: 19 (ref 12–28)
BUN: 11 mg/dL (ref 8–27)
Bilirubin Total: 0.4 mg/dL (ref 0.0–1.2)
CO2: 22 mmol/L (ref 20–29)
Calcium: 9.1 mg/dL (ref 8.7–10.3)
Chloride: 102 mmol/L (ref 96–106)
Creatinine, Ser: 0.59 mg/dL (ref 0.57–1.00)
Globulin, Total: 2.7 g/dL (ref 1.5–4.5)
Glucose: 87 mg/dL (ref 70–99)
Potassium: 4.5 mmol/L (ref 3.5–5.2)
Sodium: 139 mmol/L (ref 134–144)
Total Protein: 6.8 g/dL (ref 6.0–8.5)
eGFR: 101 mL/min/{1.73_m2} (ref >59)

## 2023-12-28 LAB — CBC WITH DIFFERENTIAL/PLATELET
Basophils Absolute: 0.1 10*3/uL (ref 0.0–0.2)
Basos: 1 %
EOS (ABSOLUTE): 0.2 10*3/uL (ref 0.0–0.4)
Eos: 3 %
Hematocrit: 46.2 % (ref 34.0–46.6)
Hemoglobin: 14.8 g/dL (ref 11.1–15.9)
Immature Grans (Abs): 0 10*3/uL (ref 0.0–0.1)
Immature Granulocytes: 0 %
Lymphocytes Absolute: 2.3 10*3/uL (ref 0.7–3.1)
Lymphs: 34 %
MCH: 28.1 pg (ref 26.6–33.0)
MCHC: 32 g/dL (ref 31.5–35.7)
MCV: 88 fL (ref 79–97)
Monocytes Absolute: 0.5 10*3/uL (ref 0.1–0.9)
Monocytes: 7 %
Neutrophils Absolute: 3.9 10*3/uL (ref 1.4–7.0)
Neutrophils: 55 %
Platelets: 260 10*3/uL (ref 150–450)
RBC: 5.26 x10E6/uL (ref 3.77–5.28)
RDW: 13.2 % (ref 11.7–15.4)
WBC: 7 10*3/uL (ref 3.4–10.8)

## 2023-12-28 LAB — HEMOGLOBIN A1C
Est. average glucose Bld gHb Est-mCnc: 166 mg/dL
Hgb A1c MFr Bld: 7.4 % — ABNORMAL HIGH (ref 4.8–5.6)

## 2023-12-28 LAB — LIPID PANEL
Chol/HDL Ratio: 3.1 ratio (ref 0.0–4.4)
Cholesterol, Total: 163 mg/dL (ref 100–199)
HDL: 52 mg/dL (ref >39)
LDL Chol Calc (NIH): 92 mg/dL (ref 0–99)
Triglycerides: 107 mg/dL (ref 0–149)
VLDL Cholesterol Cal: 19 mg/dL (ref 5–40)

## 2023-12-29 DIAGNOSIS — J0100 Acute maxillary sinusitis, unspecified: Secondary | ICD-10-CM | POA: Insufficient documentation

## 2023-12-29 NOTE — Assessment & Plan Note (Signed)
 Reports sinus infection symptoms, including facial pain and sinus drainage, likely exacerbated by outdoor exposure. Symptoms include pain in the jaw and teeth, and sinus drainage causing cough. Antihistamines like Zyrtec or Benadryl help alleviate symptoms. - Prescribe Z-Pak (azithromycin) for sinus infection. - Recommend antihistamines like Zyrtec or Benadryl for allergy relief.

## 2023-12-29 NOTE — Assessment & Plan Note (Signed)
The current medical regimen is effective;  continue present plan and medications.  Continue omeprazole 20 mg daily as needed.

## 2023-12-29 NOTE — Assessment & Plan Note (Signed)
 Well controlled.  On carvedilol and losartan for blood pressure management.  Current medication regimen includes carvedilol 12.5 mg twice a day and losartan 100 mg once a day. Check labs.

## 2023-12-29 NOTE — Assessment & Plan Note (Signed)
 Well controlled.  Continue Carvedilol 3.125 mg 1 tablet twice daily and aspirin 81 mg daily. Management per specialist.

## 2023-12-29 NOTE — Assessment & Plan Note (Signed)
 Admits to smoking occasionally, despite previously quitting. Recommends cessation. - Advise smoking cessation.

## 2023-12-29 NOTE — Assessment & Plan Note (Signed)
 Not currently taking any prescribed cholesterol medications due to past adverse effects. Using black seed oil as an alternative. Cannot take Crestor, Lipitor, Zetia, or Nexletol due to past issues.

## 2023-12-29 NOTE — Assessment & Plan Note (Deleted)
Sent Z-pack

## 2023-12-29 NOTE — Assessment & Plan Note (Signed)
 Experiences elevated blood glucose levels, particularly around supper time, despite being on metformin and Semglee (insulin glargine). The [facility] device is not consistently recording glucose levels, complicating monitoring. A1c has reportedly dropped to 6.7%, indicating some level of control. Considering adding a short-acting insulin before meals to address postprandial spikes but wants to confirm the A1c level before making changes. Discussed moving Semglee administration to before supper to potentially improve control, acknowledging that insulin may decrease in effectiveness towards the end of the 24-hour period. - Check A1c level to confirm current control. - Consider adding short-acting insulin before meals if A1c is not at target. - Advise moving Semglee administration to before supper.

## 2023-12-30 ENCOUNTER — Encounter: Payer: Self-pay | Admitting: Family Medicine

## 2024-01-07 ENCOUNTER — Other Ambulatory Visit: Payer: Self-pay | Admitting: Family Medicine

## 2024-01-11 ENCOUNTER — Ambulatory Visit: Payer: BC Managed Care – PPO | Admitting: Family Medicine

## 2024-01-17 ENCOUNTER — Ambulatory Visit: Payer: BC Managed Care – PPO

## 2024-01-17 DIAGNOSIS — I255 Ischemic cardiomyopathy: Secondary | ICD-10-CM

## 2024-01-17 LAB — CUP PACEART REMOTE DEVICE CHECK
Battery Remaining Longevity: 61 mo
Battery Remaining Percentage: 60 %
Battery Voltage: 2.96 V
Brady Statistic RV Percent Paced: 1 %
Date Time Interrogation Session: 20250410020017
HighPow Impedance: 80 Ohm
HighPow Impedance: 80 Ohm
Implantable Lead Connection Status: 753985
Implantable Lead Implant Date: 20200604
Implantable Lead Location: 753860
Implantable Pulse Generator Implant Date: 20200604
Lead Channel Impedance Value: 480 Ohm
Lead Channel Pacing Threshold Amplitude: 0.5 V
Lead Channel Pacing Threshold Pulse Width: 0.5 ms
Lead Channel Sensing Intrinsic Amplitude: 11.3 mV
Lead Channel Setting Pacing Amplitude: 2.5 V
Lead Channel Setting Pacing Pulse Width: 0.5 ms
Lead Channel Setting Sensing Sensitivity: 0.5 mV
Pulse Gen Serial Number: 9860889
Zone Setting Status: 755011

## 2024-02-25 NOTE — Progress Notes (Signed)
 Remote ICD transmission.

## 2024-03-21 ENCOUNTER — Other Ambulatory Visit: Payer: Self-pay | Admitting: Family Medicine

## 2024-03-24 ENCOUNTER — Other Ambulatory Visit: Payer: Self-pay | Admitting: Family Medicine

## 2024-04-01 NOTE — Progress Notes (Signed)
 Subjective:  Patient ID: Melinda Nguyen, female    DOB: 19-May-1961  Age: 63 y.o. MRN: [id number]  Chief Complaint  Patient presents with   Medical Management of Chronic Issues    HPI:  Diabetes:  Complications: Glomerulopathy Glucose checking: CGM Glucose logs: 100-180. She feels the best 120-140. Spikes to 250-300 when she eats.  Most recent A1C: 7.4% Current medications: Takes Metformin  1000 mg 1 tablet in am and takes Semglee  insulin  before bed at 20 -24 U before bed.  Foot checks: daily Eye visit: Dr. Jorge Eating healthier.   Hyperlipidemia: Current medications: Patient is intolerant to statins and not taking nexlitol. Taking black seed oil and magnesium  400 mg daily.   Hypertensive Heart Disease with Chronic Systolic Congestive Heart Failure: Current medications: Patient is currently taking Carvedilol  12.5  mg 1 tablet twice daily, Losartan  100 mg take 1 tablet daily, baby aspirin  81 mg daily, see's Dr. Inocencio once a year and Dr. Krasowski every 6 months.  Depression, mild recurrent: Patient is currently taking Cymbalta  30 mg 1 tablet daily.  Lorazepam  very sparingly.  Patient is doing very well on these medications.  GERD: Omeprazole 20 mg daily as needed.      04/02/2024    8:10 AM 12/27/2023    8:44 AM 09/27/2023    8:31 AM 06/13/2023    8:01 AM 12/06/2022    8:38 AM  Depression screen PHQ 2/9  Decreased Interest 0 0 0 0 0  Down, Depressed, Hopeless 0 1 0 1 0  PHQ - 2 Score 0 1 0 1 0  Altered sleeping 0 1 1 1  0  Tired, decreased energy 1 0 0 1 0  Change in appetite 0 0 1 0 0  Feeling bad or failure about yourself  0 0 0 0 0  Trouble concentrating 0 0 0 0 0  Moving slowly or fidgety/restless 0 0 0 0 0  Suicidal thoughts 0 0 0 0 0  PHQ-9 Score 1 2 2 3  0  Difficult doing work/chores Not difficult at all Not difficult at all Not difficult at all Somewhat difficult Not difficult at all        04/02/2024    8:10 AM  Fall Risk   Falls in the past year? 0  Number  falls in past yr: 0  Injury with Fall? 0  Risk for fall due to : No Fall Risks  Follow up Falls evaluation completed      04/02/2024    8:10 AM 12/27/2023    8:44 AM 09/27/2023    8:31 AM 06/13/2023    8:01 AM  GAD 7 : Generalized Anxiety Score  Nervous, Anxious, on Edge 1 0 0 1  Control/stop worrying 0 0 0 1  Worry too much - different things 0 0 0 1  Trouble relaxing 0 0 1 1  Restless 0 0 0 0  Easily annoyed or irritable 0 0 0 0  Afraid - awful might happen 0 0 0 0  Total GAD 7 Score 1 0 1 4  Anxiety Difficulty Not difficult at all Not difficult at all Not difficult at all Not difficult at all     Patient Care Team: Sherre Clapper, MD as PCP - General (Family Medicine) Inocencio Soyla Lunger, MD as PCP - Electrophysiology (Cardiology) Bernie Lamar PARAS, MD as PCP - Cardiology (Cardiology) [facility]   Review of Systems  Constitutional:  Negative for chills, fatigue and fever.  HENT:  Negative for congestion, ear  pain, rhinorrhea and sore throat.   Respiratory:  Negative for cough and shortness of breath.   Cardiovascular:  Negative for chest pain.  Gastrointestinal:  Negative for abdominal pain, constipation, diarrhea, nausea and vomiting.  Genitourinary:  Negative for dysuria and urgency.  Musculoskeletal:  Negative for back pain and myalgias.  Neurological:  Negative for dizziness, weakness, light-headedness and headaches.  Psychiatric/Behavioral:  Negative for dysphoric mood. The patient is not nervous/anxious.     Current Outpatient Medications on File Prior to Visit  Medication Sig Dispense Refill   aspirin  EC 81 MG tablet Take 1 tablet (81 mg total) by mouth daily. 90 tablet 3   carvedilol  (COREG ) 12.5 MG tablet TAKE 1 TABLET(12.5 MG) BY MOUTH TWICE DAILY 60 tablet 0   Continuous Glucose Sensor (FREESTYLE LIBRE 3 SENSOR) MISC Continuous Glucose sensor 2 each 11   DULoxetine  (CYMBALTA ) 30 MG capsule TAKE 1 CAPSULE(30 MG) BY MOUTH DAILY 90 capsule  3   Insulin  Pen Needle 31G X 6 MM MISC Inject insulin  as directed 100 each 3   LORazepam  (ATIVAN ) 0.5 MG tablet Take 1 tablet (0.5 mg total) by mouth daily as needed. 30 tablet 3   losartan  (COZAAR ) 100 MG tablet TAKE 1 TABLET(100 MG) BY MOUTH DAILY 90 tablet 0   MAGNESIUM  PO Take 1 tablet by mouth daily.     nitroGLYCERIN  (NITROSTAT ) 0.4 MG SL tablet Place 1 tablet (0.4 mg total) under the tongue every 5 (five) minutes as needed. 25 tablet 12   omeprazole (PRILOSEC) 20 MG capsule Take 20 mg by mouth daily as needed (acid reflux).     SEMGLEE , YFGN, 100 UNIT/ML Pen ADMINISTER 34 UNITS UNDER THE SKIN DAILY 30 mL 1   No current facility-administered medications on file prior to visit.   Past Medical History:  Diagnosis Date   AICD (automatic cardioverter/defibrillator) present 03/13/2019   CAD (coronary artery disease)    CHF (congestive heart failure) (HCC)    Depression    Diabetes mellitus without complication (HCC)    GERD (gastroesophageal reflux disease)    Hypertension    Mixed hyperlipidemia    Myocardial infarction (HCC) 2020   Other hypotension    Smoking history 03/10/2019   Smokes 1/2 pack a day   Ventricular tachycardia [facility])    Past Surgical History:  Procedure Laterality Date   ABDOMINAL HYSTERECTOMY     CATARACT EXTRACTION Bilateral    2016-2017   EYE SURGERY Right 10/2021   gel leaking/laser surgery   ICD IMPLANT N/A 03/13/2019   Procedure: ICD IMPLANT;  Surgeon: Inocencio Soyla Lunger, MD;  Location: [facility];  Service: Cardiovascular;  Laterality: N/A;   ICD IMPLANT  03/13/2019   LEFT HEART CATH AND CORONARY ANGIOGRAPHY N/A 03/12/2019   Procedure: LEFT HEART CATH AND CORONARY ANGIOGRAPHY;  Surgeon: Darron Deatrice LABOR, MD;  Location: [facility]VAS[facility] CV [facility];  Service: Cardiovascular;  Laterality: N/A;    Family History  Problem Relation Age of Onset   Hypertension Mother    Diabetes Mother    COPD Father    Social History   Socioeconomic History    Marital status: Widowed    Spouse name: Not on file   Number of children: Not on file   Years of education: Not on file   Highest education level: Not on file  Occupational History   Not on file  Tobacco Use   Smoking status: Every Day    Current packs/day: 0.50    Average packs/day: 0.5 packs/day for  2.2 years (1.1 ttl pk-yrs)    Types: Cigarettes    Start date: 01/2022    Last attempt to quit: 03/2019   Smokeless tobacco: Never  Vaping Use   Vaping status: Never Used  Substance and Sexual Activity   Alcohol use: Not Currently   Drug use: Never   Sexual activity: Not on file  Other Topics Concern   Not on file  Social History Narrative   Not on file   Social Drivers of Health   Financial Resource Strain: Low Risk  (04/02/2024)   Overall Financial Resource Strain (CARDIA)    Difficulty of Paying Living Expenses: Not hard at all  Food Insecurity: No Food Insecurity (04/02/2024)   Hunger Vital Sign    Worried About Running Out of Food in the Last Year: Never true    Ran Out of Food in the Last Year: Never true  Transportation Needs: No Transportation Needs (04/02/2024)   PRAPARE - Administrator, Civil Service (Medical): No    Lack of Transportation (Non-Medical): No  Physical Activity: Sufficiently Active (04/02/2024)   Exercise Vital Sign    Days of Exercise per Week: 3 days    Minutes of Exercise per Session: 60 min  Stress: No Stress Concern Present (04/02/2024)   [employer] of Occupational Health - Occupational Stress Questionnaire    Feeling of Stress: Not at all  Social Connections: Moderately Isolated (04/02/2024)   Social Connection and Isolation Panel    Frequency of Communication with Friends and Family: Three times a week    Frequency of Social Gatherings with Friends and Family: Three times a week    Attends Religious Services: More than 4 times per year    Active Member of Clubs or Organizations: No    Attends Banker Meetings:  Never    Marital Status: Widowed    Objective:  BP 130/60   Pulse 87   Temp 98.1 F (36.7 C)   Ht 5' 4 (1.626 m)   Wt 185 lb (83.9 kg)   LMP  (LMP Unknown)   SpO2 98%   BMI 31.76 kg/m      04/02/2024    8:05 AM 12/27/2023    8:45 AM 09/27/2023    8:08 AM  BP/Weight  Systolic BP 130 138 112  Diastolic BP 60 68 62  Wt. (Lbs) 185 190 195  BMI 31.76 kg/m2 32.61 kg/m2 33.47 kg/m2    Physical Exam Vitals reviewed.  Constitutional:      Appearance: Normal appearance. She is obese.  Neck:     Vascular: No carotid bruit.   Cardiovascular:     Rate and Rhythm: Normal rate and regular rhythm.     Pulses: Normal pulses.     Heart sounds: Normal heart sounds.  Pulmonary:     Effort: Pulmonary effort is normal. No respiratory distress.     Breath sounds: Normal breath sounds.  Abdominal:     General: Abdomen is flat. Bowel sounds are normal.     Palpations: Abdomen is soft.     Tenderness: There is no abdominal tenderness.   Neurological:     Mental Status: She is alert and oriented to person, place, and time.   Psychiatric:        Mood and Affect: Mood normal.        Behavior: Behavior normal.      Diabetic foot exam was performed with the following findings:   No deformities, ulcerations, or other skin breakdown  Normal sensation of 10g monofilament Intact posterior tibialis and dorsalis pedis pulses      Lab Results  Component Value Date   WBC 7.5 04/02/2024   HGB 14.6 04/02/2024   HCT 45.3 04/02/2024   PLT 251 04/02/2024   GLUCOSE 120 (H) 04/02/2024   CHOL 191 04/02/2024   TRIG 124 04/02/2024   HDL 58 04/02/2024   LDLCALC 111 (H) 04/02/2024   ALT 12 04/02/2024   AST 14 04/02/2024   NA 139 04/02/2024   K 4.2 04/02/2024   CL 99 04/02/2024   CREATININE 0.62 04/02/2024   BUN 10 04/02/2024   CO2 23 04/02/2024   TSH 2.720 12/06/2022   HGBA1C 7.3 (H) 04/02/2024   MICROALBUR 80 06/23/2021      Assessment & Plan:  Hypertensive heart disease with  chronic systolic congestive heart failure (HCC) Assessment & Plan: Well controlled.  On carvedilol  and losartan  for blood pressure management.  Current medication regimen includes carvedilol  12.5 mg twice a day and losartan  100 mg once a day. Check labs.  Orders: -     CBC with Differential/Platelet -     Comprehensive metabolic panel with GFR  Diabetic glomerulopathy (HCC) Assessment & Plan: Control: Fair controlled Recommend check sugars fasting daily. Recommend check feet daily. Recommend annual eye exams. Medicines: Takes Metformin  1000 mg 1 tablet in am and takes Semglee  units before bed at 20 -24 U before bed.  Continue to work on eating a healthy diet and exercise.  Labs drawn today.     Orders: -     Hemoglobin A1c -     Microalbumin / creatinine urine ratio  Mixed hyperlipidemia Assessment & Plan: Not currently taking any prescribed cholesterol medications due to past adverse effects. Using black seed oil as an alternative. Cannot take Crestor, Lipitor , Zetia , or Nexletol  due to past issues.  Orders: -     Lipid panel  Depression, major, recurrent, mild (HCC) Assessment & Plan: The current medical regimen is effective;  continue present plan and medications. Continue duloxetine  30 mg daily.    Encounter for immunization -     Varicella-zoster vaccine IM     No orders of the defined types were placed in this encounter.   Orders Placed This Encounter  Procedures   Varicella-zoster vaccine IM   CBC with Differential/Platelet   Comprehensive metabolic panel with GFR   Hemoglobin A1c   Lipid panel   Microalbumin / creatinine urine ratio     Follow-up: Return in about 3 months (around 07/03/2024) for chronic fasting.   I,Marla I Leal-Borjas,acting as a scribe for Abigail Free, MD.,have documented all relevant documentation on the behalf of Abigail Free, MD,as directed by  Abigail Free, MD while in the presence of Abigail Free, MD.   An After Visit Summary  was printed and given to the patient.  I attest that I have reviewed this visit and agree with the plan scribed by my staff.   Abigail Free, MD [facility] [phone]

## 2024-04-02 ENCOUNTER — Ambulatory Visit (INDEPENDENT_AMBULATORY_CARE_PROVIDER_SITE_OTHER): Payer: Self-pay | Admitting: Family Medicine

## 2024-04-02 ENCOUNTER — Encounter: Payer: Self-pay | Admitting: Family Medicine

## 2024-04-02 VITALS — BP 130/60 | HR 87 | Temp 98.1°F | Ht 64.0 in | Wt 185.0 lb

## 2024-04-02 DIAGNOSIS — I110 Hypertensive heart disease with heart failure: Secondary | ICD-10-CM | POA: Diagnosis not present

## 2024-04-02 DIAGNOSIS — F330 Major depressive disorder, recurrent, mild: Secondary | ICD-10-CM

## 2024-04-02 DIAGNOSIS — E1121 Type 2 diabetes mellitus with diabetic nephropathy: Secondary | ICD-10-CM | POA: Diagnosis not present

## 2024-04-02 DIAGNOSIS — I5022 Chronic systolic (congestive) heart failure: Secondary | ICD-10-CM | POA: Diagnosis not present

## 2024-04-02 DIAGNOSIS — E782 Mixed hyperlipidemia: Secondary | ICD-10-CM | POA: Diagnosis not present

## 2024-04-02 DIAGNOSIS — Z23 Encounter for immunization: Secondary | ICD-10-CM | POA: Diagnosis not present

## 2024-04-02 NOTE — Patient Instructions (Signed)
 I included some information about kerendia (finerenone.) This is the medicine to help your kidneys filter better.

## 2024-04-03 ENCOUNTER — Ambulatory Visit: Payer: Self-pay | Admitting: Family Medicine

## 2024-04-03 LAB — LIPID PANEL
Chol/HDL Ratio: 3.3 ratio (ref 0.0–4.4)
Cholesterol, Total: 191 mg/dL (ref 100–199)
HDL: 58 mg/dL (ref >39)
LDL Chol Calc (NIH): 111 mg/dL — ABNORMAL HIGH (ref 0–99)
Triglycerides: 124 mg/dL (ref 0–149)
VLDL Cholesterol Cal: 22 mg/dL (ref 5–40)

## 2024-04-03 LAB — CBC WITH DIFFERENTIAL/PLATELET
Basophils Absolute: 0.1 10*3/uL (ref 0.0–0.2)
Basos: 1 %
EOS (ABSOLUTE): 0.2 10*3/uL (ref 0.0–0.4)
Eos: 2 %
Hematocrit: 45.3 % (ref 34.0–46.6)
Hemoglobin: 14.6 g/dL (ref 11.1–15.9)
Immature Grans (Abs): 0 10*3/uL (ref 0.0–0.1)
Immature Granulocytes: 0 %
Lymphocytes Absolute: 2.1 10*3/uL (ref 0.7–3.1)
Lymphs: 28 %
MCH: 28.7 pg (ref 26.6–33.0)
MCHC: 32.2 g/dL (ref 31.5–35.7)
MCV: 89 fL (ref 79–97)
Monocytes Absolute: 0.5 10*3/uL (ref 0.1–0.9)
Monocytes: 7 %
Neutrophils Absolute: 4.7 10*3/uL (ref 1.4–7.0)
Neutrophils: 62 %
Platelets: 251 10*3/uL (ref 150–450)
RBC: 5.09 x10E6/uL (ref 3.77–5.28)
RDW: 13.7 % (ref 11.7–15.4)
WBC: 7.5 10*3/uL (ref 3.4–10.8)

## 2024-04-03 LAB — HEMOGLOBIN A1C
Est. average glucose Bld gHb Est-mCnc: 163 mg/dL
Hgb A1c MFr Bld: 7.3 % — ABNORMAL HIGH (ref 4.8–5.6)

## 2024-04-03 LAB — COMPREHENSIVE METABOLIC PANEL WITH GFR
ALT: 12 IU/L (ref 0–32)
AST: 14 IU/L (ref 0–40)
Albumin: 4.3 g/dL (ref 3.9–4.9)
Alkaline Phosphatase: 102 IU/L (ref 44–121)
BUN/Creatinine Ratio: 16 (ref 12–28)
BUN: 10 mg/dL (ref 8–27)
Bilirubin Total: 0.4 mg/dL (ref 0.0–1.2)
CO2: 23 mmol/L (ref 20–29)
Calcium: 9.1 mg/dL (ref 8.7–10.3)
Chloride: 99 mmol/L (ref 96–106)
Creatinine, Ser: 0.62 mg/dL (ref 0.57–1.00)
Globulin, Total: 2.6 g/dL (ref 1.5–4.5)
Glucose: 120 mg/dL — ABNORMAL HIGH (ref 70–99)
Potassium: 4.2 mmol/L (ref 3.5–5.2)
Sodium: 139 mmol/L (ref 134–144)
Total Protein: 6.9 g/dL (ref 6.0–8.5)
eGFR: 99 mL/min/{1.73_m2} (ref >59)

## 2024-04-03 LAB — MICROALBUMIN / CREATININE URINE RATIO
Creatinine, Urine: 144.5 mg/dL
Microalb/Creat Ratio: 69 mg/g{creat} — ABNORMAL HIGH (ref 0–29)
Microalbumin, Urine: 99.6 ug/mL

## 2024-04-05 ENCOUNTER — Other Ambulatory Visit: Payer: Self-pay | Admitting: Family Medicine

## 2024-04-06 NOTE — Assessment & Plan Note (Signed)
 Well controlled.  On carvedilol and losartan for blood pressure management.  Current medication regimen includes carvedilol 12.5 mg twice a day and losartan 100 mg once a day. Check labs.

## 2024-04-06 NOTE — Assessment & Plan Note (Deleted)
 Experiences elevated blood glucose levels, particularly around supper time, despite being on metformin and Semglee (insulin glargine). The [facility] device is not consistently recording glucose levels, complicating monitoring. A1c has reportedly dropped to 6.7%, indicating some level of control. Considering adding a short-acting insulin before meals to address postprandial spikes but wants to confirm the A1c level before making changes. Discussed moving Semglee administration to before supper to potentially improve control, acknowledging that insulin may decrease in effectiveness towards the end of the 24-hour period. - Check A1c level to confirm current control. - Consider adding short-acting insulin before meals if A1c is not at target. - Advise moving Semglee administration to before supper.

## 2024-04-06 NOTE — Assessment & Plan Note (Signed)
The current medical regimen is effective;  continue present plan and medications. Continue duloxetine 30 mg daily.

## 2024-04-06 NOTE — Assessment & Plan Note (Signed)
 Not currently taking any prescribed cholesterol medications due to past adverse effects. Using black seed oil as an alternative. Cannot take Crestor, Lipitor, Zetia, or Nexletol due to past issues.

## 2024-04-06 NOTE — Assessment & Plan Note (Signed)
 Control: Fair controlled Recommend check sugars fasting daily. Recommend check feet daily. Recommend annual eye exams. Medicines: Takes Metformin  1000 mg 1 tablet in am and takes Semglee  units before bed at 20 -24 U before bed.  Continue to work on eating a healthy diet and exercise.  Labs drawn today.

## 2024-04-17 ENCOUNTER — Ambulatory Visit: Payer: BC Managed Care – PPO

## 2024-04-17 DIAGNOSIS — I255 Ischemic cardiomyopathy: Secondary | ICD-10-CM

## 2024-04-18 LAB — CUP PACEART REMOTE DEVICE CHECK
Battery Remaining Longevity: 59 mo
Battery Remaining Percentage: 58 %
Battery Voltage: 2.96 V
Brady Statistic RV Percent Paced: 1 %
Date Time Interrogation Session: 20250710022458
HighPow Impedance: 82 Ohm
HighPow Impedance: 82 Ohm
Implantable Lead Connection Status: 753985
Implantable Lead Implant Date: 20200604
Implantable Lead Location: 753860
Implantable Pulse Generator Implant Date: 20200604
Lead Channel Impedance Value: 460 Ohm
Lead Channel Pacing Threshold Amplitude: 0.5 V
Lead Channel Pacing Threshold Pulse Width: 0.5 ms
Lead Channel Sensing Intrinsic Amplitude: 11.3 mV
Lead Channel Setting Pacing Amplitude: 2.5 V
Lead Channel Setting Pacing Pulse Width: 0.5 ms
Lead Channel Setting Sensing Sensitivity: 0.5 mV
Pulse Gen Serial Number: 9860889
Zone Setting Status: 755011

## 2024-04-23 ENCOUNTER — Ambulatory Visit: Payer: Self-pay | Admitting: Cardiology

## 2024-04-24 ENCOUNTER — Other Ambulatory Visit: Payer: Self-pay

## 2024-04-24 MED ORDER — CARVEDILOL 12.5 MG PO TABS: 12.5000 mg | ORAL_TABLET | Freq: Two times a day (BID) | ORAL | 0 refills | Status: DC

## 2024-04-30 DIAGNOSIS — E113493 Type 2 diabetes mellitus with severe nonproliferative diabetic retinopathy without macular edema, bilateral: Secondary | ICD-10-CM | POA: Diagnosis not present

## 2024-04-30 DIAGNOSIS — H35371 Puckering of macula, right eye: Secondary | ICD-10-CM | POA: Diagnosis not present

## 2024-04-30 DIAGNOSIS — Z961 Presence of intraocular lens: Secondary | ICD-10-CM | POA: Diagnosis not present

## 2024-04-30 DIAGNOSIS — H35341 Macular cyst, hole, or pseudohole, right eye: Secondary | ICD-10-CM | POA: Diagnosis not present

## 2024-06-24 ENCOUNTER — Other Ambulatory Visit: Payer: Self-pay | Admitting: Family Medicine

## 2024-06-26 ENCOUNTER — Other Ambulatory Visit: Payer: Self-pay | Admitting: Family Medicine

## 2024-07-15 ENCOUNTER — Encounter: Payer: Self-pay | Admitting: Family Medicine

## 2024-07-15 ENCOUNTER — Ambulatory Visit: Payer: Self-pay | Admitting: Family Medicine

## 2024-07-15 VITALS — BP 138/72 | HR 91 | Temp 98.0°F | Ht 64.0 in | Wt 182.0 lb

## 2024-07-15 DIAGNOSIS — K5909 Other constipation: Secondary | ICD-10-CM

## 2024-07-15 DIAGNOSIS — F330 Major depressive disorder, recurrent, mild: Secondary | ICD-10-CM

## 2024-07-15 DIAGNOSIS — E1121 Type 2 diabetes mellitus with diabetic nephropathy: Secondary | ICD-10-CM

## 2024-07-15 DIAGNOSIS — E782 Mixed hyperlipidemia: Secondary | ICD-10-CM | POA: Diagnosis not present

## 2024-07-15 DIAGNOSIS — I5022 Chronic systolic (congestive) heart failure: Secondary | ICD-10-CM

## 2024-07-15 DIAGNOSIS — K2100 Gastro-esophageal reflux disease with esophagitis, without bleeding: Secondary | ICD-10-CM

## 2024-07-15 DIAGNOSIS — I110 Hypertensive heart disease with heart failure: Secondary | ICD-10-CM

## 2024-07-15 LAB — POCT LIPID PANEL
HDL: 60
LDL: 84
Non-HDL: 104
TC: 164
TRG: 104

## 2024-07-15 LAB — POCT GLYCOSYLATED HEMOGLOBIN (HGB A1C): HbA1c POC (<> result, manual entry): 7.3 % (ref 4.0–5.6)

## 2024-07-15 NOTE — Progress Notes (Unsigned)
 Subjective:  Patient ID: Melinda Nguyen, female    DOB: 1961/03/12  Age: 63 y.o. MRN: [id number]  Chief Complaint  Patient presents with   Medical Management of Chronic Issues    HPI: Discussed the use of AI scribe software for clinical note transcription with the patient, who gave verbal consent to proceed.  History of Present Illness Melinda Nguyen is a 63 year old female with type 2 diabetes and hypertension who presents for a follow-up visit.  Weight loss and lifestyle modification - Significant weight loss of approximately 40 pounds since April - Weight loss achieved through dietary changes and increased physical activity - Diet includes increased fruits, vegetables, and protein; avoids sweets and fried foods - Physical activity consists of walking four miles three to four times per week and swimming at the recreation center  Glycemic control and diabetes management - Type 2 diabetes - Reduced metformin  intake - Currently taking berberine with chromium and cinnamon, which she believes has increased her energy levels - Regular blood glucose monitoring - Last hemoglobin A1c was 6.9 - Adjusts insulin  dosage based on nightly blood glucose readings: 18 units if 180 mg/dL, 20 units if 799 mg/dL, 14 units if 859 mg/dL - Leg soreness has improved since reducing metformin  use  Hypertension and cardiovascular management - Hypertension - Takes carvedilol  12.5 mg twice daily and losartan  100 mg once daily - Stopped taking baby aspirin  due to gastrointestinal discomfort  Hyperlipidemia and medication intolerance - Intolerance to statins and Noxafil - Uses black seed oil and magnesium  for cholesterol management  Gastrointestinal symptoms - Constipation, with up to three days between bowel movements unless using Miralax - Occasional use of Miralax for relief - Uses omeprazole as needed for acid reflux - Stopped baby aspirin  due to gastrointestinal discomfort  Depression and anxiety -  Takes Cymbalta  30 mg daily for depression - Uses lorazepam  as needed for anxiety, but rarely requires it  General symptoms - No fevers, chills, sweats, earaches, sore throat, stuffy nose, or chest pain   Taking Nutriglutide - berbarine, chromium and cinnamon      07/15/2024    8:10 AM 04/02/2024    8:10 AM 12/27/2023    8:44 AM 09/27/2023    8:31 AM 06/13/2023    8:01 AM  Depression screen PHQ 2/9  Decreased Interest 0 0 0 0 0  Down, Depressed, Hopeless 0 0 1 0 1  PHQ - 2 Score 0 0 1 0 1  Altered sleeping 0 0 1 1 1   Tired, decreased energy 0 1 0 0 1  Change in appetite 0 0 0 1 0  Feeling bad or failure about yourself  0 0 0 0 0  Trouble concentrating 0 0 0 0 0  Moving slowly or fidgety/restless 0 0 0 0 0  Suicidal thoughts 0 0 0 0 0  PHQ-9 Score 0 1 2 2 3   Difficult doing work/chores Not difficult at all Not difficult at all Not difficult at all Not difficult at all Somewhat difficult        07/15/2024    8:10 AM  Fall Risk   Falls in the past year? 0  Number falls in past yr: 0  Injury with Fall? 0  Risk for fall due to : No Fall Risks  Follow up Falls evaluation completed    Patient Care Team: Sherre Clapper, MD as PCP - General (Family Medicine) Inocencio Soyla Lunger, MD as PCP - Electrophysiology (Cardiology) Bernie Lamar PARAS, MD as PCP -  Cardiology (Cardiology) [facility]   Review of Systems  Constitutional:  Negative for chills, fatigue and fever.  HENT:  Negative for congestion, ear pain and sore throat.   Respiratory:  Negative for cough and shortness of breath.   Cardiovascular:  Negative for chest pain.  Gastrointestinal:  Positive for constipation. Negative for abdominal pain, diarrhea, nausea and vomiting.  Genitourinary:  Negative for dysuria and urgency.  Musculoskeletal:  Negative for arthralgias and myalgias.  Skin:  Negative for rash.  Neurological:  Negative for dizziness and headaches.  Psychiatric/Behavioral:  Negative for  dysphoric mood. The patient is not nervous/anxious.     Current Outpatient Medications on File Prior to Visit  Medication Sig Dispense Refill   aspirin  EC 81 MG tablet Take 1 tablet (81 mg total) by mouth daily. 90 tablet 3   carvedilol  (COREG ) 12.5 MG tablet Take 1 tablet (12.5 mg total) by mouth 2 (two) times daily with a meal. 180 tablet 0   Continuous Glucose Sensor (FREESTYLE LIBRE 3 SENSOR) MISC Continuous Glucose sensor 2 each 11   DULoxetine  (CYMBALTA ) 30 MG capsule TAKE 1 CAPSULE(30 MG) BY MOUTH DAILY 90 capsule 1   Insulin  Pen Needle 31G X 6 MM MISC Inject insulin  as directed 100 each 3   LORazepam  (ATIVAN ) 0.5 MG tablet Take 1 tablet (0.5 mg total) by mouth daily as needed. 30 tablet 3   losartan  (COZAAR ) 100 MG tablet TAKE 1 TABLET(100 MG) BY MOUTH DAILY 90 tablet 3   MAGNESIUM  PO Take 1 tablet by mouth daily.     metFORMIN  (GLUCOPHAGE ) 1000 MG tablet TAKE 1 TABLET(1000 MG) BY MOUTH TWICE DAILY WITH A MEAL 180 tablet 1   nitroGLYCERIN  (NITROSTAT ) 0.4 MG SL tablet Place 1 tablet (0.4 mg total) under the tongue every 5 (five) minutes as needed. 25 tablet 12   omeprazole (PRILOSEC) 20 MG capsule Take 20 mg by mouth daily as needed (acid reflux).     SEMGLEE , YFGN, 100 UNIT/ML Pen ADMINISTER 34 UNITS UNDER THE SKIN DAILY 30 mL 1   No current facility-administered medications on file prior to visit.   Past Medical History:  Diagnosis Date   AICD (automatic cardioverter/defibrillator) present 03/13/2019   CAD (coronary artery disease)    CHF (congestive heart failure) (HCC)    Depression    Diabetes mellitus without complication (HCC)    GERD (gastroesophageal reflux disease)    Hypertension    Mixed hyperlipidemia    Myocardial infarction (HCC) 2020   Other hypotension    Smoking history 03/10/2019   Smokes 1/2 pack a day   Ventricular tachycardia [facility])    Past Surgical History:  Procedure Laterality Date   ABDOMINAL HYSTERECTOMY     CATARACT EXTRACTION Bilateral     2016-2017   EYE SURGERY Right 10/2021   gel leaking/laser surgery   ICD IMPLANT N/A 03/13/2019   Procedure: ICD IMPLANT;  Surgeon: Inocencio Soyla Lunger, MD;  Location: [facility] INVASIVE CV LAB;  Service: Cardiovascular;  Laterality: N/A;   ICD IMPLANT  03/13/2019   LEFT HEART CATH AND CORONARY ANGIOGRAPHY N/A 03/12/2019   Procedure: LEFT HEART CATH AND CORONARY ANGIOGRAPHY;  Surgeon: Darron Deatrice LABOR, MD;  Location: [facility] INVASIVE CV LAB;  Service: Cardiovascular;  Laterality: N/A;    Family History  Problem Relation Age of Onset   Hypertension Mother    Diabetes Mother    COPD Father    Social History   Socioeconomic History   Marital status: Widowed    Spouse  name: Not on file   Number of children: Not on file   Years of education: Not on file   Highest education level: Not on file  Occupational History   Not on file  Tobacco Use   Smoking status: Every Day    Current packs/day: 0.50    Average packs/day: 0.5 packs/day for 2.5 years (1.3 ttl pk-yrs)    Types: Cigarettes    Start date: 01/2022    Last attempt to quit: 03/2019   Smokeless tobacco: Never  Vaping Use   Vaping status: Never Used  Substance and Sexual Activity   Alcohol use: Not Currently   Drug use: Never   Sexual activity: Not on file  Other Topics Concern   Not on file  Social History Narrative   Not on file   Social Drivers of Health   Financial Resource Strain: Low Risk  (04/02/2024)   Overall Financial Resource Strain (CARDIA)    Difficulty of Paying Living Expenses: Not hard at all  Food Insecurity: No Food Insecurity (04/02/2024)   Hunger Vital Sign    Worried About Running Out of Food in the Last Year: Never true    Ran Out of Food in the Last Year: Never true  Transportation Needs: No Transportation Needs (04/02/2024)   PRAPARE - Administrator, Civil Service (Medical): No    Lack of Transportation (Non-Medical): No  Physical Activity: Sufficiently Active (04/02/2024)   Exercise Vital  Sign    Days of Exercise per Week: 3 days    Minutes of Exercise per Session: 60 min  Stress: No Stress Concern Present (04/02/2024)   [employer] of Occupational Health - Occupational Stress Questionnaire    Feeling of Stress: Not at all  Social Connections: Moderately Isolated (04/02/2024)   Social Connection and Isolation Panel    Frequency of Communication with Friends and Family: Three times a week    Frequency of Social Gatherings with Friends and Family: Three times a week    Attends Religious Services: More than 4 times per year    Active Member of Clubs or Organizations: No    Attends Banker Meetings: Never    Marital Status: Widowed    Objective:  BP 138/72   Pulse 91   Temp 98 F (36.7 C)   Ht 5' 4 (1.626 m)   Wt 182 lb (82.6 kg)   LMP  (LMP Unknown)   SpO2 94%   BMI 31.24 kg/m      07/15/2024    7:59 AM 04/02/2024    8:05 AM 12/27/2023    8:45 AM  BP/Weight  Systolic BP 138 130 138  Diastolic BP 72 60 68  Wt. (Lbs) 182 185 190  BMI 31.24 kg/m2 31.76 kg/m2 32.61 kg/m2    Physical Exam Vitals reviewed.  Constitutional:      Appearance: Normal appearance. She is normal weight.  Neck:     Vascular: No carotid bruit.  Cardiovascular:     Rate and Rhythm: Normal rate and regular rhythm.     Pulses: Normal pulses.     Heart sounds: Normal heart sounds.  Pulmonary:     Effort: Pulmonary effort is normal. No respiratory distress.     Breath sounds: Normal breath sounds.  Abdominal:     General: Abdomen is flat. Bowel sounds are normal.     Palpations: Abdomen is soft.     Tenderness: There is no abdominal tenderness.  Neurological:     Mental Status:  She is alert and oriented to person, place, and time.  Psychiatric:        Mood and Affect: Mood normal.        Behavior: Behavior normal.     {Perform Simple Foot Exam  Perform Detailed exam:1} Diabetic foot exam was performed with the following findings:   No deformities,  ulcerations, or other skin breakdown Normal sensation of 10g monofilament Intact posterior tibialis and dorsalis pedis pulses      Lab Results  Component Value Date   WBC 7.5 04/02/2024   HGB 14.6 04/02/2024   HCT 45.3 04/02/2024   PLT 251 04/02/2024   GLUCOSE 120 (H) 04/02/2024   CHOL 191 04/02/2024   TRIG 124 04/02/2024   HDL 58 04/02/2024   LDLCALC 111 (H) 04/02/2024   ALT 12 04/02/2024   AST 14 04/02/2024   NA 139 04/02/2024   K 4.2 04/02/2024   CL 99 04/02/2024   CREATININE 0.62 04/02/2024   BUN 10 04/02/2024   CO2 23 04/02/2024   TSH 2.720 12/06/2022   HGBA1C 7.3 07/15/2024    Results for orders placed or performed in visit on 07/15/24  POCT glycosylated hemoglobin (Hb A1C)   Collection Time: 07/15/24  9:09 AM  Result Value Ref Range   Hemoglobin A1C     HbA1c POC (<> result, manual entry) 7.3 4.0 - 5.6 %   HbA1c, POC (prediabetic range)     HbA1c, POC (controlled diabetic range)    POCT Lipid Panel   Collection Time: 07/15/24  9:09 AM  Result Value Ref Range   TC 164    HDL 60    TRG 104    LDL 84    Non-HDL 104    TC/HDL    Microalbumin / creatinine urine ratio   Collection Time: 07/16/24 12:00 AM  Result Value Ref Range   Creatinine, Urine 54.5 Not Estab. mg/dL   Microalbumin, Urine 61.3 Not Estab. ug/mL   Microalb/Creat Ratio 71 (H) 0 - 29 mg/g creat  Specimen status report   Collection Time: 07/16/24 12:00 AM  Result Value Ref Range   specimen status report Comment   .  Assessment & Plan:   Assessment & Plan Hypertensive heart disease with chronic systolic congestive heart failure (HCC) Hypertension managed with carvedilol  12.5 mg twice a day and losartan  100 mg once a day. Blood pressure is slightly high normal. She has stopped taking baby aspirin  due to gastrointestinal discomfort. Discussed that baby aspirin  decreases heart attack risk by 17%. - Encourage taking baby aspirin  every other day to reduce heart attack risk by 17%.    Diabetic  glomerulopathy (HCC) Type 2 Diabetes Mellitus with recent weight loss of 40 pounds since April. A1c is 6.9, indicating good glycemic control. She has reduced metformin  intake and is using insulin  based on blood glucose levels. She is also taking berberine with chromium and cinnamon, which she reports has increased her energy levels and reduced leg soreness. - Continue current insulin  regimen based on blood glucose levels. - Continue berberine with chromium and cinnamon. - Check A1c today. Orders:   POCT glycosylated hemoglobin (Hb A1C)   Microalbumin / creatinine urine ratio  Mixed hyperlipidemia Hyperlipidemia managed with black seed oil and magnesium  due to intolerance to statins and Noxafil. Awaiting current cholesterol levels. - Check cholesterol levels today. Orders:   POCT Lipid Panel  Depression, major, recurrent, mild Depression managed with Cymbalta  30 mg once a day. Lorazepam  is prescribed but used infrequently.  Other constipation Chronic constipation with bowel movements every three days without Miralax. Miralax is effective in promoting daily bowel movements. Discussed that Miralax is a safe medication for daily use. - Continue Miralax as needed for constipation.    Gastroesophageal reflux disease with esophagitis, unspecified whether hemorrhage GERD managed with omeprazole as needed.      Body mass index is 31.24 kg/m.    No orders of the defined types were placed in this encounter.   Orders Placed This Encounter  Procedures   Microalbumin / creatinine urine ratio   Specimen status report   POCT glycosylated hemoglobin (Hb A1C)   POCT Lipid Panel     I,Marla I Leal-Borjas,acting as a scribe for Abigail Free, MD.,have documented all relevant documentation on the behalf of Abigail Free, MD,as directed by  Abigail Free, MD while in the presence of Abigail Free, MD.    Follow-up: Return in about 4 months (around 11/15/2024) for chronic follow up.  An After  Visit Summary was printed and given to the patient.  Abigail Free, MD [facility] [phone]

## 2024-07-15 NOTE — Patient Instructions (Addendum)
  VISIT SUMMARY: Today, we discussed your progress with weight loss, diabetes management, hypertension, hyperlipidemia, and other health concerns. You have made significant strides in your health through lifestyle changes and medication adjustments.  YOUR PLAN: TYPE 2 DIABETES MELLITUS: Your diabetes is well-managed with a recent A1c of 6.9. You have reduced your metformin  intake and are using insulin  based on your blood glucose levels. You are also taking berberine with chromium and cinnamon, which has improved your energy levels and reduced leg soreness. -Continue your current insulin  regimen based on blood glucose levels. -Continue taking berberine with chromium and cinnamon. -We will check your A1c today.  HYPERLIPIDEMIA: You are managing your cholesterol with black seed oil and magnesium  due to intolerance to statins and Noxafil. -We will check your cholesterol levels today.  HYPERTENSION: Your blood pressure is slightly high normal and is managed with carvedilol  and losartan . You have stopped taking baby aspirin  due to gastrointestinal discomfort. -Consider taking baby aspirin  every other day to reduce heart attack risk by 17%.  DEPRESSION: Your depression is managed with Cymbalta , and you rarely need lorazepam  for anxiety. -Continue taking Cymbalta  as prescribed.  CONSTIPATION: You experience chronic constipation, which is relieved by Miralax. -Continue using Miralax as needed for constipation.  GASTROESOPHAGEAL REFLUX DISEASE (GERD): Your acid reflux is managed with omeprazole as needed. -Continue using omeprazole as needed for acid reflux.  GENERAL HEALTH MAINTENANCE: You have declined flu and COVID vaccinations but have received the shingles vaccine. We discussed skin protection to prevent skin cancer. -Wear sunscreen and protective clothing to prevent skin cancer.  FOLLOW-UP: We will schedule your next appointment for January or February. -Schedule a follow-up appointment for  January or February.  PS: I do recommend you quit smoking also. I know I did not say anything, but it is important. If you would like to quit and would like help, let me know.                      Contains text generated by Abridge.                                 Contains text generated by Abridge.

## 2024-07-15 NOTE — Assessment & Plan Note (Addendum)
 Hypertension managed with carvedilol  12.5 mg twice a day and losartan  100 mg once a day. Blood pressure is slightly high normal. She has stopped taking baby aspirin  due to gastrointestinal discomfort. Discussed that baby aspirin  decreases heart attack risk by 17%. - Encourage taking baby aspirin  every other day to reduce heart attack risk by 17%.

## 2024-07-15 NOTE — Assessment & Plan Note (Addendum)
 Type 2 Diabetes Mellitus with recent weight loss of 40 pounds since April. A1c is 6.9, indicating good glycemic control. She has reduced metformin  intake and is using insulin  based on blood glucose levels. She is also taking berberine with chromium and cinnamon, which she reports has increased her energy levels and reduced leg soreness. - Continue current insulin  regimen based on blood glucose levels. - Continue berberine with chromium and cinnamon. - Check A1c today. Orders:   POCT glycosylated hemoglobin (Hb A1C)   Microalbumin / creatinine urine ratio

## 2024-07-15 NOTE — Assessment & Plan Note (Addendum)
 Hyperlipidemia managed with black seed oil and magnesium  due to intolerance to statins and Noxafil. Awaiting current cholesterol levels. - Check cholesterol levels today. Orders:   POCT Lipid Panel

## 2024-07-16 DIAGNOSIS — E1121 Type 2 diabetes mellitus with diabetic nephropathy: Secondary | ICD-10-CM | POA: Diagnosis not present

## 2024-07-17 ENCOUNTER — Ambulatory Visit (INDEPENDENT_AMBULATORY_CARE_PROVIDER_SITE_OTHER): Payer: BC Managed Care – PPO

## 2024-07-17 DIAGNOSIS — I255 Ischemic cardiomyopathy: Secondary | ICD-10-CM

## 2024-07-17 LAB — CUP PACEART REMOTE DEVICE CHECK
Battery Remaining Longevity: 58 mo
Battery Remaining Percentage: 56 %
Battery Voltage: 2.96 V
Brady Statistic RV Percent Paced: 1 %
Date Time Interrogation Session: 20251009020017
HighPow Impedance: 75 Ohm
HighPow Impedance: 75 Ohm
Implantable Lead Connection Status: 753985
Implantable Lead Implant Date: 20200604
Implantable Lead Location: 753860
Implantable Pulse Generator Implant Date: 20200604
Lead Channel Impedance Value: 480 Ohm
Lead Channel Pacing Threshold Amplitude: 0.5 V
Lead Channel Pacing Threshold Pulse Width: 0.5 ms
Lead Channel Sensing Intrinsic Amplitude: 11.3 mV
Lead Channel Setting Pacing Amplitude: 2.5 V
Lead Channel Setting Pacing Pulse Width: 0.5 ms
Lead Channel Setting Sensing Sensitivity: 0.5 mV
Pulse Gen Serial Number: 9860889
Zone Setting Status: 755011

## 2024-07-17 NOTE — Progress Notes (Signed)
 Remote ICD Transmission

## 2024-07-18 ENCOUNTER — Ambulatory Visit: Payer: Self-pay | Admitting: Cardiology

## 2024-07-18 LAB — MICROALBUMIN / CREATININE URINE RATIO
Creatinine, Urine: 54.5 mg/dL
Microalb/Creat Ratio: 71 mg/g{creat} — ABNORMAL HIGH (ref 0–29)
Microalbumin, Urine: 38.6 ug/mL

## 2024-07-18 LAB — SPECIMEN STATUS REPORT

## 2024-07-19 DIAGNOSIS — K5909 Other constipation: Secondary | ICD-10-CM | POA: Insufficient documentation

## 2024-07-19 NOTE — Assessment & Plan Note (Signed)
 Depression managed with Cymbalta  30 mg once a day. Lorazepam  is prescribed but used infrequently.

## 2024-07-19 NOTE — Assessment & Plan Note (Signed)
 GERD managed with omeprazole as needed.

## 2024-07-19 NOTE — Assessment & Plan Note (Signed)
 Chronic constipation with bowel movements every three days without Miralax. Miralax is effective in promoting daily bowel movements. Discussed that Miralax is a safe medication for daily use. - Continue Miralax as needed for constipation.

## 2024-07-22 NOTE — Progress Notes (Signed)
 Remote ICD Transmission

## 2024-07-27 NOTE — Progress Notes (Unsigned)
  Electrophysiology Office Note:   Date:  07/28/2024  ID:  Melinda Nguyen, DOB [birth year], MRN [id number]  Primary Cardiologist: Lamar Fitch, MD Primary Heart Failure: None Electrophysiologist: Nimai Burbach Gladis Norton, MD      History of Present Illness:   Melinda Nguyen is a 63 y.o. female with h/o ventricular tachycardia, diabetes, obesity, tobacco abuse, CHF, hyperlipidemia, coronary artery disease seen today for routine electrophysiology followup.   Since last being seen in our clinic the patient reports doing well.  She has no acute complaints at this time.  She is able to do all of her daily activities without restriction.  She does note intermittent episodes of edema, though this happens rarely and only when she has been on her feet.  She notices this at the end of the day with a ring around her socks.  She is otherwise doing well from a cardiac perspective.  she denies chest pain, palpitations, dyspnea, PND, orthopnea, nausea, vomiting, dizziness, syncope, edema, weight gain, or early satiety.   Review of systems complete and found to be negative unless listed in HPI.      EP Information / Studies Reviewed:    EKG is ordered today. Personal review as below.  EKG Interpretation Date/Time:  Monday July 28 2024 08:39:49 EDT Ventricular Rate:  79 PR Interval:  148 QRS Duration:  112 QT Interval:  386 QTC Calculation: 442 R Axis:   24  Text Interpretation: Normal sinus rhythm Low voltage QRS When compared with ECG of 14-Mar-2019 07:29, ST no longer depressed in Anterior leads Nonspecific T wave abnormality no longer evident in Inferior leads T wave inversion no longer evident in Anterior leads Confirmed by Delcie Ruppert ([id number]) on 07/28/2024 8:49:05 AM   ICD Interrogation-  reviewed in detail today,  See PACEART report.  Device History: Abbott Single Chamber ICD implanted 03/13/2019 for ventricular tachycardia History of appropriate therapy: No History of AAD therapy: No   Risk  Assessment/Calculations:         Physical Exam:   VS:  BP 134/84   Pulse 79   Ht 5' 4 (1.626 m)   Wt 180 lb 3.2 oz (81.7 kg)   LMP  (LMP Unknown)   SpO2 94%   BMI 30.93 kg/m    Wt Readings from Last 3 Encounters:  07/28/24 180 lb 3.2 oz (81.7 kg)  07/15/24 182 lb (82.6 kg)  04/02/24 185 lb (83.9 kg)     GEN: Well nourished, well developed in no acute distress NECK: No JVD; No carotid bruits CARDIAC: Regular rate and rhythm, no murmurs, rubs, gallops RESPIRATORY:  Clear to auscultation without rales, wheezing or rhonchi  ABDOMEN: Soft, non-tender, non-distended EXTREMITIES:  No edema; No deformity   ASSESSMENT AND PLAN:    Ventricular tachycardia s/p Abbott single chamber ICD  euvolemic today Stable on an appropriate medical regimen Normal ICD function See Pace Art report No changes today  2.  Coronary artery disease: Occluded circumflex and the lesion in the LAD and RCA.  On carvedilol  and as needed nitroglycerin .  Plan per primary cardiology.  3.  Hypertension: Well-controlled  4.  Hyperlipidemia: Refuses therapy.  Plan per primary cardiology.  Disposition:   Follow up with EP Team in 12 months   Signed, Mairi Stagliano Gladis Norton, MD

## 2024-07-28 ENCOUNTER — Encounter: Payer: Self-pay | Admitting: Cardiology

## 2024-07-28 ENCOUNTER — Ambulatory Visit: Payer: Self-pay | Attending: Cardiology | Admitting: Cardiology

## 2024-07-28 VITALS — BP 134/84 | HR 79 | Ht 64.0 in | Wt 180.2 lb

## 2024-07-28 DIAGNOSIS — I472 Ventricular tachycardia, unspecified: Secondary | ICD-10-CM | POA: Diagnosis not present

## 2024-07-28 DIAGNOSIS — I2510 Atherosclerotic heart disease of native coronary artery without angina pectoris: Secondary | ICD-10-CM | POA: Diagnosis not present

## 2024-07-28 DIAGNOSIS — I10 Essential (primary) hypertension: Secondary | ICD-10-CM | POA: Diagnosis not present

## 2024-07-28 LAB — CUP PACEART INCLINIC DEVICE CHECK
Battery Remaining Longevity: 57 mo
Brady Statistic RV Percent Paced: 0 %
Date Time Interrogation Session: 20251020084708
HighPow Impedance: 76.5 Ohm
Implantable Lead Connection Status: 753985
Implantable Lead Implant Date: 20200604
Implantable Lead Location: 753860
Implantable Pulse Generator Implant Date: 20200604
Lead Channel Impedance Value: 437.5 Ohm
Lead Channel Pacing Threshold Amplitude: 1 V
Lead Channel Pacing Threshold Amplitude: 1 V
Lead Channel Pacing Threshold Pulse Width: 0.5 ms
Lead Channel Pacing Threshold Pulse Width: 0.5 ms
Lead Channel Sensing Intrinsic Amplitude: 11.3 mV
Lead Channel Setting Pacing Amplitude: 2.5 V
Lead Channel Setting Pacing Pulse Width: 0.5 ms
Lead Channel Setting Sensing Sensitivity: 0.5 mV
Pulse Gen Serial Number: 9860889
Zone Setting Status: 755011

## 2024-07-28 NOTE — Patient Instructions (Signed)
 Medication Instructions:  Your physician recommends that you continue on your current medications as directed. Please refer to the Current Medication list given to you today.  *If you need a refill on your cardiac medications before your next appointment, please call your pharmacy*  Lab Work: None ordered   Testing/Procedures: None ordered  Follow-Up: At [facility], you and your health needs are our priority.  As part of our continuing mission to provide you with exceptional heart care, our providers are all part of one team.  This team includes your primary Cardiologist (physician) and Advanced Practice Providers or APPs (Physician Assistants and Nurse Practitioners) who all work together to provide you with the care you need, when you need it.  Your physician recommends that you schedule a follow-up appointment in: 6 months with Dr. Bernie  Your next appointment:   1 year(s)  Provider:   Soyla Norton, MD     Thank you for choosing [facility]!!   Maeola Domino, RN [phone]

## 2024-08-05 ENCOUNTER — Other Ambulatory Visit: Payer: Self-pay | Admitting: Cardiology

## 2024-08-12 ENCOUNTER — Telehealth: Payer: Self-pay | Admitting: *Deleted

## 2024-08-12 NOTE — Telephone Encounter (Signed)
   Pre-operative Risk Assessment    Patient Name: Melinda Nguyen  DOB: 03-09-1961 MRN: [id number]     Request for Surgical Clearance    Procedure:  Dental Extraction - Amount of Teeth to be Pulled:  1  Date of Surgery:  Clearance TBD                                 Surgeon:  Not Indicated Surgeon's Group or Practice Name:  [facility] Phone number:  [phone] Fax number:  [phone]   Type of Clearance Requested:   - Medical  - Pharmacy:  Hold Aspirin  Does the pt need to stop and aspirin  and if so for how long   Type of Anesthesia:  Local or conscious sedation   Additional requests/questions:  Does this patient need antibiotics? Please fax a copy of A1C level and recent hemoglobin lab work to the surgeon's office.  Signed, Arloa Donovan Dines   08/12/2024, 2:22 PM

## 2024-08-12 NOTE — Telephone Encounter (Signed)
   Patient Name: Melinda Nguyen  DOB: January 14, 1961 MRN: [id number]  Primary Cardiologist: Lamar Fitch, MD  Chart reviewed as part of pre-operative protocol coverage.   Simple dental extractions (i.e. 1-2 teeth) are considered low risk procedures per guidelines and generally do not require any specific cardiac clearance. It is also generally accepted that for simple extractions and dental cleanings, there is no need to interrupt blood thinner therapy, and she can continue her aspirin .  SBE prophylaxis is not required for the patient from a cardiac standpoint.  I will route this recommendation to the requesting party via Epic fax function and remove from pre-op pool.  Please call with questions.  Delon Hoover DNP, AGNP-BC  08/12/2024, 3:32 PM [address]  [place], [place] [id number] Office [phone] Fax [phone]

## 2024-08-18 NOTE — Telephone Encounter (Unsigned)
 Copied from CRM [phone]. Topic: Medical Record Request - Other >> Aug 18, 2024  9:50 AM Robinson H wrote: Reason for CRM: Patient is checking the status of her oral surgery clearance, states the dentist is still waiting on clearance from Dr. Sherre as of 11/6 before patient  can have surgery. Patient is scheduled for surgery on Thursday 11/13. States the dentist has also sent a request. Please call patient to let know  Kashvi [phone]

## 2024-08-19 ENCOUNTER — Telehealth: Payer: Self-pay

## 2024-08-19 NOTE — Telephone Encounter (Signed)
DONE. DR. Tobie Poet

## 2024-08-19 NOTE — Telephone Encounter (Signed)
 Called office and tried to talk with arianne, but she was  busy. Left message to fax the form.  Copied from CRM [id number]. Topic: General - Other >> Aug 18, 2024 11:22 AM Dedra B wrote: Reason for CRM: Arianne from the [facility] is calling regarding a medical clearance form that was faxed over on 08/08/24. She needs the pt's last A1c and the form faxed to [phone]. If any questions, Arianne can be reached at [phone].

## 2024-09-02 ENCOUNTER — Other Ambulatory Visit: Payer: Self-pay | Admitting: Family Medicine

## 2024-09-02 ENCOUNTER — Telehealth: Payer: Self-pay

## 2024-09-02 MED ORDER — SCOPOLAMINE 1 MG/3DAYS TD PT72: 1.0000 | MEDICATED_PATCH | TRANSDERMAL | 1 refills | Status: AC

## 2024-09-02 NOTE — Telephone Encounter (Signed)
 Copied from CRM [id number]. Topic: Clinical - Medication Question >> Sep 01, 2024 11:37 AM Tonda B wrote: Reason for CRM: patient is calling to see if she can get some patches for sea sickness [phone] (M)

## 2024-09-16 ENCOUNTER — Other Ambulatory Visit: Payer: Self-pay | Admitting: Family Medicine

## 2024-09-16 DIAGNOSIS — Z794 Long term (current) use of insulin: Secondary | ICD-10-CM

## 2024-10-06 ENCOUNTER — Other Ambulatory Visit: Payer: Self-pay | Admitting: Family Medicine

## 2024-10-16 ENCOUNTER — Ambulatory Visit: Payer: BC Managed Care – PPO

## 2024-10-16 DIAGNOSIS — I472 Ventricular tachycardia, unspecified: Secondary | ICD-10-CM

## 2024-10-17 ENCOUNTER — Ambulatory Visit: Payer: Self-pay | Admitting: Cardiology

## 2024-10-17 LAB — CUP PACEART REMOTE DEVICE CHECK
Battery Remaining Longevity: 55 mo
Battery Remaining Percentage: 54 %
Battery Voltage: 2.95 V
Brady Statistic RV Percent Paced: 0 %
Date Time Interrogation Session: 20260108020016
HighPow Impedance: 80 Ohm
HighPow Impedance: 80 Ohm
Implantable Lead Connection Status: 753985
Implantable Lead Implant Date: 20200604
Implantable Lead Location: 753860
Implantable Pulse Generator Implant Date: 20200604
Lead Channel Impedance Value: 510 Ohm
Lead Channel Pacing Threshold Amplitude: 1 V
Lead Channel Pacing Threshold Pulse Width: 0.5 ms
Lead Channel Sensing Intrinsic Amplitude: 11.3 mV
Lead Channel Setting Pacing Amplitude: 2.5 V
Lead Channel Setting Pacing Pulse Width: 0.5 ms
Lead Channel Setting Sensing Sensitivity: 0.5 mV
Pulse Gen Serial Number: 9860889
Zone Setting Status: 755011

## 2024-10-21 NOTE — Progress Notes (Signed)
 Remote ICD Transmission

## 2024-11-17 ENCOUNTER — Ambulatory Visit: Admitting: Family Medicine

## 2024-12-15 ENCOUNTER — Ambulatory Visit: Admitting: Family Medicine

## 2024-12-22 ENCOUNTER — Ambulatory Visit: Payer: Self-pay | Admitting: Family Medicine

## 2025-01-15 ENCOUNTER — Ambulatory Visit: Payer: Self-pay

## 2025-04-16 ENCOUNTER — Ambulatory Visit: Payer: Self-pay

## 2025-07-16 ENCOUNTER — Ambulatory Visit: Payer: Self-pay

## 2025-10-15 ENCOUNTER — Ambulatory Visit: Payer: Self-pay

## 2026-01-14 ENCOUNTER — Ambulatory Visit: Payer: Self-pay
